# Patient Record
Sex: Male | Born: 1950 | Race: White | Hispanic: No | Marital: Married | Smoking: Never smoker
Health system: Southern US, Community
[De-identification: ages and names within clinical notes are randomized; demographics above are authoritative.]

## PROBLEM LIST (undated history)

## (undated) DIAGNOSIS — I10 Essential (primary) hypertension: Secondary | ICD-10-CM

## (undated) DIAGNOSIS — E039 Hypothyroidism, unspecified: Secondary | ICD-10-CM

## (undated) DIAGNOSIS — I2510 Atherosclerotic heart disease of native coronary artery without angina pectoris: Secondary | ICD-10-CM

## (undated) DIAGNOSIS — R0600 Dyspnea, unspecified: Secondary | ICD-10-CM

## (undated) DIAGNOSIS — M4800 Spinal stenosis, site unspecified: Secondary | ICD-10-CM

## (undated) DIAGNOSIS — E785 Hyperlipidemia, unspecified: Secondary | ICD-10-CM

## (undated) DIAGNOSIS — K219 Gastro-esophageal reflux disease without esophagitis: Secondary | ICD-10-CM

## (undated) DIAGNOSIS — I5042 Chronic combined systolic (congestive) and diastolic (congestive) heart failure: Secondary | ICD-10-CM

## (undated) DIAGNOSIS — R001 Bradycardia, unspecified: Secondary | ICD-10-CM

## (undated) DIAGNOSIS — M545 Low back pain, unspecified: Secondary | ICD-10-CM

## (undated) DIAGNOSIS — Z8619 Personal history of other infectious and parasitic diseases: Secondary | ICD-10-CM

## (undated) DIAGNOSIS — C61 Malignant neoplasm of prostate: Secondary | ICD-10-CM

## (undated) DIAGNOSIS — T50905A Adverse effect of unspecified drugs, medicaments and biological substances, initial encounter: Secondary | ICD-10-CM

## (undated) DIAGNOSIS — G8929 Other chronic pain: Secondary | ICD-10-CM

## (undated) DIAGNOSIS — M4316 Spondylolisthesis, lumbar region: Secondary | ICD-10-CM

## (undated) DIAGNOSIS — M419 Scoliosis, unspecified: Secondary | ICD-10-CM

## (undated) DIAGNOSIS — G4733 Obstructive sleep apnea (adult) (pediatric): Secondary | ICD-10-CM

## (undated) DIAGNOSIS — R0609 Other forms of dyspnea: Secondary | ICD-10-CM

## (undated) DIAGNOSIS — M1990 Unspecified osteoarthritis, unspecified site: Secondary | ICD-10-CM

## (undated) DIAGNOSIS — Q2546 Tortuous aortic arch: Secondary | ICD-10-CM

## (undated) DIAGNOSIS — I493 Ventricular premature depolarization: Secondary | ICD-10-CM

## (undated) DIAGNOSIS — J219 Acute bronchiolitis, unspecified: Secondary | ICD-10-CM

## (undated) HISTORY — DX: Spinal stenosis, site unspecified: M48.00

## (undated) HISTORY — PX: BACK SURGERY: SHX140

## (undated) HISTORY — PX: ESOPHAGOGASTRODUODENOSCOPY: SHX1529

## (undated) HISTORY — DX: Bradycardia, unspecified: R00.1

## (undated) HISTORY — DX: Scoliosis, unspecified: M41.9

## (undated) HISTORY — DX: Dyspnea, unspecified: R06.00

## (undated) HISTORY — PX: INGUINAL HERNIA REPAIR: SUR1180

## (undated) HISTORY — PX: ANGIOPLASTY: SHX39

## (undated) HISTORY — DX: Spondylolisthesis, lumbar region: M43.16

## (undated) HISTORY — PX: TONSILLECTOMY: SUR1361

## (undated) HISTORY — DX: Adverse effect of unspecified drugs, medicaments and biological substances, initial encounter: T50.905A

## (undated) HISTORY — PX: COLONOSCOPY: SHX174

## (undated) HISTORY — PX: EYE SURGERY: SHX253

## (undated) HISTORY — DX: Ventricular premature depolarization: I49.3

## (undated) HISTORY — PX: JOINT REPLACEMENT: SHX530

## (undated) HISTORY — DX: Chronic combined systolic (congestive) and diastolic (congestive) heart failure: I50.42

## (undated) HISTORY — DX: Other chronic pain: G89.29

## (undated) HISTORY — DX: Obstructive sleep apnea (adult) (pediatric): G47.33

## (undated) HISTORY — DX: Other forms of dyspnea: R06.09

---

## 2003-08-30 HISTORY — PX: CYST EXCISION: SHX5701

## 2004-10-04 ENCOUNTER — Ambulatory Visit: Payer: Self-pay | Admitting: Family Medicine

## 2004-11-25 ENCOUNTER — Ambulatory Visit: Payer: Self-pay | Admitting: Family Medicine

## 2005-02-03 ENCOUNTER — Ambulatory Visit: Payer: Self-pay | Admitting: Family Medicine

## 2005-06-10 ENCOUNTER — Ambulatory Visit: Payer: Self-pay | Admitting: Family Medicine

## 2005-10-11 ENCOUNTER — Ambulatory Visit: Payer: Self-pay | Admitting: Family Medicine

## 2005-11-29 ENCOUNTER — Ambulatory Visit: Payer: Self-pay | Admitting: Family Medicine

## 2006-02-07 ENCOUNTER — Ambulatory Visit: Payer: Self-pay | Admitting: Family Medicine

## 2006-06-14 ENCOUNTER — Ambulatory Visit: Payer: Self-pay | Admitting: Family Medicine

## 2006-06-22 ENCOUNTER — Ambulatory Visit: Payer: Self-pay | Admitting: Family Medicine

## 2006-06-26 ENCOUNTER — Ambulatory Visit: Payer: Self-pay | Admitting: Family Medicine

## 2006-10-16 ENCOUNTER — Ambulatory Visit: Payer: Self-pay | Admitting: Family Medicine

## 2006-12-11 ENCOUNTER — Ambulatory Visit: Payer: Self-pay | Admitting: Family Medicine

## 2007-01-11 ENCOUNTER — Ambulatory Visit: Payer: Self-pay | Admitting: Family Medicine

## 2007-01-25 ENCOUNTER — Ambulatory Visit: Payer: Self-pay | Admitting: Family Medicine

## 2008-03-29 ENCOUNTER — Emergency Department (HOSPITAL_COMMUNITY): Admission: EM | Admit: 2008-03-29 | Discharge: 2008-03-29 | Payer: Self-pay | Admitting: Family Medicine

## 2009-02-17 ENCOUNTER — Encounter: Admission: RE | Admit: 2009-02-17 | Discharge: 2009-02-17 | Payer: Self-pay | Admitting: Neurosurgery

## 2009-07-04 ENCOUNTER — Emergency Department (HOSPITAL_COMMUNITY): Admission: EM | Admit: 2009-07-04 | Discharge: 2009-07-04 | Payer: Self-pay | Admitting: Emergency Medicine

## 2010-08-29 HISTORY — PX: KNEE ARTHROSCOPY: SHX127

## 2010-12-01 LAB — CBC
Hemoglobin: 11.5 g/dL — ABNORMAL LOW (ref 13.0–17.0)
MCHC: 31.8 g/dL (ref 30.0–36.0)
RDW: 17.7 % — ABNORMAL HIGH (ref 11.5–15.5)

## 2010-12-01 LAB — DIFFERENTIAL
Basophils Absolute: 0 10*3/uL (ref 0.0–0.1)
Basophils Relative: 1 % (ref 0–1)
Lymphocytes Relative: 10 % — ABNORMAL LOW (ref 12–46)
Monocytes Absolute: 0.9 10*3/uL (ref 0.1–1.0)
Neutro Abs: 5.2 10*3/uL (ref 1.7–7.7)
Neutrophils Relative %: 75 % (ref 43–77)

## 2010-12-01 LAB — URINALYSIS, ROUTINE W REFLEX MICROSCOPIC
Nitrite: NEGATIVE
Specific Gravity, Urine: 1.037 — ABNORMAL HIGH (ref 1.005–1.030)
Urobilinogen, UA: 0.2 mg/dL (ref 0.0–1.0)
pH: 5.5 (ref 5.0–8.0)

## 2010-12-01 LAB — POCT I-STAT, CHEM 8
BUN: 22 mg/dL (ref 6–23)
Calcium, Ion: 1.16 mmol/L (ref 1.12–1.32)
Chloride: 108 mEq/L (ref 96–112)
Creatinine, Ser: 1 mg/dL (ref 0.4–1.5)
Glucose, Bld: 85 mg/dL (ref 70–99)
TCO2: 21 mmol/L (ref 0–100)

## 2010-12-01 LAB — URINE CULTURE: Colony Count: NO GROWTH

## 2010-12-01 LAB — GLUCOSE, CAPILLARY: Glucose-Capillary: 81 mg/dL (ref 70–99)

## 2011-08-30 HISTORY — PX: PROSTATE SURGERY: SHX751

## 2011-08-30 HISTORY — PX: PROSTATE BIOPSY: SHX241

## 2011-12-29 ENCOUNTER — Emergency Department (HOSPITAL_COMMUNITY)
Admission: EM | Admit: 2011-12-29 | Discharge: 2011-12-29 | Disposition: A | Discharge status: Home / Self Care | Payer: BC Managed Care – PPO | Attending: Emergency Medicine | Admitting: Emergency Medicine

## 2011-12-29 ENCOUNTER — Encounter (HOSPITAL_COMMUNITY): Payer: Self-pay | Admitting: Emergency Medicine

## 2011-12-29 DIAGNOSIS — R1031 Right lower quadrant pain: Secondary | ICD-10-CM | POA: Insufficient documentation

## 2011-12-29 DIAGNOSIS — C61 Malignant neoplasm of prostate: Secondary | ICD-10-CM | POA: Insufficient documentation

## 2011-12-29 DIAGNOSIS — I10 Essential (primary) hypertension: Secondary | ICD-10-CM | POA: Insufficient documentation

## 2011-12-29 HISTORY — DX: Malignant neoplasm of prostate: C61

## 2011-12-29 HISTORY — DX: Essential (primary) hypertension: I10

## 2011-12-29 LAB — DIFFERENTIAL
Basophils Absolute: 0 10*3/uL (ref 0.0–0.1)
Eosinophils Absolute: 0.1 10*3/uL (ref 0.0–0.7)
Eosinophils Relative: 2 % (ref 0–5)
Lymphocytes Relative: 29 % (ref 12–46)
Lymphs Abs: 2 10*3/uL (ref 0.7–4.0)
Monocytes Absolute: 0.7 10*3/uL (ref 0.1–1.0)

## 2011-12-29 LAB — COMPREHENSIVE METABOLIC PANEL
CO2: 24 mEq/L (ref 19–32)
Calcium: 9.8 mg/dL (ref 8.4–10.5)
Creatinine, Ser: 0.92 mg/dL (ref 0.50–1.35)
GFR calc Af Amer: >90 mL/min (ref >90)
GFR calc non Af Amer: 89 mL/min — ABNORMAL LOW (ref >90)
Glucose, Bld: 99 mg/dL (ref 70–99)
Sodium: 142 mEq/L (ref 135–145)
Total Protein: 7.5 g/dL (ref 6.0–8.3)

## 2011-12-29 LAB — CBC
HCT: 45 % (ref 39.0–52.0)
MCH: 30.1 pg (ref 26.0–34.0)
MCV: 87.4 fL (ref 78.0–100.0)
Platelets: 178 10*3/uL (ref 150–400)
RDW: 14.3 % (ref 11.5–15.5)
WBC: 6.7 10*3/uL (ref 4.0–10.5)

## 2011-12-29 LAB — URINALYSIS, ROUTINE W REFLEX MICROSCOPIC
Leukocytes, UA: NEGATIVE
Nitrite: NEGATIVE
Protein, ur: NEGATIVE mg/dL
Specific Gravity, Urine: 1.019 (ref 1.005–1.030)
Urobilinogen, UA: 0.2 mg/dL (ref 0.0–1.0)

## 2011-12-29 MED ORDER — HYDROCODONE-ACETAMINOPHEN 5-325 MG PO TABS: 1.0000 | ORAL_TABLET | Freq: Four times a day (QID) | ORAL | Status: AC | PRN

## 2011-12-29 MED ORDER — METHOCARBAMOL 500 MG PO TABS: 500.0000 mg | ORAL_TABLET | Freq: Two times a day (BID) | ORAL | Status: AC

## 2011-12-29 MED ORDER — KETOROLAC TROMETHAMINE 30 MG/ML IJ SOLN
30.0000 mg | Freq: Once | INTRAMUSCULAR | Status: AC
  Administered 2011-12-29: 30 mg via INTRAVENOUS
  Filled 2011-12-29: qty 1

## 2011-12-29 MED ORDER — ONDANSETRON HCL 4 MG/2ML IJ SOLN
4.0000 mg | Freq: Once | INTRAMUSCULAR | Status: AC
  Administered 2011-12-29: 4 mg via INTRAVENOUS
  Filled 2011-12-29: qty 2

## 2011-12-29 MED ORDER — SODIUM CHLORIDE 0.9 % IV BOLUS (SEPSIS)
1000.0000 mL | Freq: Once | INTRAVENOUS | Status: AC
  Administered 2011-12-29: 1000 mL via INTRAVENOUS

## 2011-12-29 MED ORDER — FENTANYL CITRATE 0.05 MG/ML IJ SOLN
50.0000 ug | Freq: Once | INTRAMUSCULAR | Status: AC
  Administered 2011-12-29: 50 ug via INTRAVENOUS
  Filled 2011-12-29: qty 2

## 2011-12-29 NOTE — ED Provider Notes (Signed)
History     CSN: [id number]  Arrival date & time 12/29/11  0731   First MD Initiated Contact with Patient 12/29/11 0740      8:00 AM HPI Patient reports right lower quadrant pain has been going on for several years. States pain has gradually worsened and last night pain became unbearable. States pain located in a specific spot in his lateral right lower quadrant of his abdomen. Pain is worse with palpation. Denies associated nausea, vomiting, diarrhea, fevers, urinary symptoms, diarrhea, back pain. Patient reports a history of prostate cancer; diagnosed one month ago. Reports he has not had a colonoscopy in several years and is due to have one. His significant weight loss or loss of appetite. Patient is a 61 y.o. male presenting with abdominal pain. The history is provided by the patient.  Abdominal Pain The primary symptoms of the illness include abdominal pain. The primary symptoms of the illness do not include fever, shortness of breath, nausea, vomiting, diarrhea, dysuria, vaginal discharge or vaginal bleeding. The onset of the illness was gradual. The problem has been gradually worsening.  The abdominal pain is located in the RLQ. The abdominal pain does not radiate. The abdominal pain is relieved by nothing. Exacerbated by: palpation.  The patient has not had a change in bowel habit. Symptoms associated with the illness do not include chills, heartburn, constipation, urgency, hematuria, frequency or back pain. Significant associated medical issues do not include PUD or GERD.    Past Medical History  Diagnosis Date  . Prostate cancer   . Hypertension     Past Surgical History  Procedure Date  . Knee surgery     No family history on file.  History  Substance Use Topics  . Smoking status: Never Smoker   . Smokeless tobacco: Not on file  . Alcohol Use: No      Review of Systems  Constitutional: Negative for fever and chills.  Respiratory: Negative for shortness of breath.     Cardiovascular: Negative for chest pain.  Gastrointestinal: Positive for abdominal pain. Negative for heartburn, nausea, vomiting, diarrhea, constipation, blood in stool and rectal pain.  Genitourinary: Negative for dysuria, urgency, frequency, hematuria, flank pain, discharge, vaginal bleeding, vaginal discharge, penile pain and testicular pain.  Musculoskeletal: Negative for back pain.  Neurological: Negative for dizziness, weakness, numbness and headaches.  All other systems reviewed and are negative.    Allergies  Review of patient's allergies indicates no known allergies.  Home Medications   Current Outpatient Rx  Name Route Sig Dispense Refill  . ACETAMINOPHEN-CODEINE #3 300-30 MG PO TABS Oral Take 1 tablet by mouth 2 (two) times daily.    [name] AMLODIPINE BESYLATE 5 MG PO TABS Oral Take 5 mg by mouth daily.    . ASPIRIN 81 MG PO CHEW Oral Chew 81 mg by mouth daily.    [name] COENZYME Q10 30 MG PO CAPS Oral Take 30 mg by mouth 3 (three) times daily.    [name] ESOMEPRAZOLE MAGNESIUM 40 MG PO CPDR Oral Take 40 mg by mouth daily before breakfast.    . FLAX SEED OIL PO Oral Take 3,600 mg by mouth daily.    [name] GABAPENTIN 300 MG PO CAPS Oral Take 300-600 mg by mouth 3 (three) times daily. 300 mg twice daily, then 600 mg at bedtime    . HYPROMELLOSE 2.5 % OP SOLN  1 drop.    [name] LEVOTHYROXINE SODIUM 150 MCG PO TABS Oral Take 150 mcg by mouth daily.    [name]  PITAVASTATIN CALCIUM 2 MG PO TABS Oral Take 1 mg by mouth daily.    . RED YEAST RICE 600 MG PO TABS Oral Take 1,800 mg by mouth daily.    . TRAMADOL HCL 50 MG PO TABS Oral Take 50 mg by mouth every 6 (six) hours as needed. For pain      BP 129/81  Pulse 74  Temp 97.7 F (36.5 C)  Resp 18  Ht 5\' 7"  (1.702 m)  Wt 166 lb (75.297 kg)  BMI 26.00 kg/m2  SpO2 97%  Physical Exam  Constitutional: He is oriented to person, place, and time. He appears well-developed and well-nourished.  HENT:  Head: Normocephalic and atraumatic.  Eyes: Conjunctivae  are normal. Pupils are equal, round, and reactive to light.  Neck: Normal range of motion. Neck supple.  Cardiovascular: Normal rate, regular rhythm and normal heart sounds.   Pulmonary/Chest: Effort normal and breath sounds normal.  Abdominal: Soft. Bowel sounds are normal. There is no hepatosplenomegaly. There is tenderness in the right lower quadrant. There is no rigidity, no rebound, no guarding, no CVA tenderness, no tenderness at McBurney's point and negative Murphy's sign.    Neurological: He is alert and oriented to person, place, and time.  Skin: Skin is warm and dry. No rash noted. No erythema. No pallor.  Psychiatric: He has a normal mood and affect. His behavior is normal.    ED Course  Procedures Results for orders placed during the hospital encounter of 12/29/11  CBC      Component Value Range   WBC 6.7  4.0 - 10.5 (K/uL)   RBC 5.15  4.22 - 5.81 (MIL/uL)   Hemoglobin 15.5  13.0 - 17.0 (g/dL)   HCT 16.1  09.6 - 04.5 (%)   MCV 87.4  78.0 - 100.0 (fL)   MCH 30.1  26.0 - 34.0 (pg)   MCHC 34.4  30.0 - 36.0 (g/dL)   RDW 40.9  81.1 - 91.4 (%)   Platelets 178  150 - 400 (K/uL)  DIFFERENTIAL      Component Value Range   Neutrophils Relative 57  43 - 77 (%)   Neutro Abs 3.8  1.7 - 7.7 (K/uL)   Lymphocytes Relative 29  12 - 46 (%)   Lymphs Abs 2.0  0.7 - 4.0 (K/uL)   Monocytes Relative 11  3 - 12 (%)   Monocytes Absolute 0.7  0.1 - 1.0 (K/uL)   Eosinophils Relative 2  0 - 5 (%)   Eosinophils Absolute 0.1  0.0 - 0.7 (K/uL)   Basophils Relative 0  0 - 1 (%)   Basophils Absolute 0.0  0.0 - 0.1 (K/uL)  COMPREHENSIVE METABOLIC PANEL      Component Value Range   Sodium 142  135 - 145 (mEq/L)   Potassium 4.4  3.5 - 5.1 (mEq/L)   Chloride 107  96 - 112 (mEq/L)   CO2 24  19 - 32 (mEq/L)   Glucose, Bld 99  70 - 99 (mg/dL)   BUN 19  6 - 23 (mg/dL)   Creatinine, Ser 7.82  0.50 - 1.35 (mg/dL)   Calcium 9.8  8.4 - 95.6 (mg/dL)   Total Protein 7.5  6.0 - 8.3 (g/dL)   Albumin 3.9   3.5 - 5.2 (g/dL)   AST 19  0 - 37 (U/L)   ALT 19  0 - 53 (U/L)   Alkaline Phosphatase 59  39 - 117 (U/L)   Total Bilirubin 0.9  0.3 - 1.2 (  mg/dL)   GFR calc non Af Amer 89 (*) >90 (mL/min)   GFR calc Af Amer >90  >90 (mL/min)  LIPASE, BLOOD      Component Value Range   Lipase 60 (*) 11 - 59 (U/L)  URINALYSIS, ROUTINE W REFLEX MICROSCOPIC      Component Value Range   Color, Urine YELLOW  YELLOW    APPearance CLEAR  CLEAR    Specific Gravity, Urine 1.019  1.005 - 1.030    pH 6.0  5.0 - 8.0    Glucose, UA NEGATIVE  NEGATIVE (mg/dL)   Hgb urine dipstick NEGATIVE  NEGATIVE    Bilirubin Urine NEGATIVE  NEGATIVE    Ketones, ur NEGATIVE  NEGATIVE (mg/dL)   Protein, ur NEGATIVE  NEGATIVE (mg/dL)   Urobilinogen, UA 0.2  0.0 - 1.0 (mg/dL)   Nitrite NEGATIVE  NEGATIVE    Leukocytes, UA NEGATIVE  NEGATIVE    No results found.     MDM   11:11 AM Patient reports pain is significantly improved after medication. Labs are all within normal limits. Do not suspect acute cause of abdominal pain his pain has been ongoing for several years. However did recommend patient should followup with the GI physician have a repeat colonoscopy and rule out colon cancer. Patient voices understanding and will follow up his primary care doctor to schedule GI followup. I advised patient that I would also write a referral for GI physician. Patient voices understanding and is ready for discharge.     Thomasene Lot, PA-C 12/29/11 1112

## 2011-12-29 NOTE — ED Provider Notes (Signed)
Medical screening examination/treatment/procedure(s) were performed by non-physician practitioner and as supervising physician I was immediately available for consultation/collaboration.   Carleene Cooper III, MD 12/29/11 [phone]

## 2011-12-29 NOTE — ED Notes (Signed)
Pt reports right sided abd pain X several years, reports pain is worse recently, also reports recent prostate cancer dx, no V/D/F, c/o some dysuria, NAD

## 2011-12-29 NOTE — Discharge Instructions (Signed)

## 2012-01-04 ENCOUNTER — Other Ambulatory Visit (HOSPITAL_COMMUNITY): Payer: Self-pay | Admitting: Family Medicine

## 2012-01-04 DIAGNOSIS — R1031 Right lower quadrant pain: Secondary | ICD-10-CM

## 2012-01-04 DIAGNOSIS — C61 Malignant neoplasm of prostate: Secondary | ICD-10-CM

## 2012-01-06 ENCOUNTER — Encounter (HOSPITAL_COMMUNITY): Payer: Self-pay

## 2012-01-06 ENCOUNTER — Ambulatory Visit (HOSPITAL_COMMUNITY)
Admission: RE | Admit: 2012-01-06 | Discharge: 2012-01-06 | Disposition: A | Discharge status: Home / Self Care | Payer: BC Managed Care – PPO | Source: Ambulatory Visit | Attending: Family Medicine | Admitting: Family Medicine

## 2012-01-06 DIAGNOSIS — R1031 Right lower quadrant pain: Secondary | ICD-10-CM

## 2012-01-06 DIAGNOSIS — K4090 Unilateral inguinal hernia, without obstruction or gangrene, not specified as recurrent: Secondary | ICD-10-CM | POA: Insufficient documentation

## 2012-01-06 DIAGNOSIS — C61 Malignant neoplasm of prostate: Secondary | ICD-10-CM | POA: Insufficient documentation

## 2012-01-06 DIAGNOSIS — K5730 Diverticulosis of large intestine without perforation or abscess without bleeding: Secondary | ICD-10-CM | POA: Insufficient documentation

## 2012-01-06 DIAGNOSIS — R1011 Right upper quadrant pain: Secondary | ICD-10-CM | POA: Insufficient documentation

## 2012-01-06 DIAGNOSIS — R911 Solitary pulmonary nodule: Secondary | ICD-10-CM | POA: Insufficient documentation

## 2012-01-06 MED ORDER — IOHEXOL 300 MG/ML  SOLN
100.0000 mL | Freq: Once | INTRAMUSCULAR | Status: AC | PRN
  Administered 2012-01-06: 100 mL via INTRAVENOUS

## 2013-01-17 ENCOUNTER — Other Ambulatory Visit: Payer: Self-pay | Admitting: Neurological Surgery

## 2013-02-26 HISTORY — PX: POSTERIOR LUMBAR FUSION: SHX6036

## 2013-02-27 ENCOUNTER — Encounter (HOSPITAL_COMMUNITY): Payer: Self-pay | Admitting: Pharmacy Technician

## 2013-03-05 ENCOUNTER — Encounter (HOSPITAL_COMMUNITY)
Admission: RE | Admit: 2013-03-05 | Discharge: 2013-03-05 | Disposition: A | Discharge status: Home / Self Care | Payer: BC Managed Care – PPO | Source: Ambulatory Visit | Attending: Anesthesiology | Admitting: Anesthesiology

## 2013-03-05 ENCOUNTER — Encounter (HOSPITAL_COMMUNITY)
Admission: RE | Admit: 2013-03-05 | Discharge: 2013-03-05 | Disposition: A | Discharge status: Home / Self Care | Payer: BC Managed Care – PPO | Source: Ambulatory Visit | Attending: Neurological Surgery | Admitting: Neurological Surgery

## 2013-03-05 ENCOUNTER — Encounter (HOSPITAL_COMMUNITY): Payer: Self-pay

## 2013-03-05 DIAGNOSIS — Z01818 Encounter for other preprocedural examination: Secondary | ICD-10-CM | POA: Insufficient documentation

## 2013-03-05 DIAGNOSIS — Z01812 Encounter for preprocedural laboratory examination: Secondary | ICD-10-CM | POA: Insufficient documentation

## 2013-03-05 HISTORY — DX: Gastro-esophageal reflux disease without esophagitis: K21.9

## 2013-03-05 HISTORY — DX: Hyperlipidemia, unspecified: E78.5

## 2013-03-05 HISTORY — DX: Hypothyroidism, unspecified: E03.9

## 2013-03-05 HISTORY — DX: Unspecified osteoarthritis, unspecified site: M19.90

## 2013-03-05 HISTORY — DX: Personal history of other infectious and parasitic diseases: Z86.19

## 2013-03-05 LAB — SURGICAL PCR SCREEN
MRSA, PCR: NEGATIVE
Staphylococcus aureus: NEGATIVE

## 2013-03-05 LAB — BASIC METABOLIC PANEL
CO2: 28 mEq/L (ref 19–32)
Chloride: 106 mEq/L (ref 96–112)
GFR calc Af Amer: >90 mL/min (ref >90)
Sodium: 140 mEq/L (ref 135–145)

## 2013-03-05 LAB — CBC
Platelets: 175 10*3/uL (ref 150–400)
RBC: 5.03 MIL/uL (ref 4.22–5.81)
RDW: 13 % (ref 11.5–15.5)
WBC: 6.1 10*3/uL (ref 4.0–10.5)

## 2013-03-05 LAB — ABO/RH: ABO/RH(D): A POS

## 2013-03-05 LAB — TYPE AND SCREEN
ABO/RH(D): A POS
Antibody Screen: NEGATIVE

## 2013-03-05 NOTE — Progress Notes (Signed)
Pt doesn't have a cardiologist but did see one at least 61yrs ago bc of EKG "looking different"  Echo done about 30yrs ago  Denies ever having a heart cath or stress test  Dr.Nyland is Medical MD  EKG to be requested from Dr.Nyland  Denies CXR in past yr

## 2013-03-05 NOTE — Pre-Procedure Instructions (Signed)
Duane Reyes  03/05/2013   Your procedure is scheduled on:  Tues, July 15 @ 7:30 AM  Report to [facility] at 5:30 AM.  Call this number if you have problems the morning of surgery: [phone]   Remember:   Do not eat food or drink liquids after midnight.   Take these medicines the morning of surgery with A SIP OF WATER: Amlodipine(Norvasc),Nexium(Esomeprazole),Gabapentin(Neurontin),Pain Pill(if needed),and Levothyroxine(Synthroid)              Stop taking your Aspirin,CO Q10,Lovaza,and Ibuprofen.No Goody's,Aleve,BC's,or any Herbal Medications   Do not wear jewelry  Do not wear lotions, powders, or colognes. You may wear deodorant.  Men may shave face and neck.  Do not bring valuables to the hospital.  [facility] is not responsible                   for any belongings or valuables.  Contacts, dentures or bridgework may not be worn into surgery.  Leave suitcase in the car. After surgery it may be brought to your room.  For patients admitted to the hospital, checkout time is 11:00 AM the day of  discharge.   Special Instructions: Shower using CHG 2 nights before surgery and the night before surgery.  If you shower the day of surgery use CHG.  Use special wash - you have one bottle of CHG for all showers.  You should use approximately 1/3 of the bottle for each shower.   Please read over the following fact sheets that you were given: Pain Booklet, Coughing and Deep Breathing, Blood Transfusion Information, MRSA Information and Surgical Site Infection Prevention

## 2013-03-11 MED ORDER — CEFAZOLIN SODIUM-DEXTROSE 2-3 GM-% IV SOLR
2.0000 g | INTRAVENOUS | Status: AC
  Administered 2013-03-12: 2 g via INTRAVENOUS
  Filled 2013-03-11: qty 50

## 2013-03-12 ENCOUNTER — Encounter (HOSPITAL_COMMUNITY): Payer: Self-pay | Admitting: Surgery

## 2013-03-12 ENCOUNTER — Encounter (HOSPITAL_COMMUNITY): Payer: Self-pay | Admitting: Anesthesiology

## 2013-03-12 ENCOUNTER — Encounter (HOSPITAL_COMMUNITY)
Admission: RE | Disposition: A | Discharge status: Home / Self Care | Payer: Self-pay | Source: Ambulatory Visit | Attending: Neurological Surgery

## 2013-03-12 ENCOUNTER — Inpatient Hospital Stay (HOSPITAL_COMMUNITY): Payer: BC Managed Care – PPO | Admitting: Anesthesiology

## 2013-03-12 ENCOUNTER — Inpatient Hospital Stay (HOSPITAL_COMMUNITY)
Admission: RE | Admit: 2013-03-12 | Discharge: 2013-03-16 | DRG: 756 | Disposition: A | Discharge status: Home / Self Care | Payer: BC Managed Care – PPO | Source: Ambulatory Visit | Attending: Neurological Surgery | Admitting: Neurological Surgery

## 2013-03-12 ENCOUNTER — Inpatient Hospital Stay (HOSPITAL_COMMUNITY): Payer: BC Managed Care – PPO

## 2013-03-12 DIAGNOSIS — E079 Disorder of thyroid, unspecified: Secondary | ICD-10-CM | POA: Diagnosis present

## 2013-03-12 DIAGNOSIS — M4316 Spondylolisthesis, lumbar region: Secondary | ICD-10-CM | POA: Diagnosis present

## 2013-03-12 DIAGNOSIS — K5900 Constipation, unspecified: Secondary | ICD-10-CM | POA: Diagnosis not present

## 2013-03-12 DIAGNOSIS — M48062 Spinal stenosis, lumbar region with neurogenic claudication: Principal | ICD-10-CM | POA: Diagnosis present

## 2013-03-12 DIAGNOSIS — K219 Gastro-esophageal reflux disease without esophagitis: Secondary | ICD-10-CM | POA: Diagnosis present

## 2013-03-12 DIAGNOSIS — Z7982 Long term (current) use of aspirin: Secondary | ICD-10-CM

## 2013-03-12 DIAGNOSIS — Q762 Congenital spondylolisthesis: Secondary | ICD-10-CM

## 2013-03-12 DIAGNOSIS — Z8546 Personal history of malignant neoplasm of prostate: Secondary | ICD-10-CM

## 2013-03-12 SURGERY — POSTERIOR LUMBAR FUSION 2 LEVEL
Anesthesia: General | Site: Back | Wound class: Clean

## 2013-03-12 MED ORDER — BISACODYL 10 MG RE SUPP
10.0000 mg | Freq: Every day | RECTAL | Status: DC | PRN
  Administered 2013-03-16: 10 mg via RECTAL
  Filled 2013-03-12: qty 1

## 2013-03-12 MED ORDER — GLYCOPYRROLATE 0.2 MG/ML IJ SOLN
INTRAMUSCULAR | Status: DC | PRN
  Administered 2013-03-12: 0.6 mg via INTRAVENOUS

## 2013-03-12 MED ORDER — PHENYLEPHRINE HCL 10 MG/ML IJ SOLN
INTRAMUSCULAR | Status: DC | PRN
  Administered 2013-03-12: 80 ug via INTRAVENOUS
  Administered 2013-03-12: 40 ug via INTRAVENOUS

## 2013-03-12 MED ORDER — SODIUM CHLORIDE 0.9 % IV SOLN: INTRAVENOUS | Status: DC

## 2013-03-12 MED ORDER — PROPOFOL 10 MG/ML IV BOLUS
INTRAVENOUS | Status: DC | PRN
  Administered 2013-03-12: 200 mg via INTRAVENOUS

## 2013-03-12 MED ORDER — DOCUSATE SODIUM 100 MG PO CAPS
100.0000 mg | ORAL_CAPSULE | Freq: Two times a day (BID) | ORAL | Status: DC
  Administered 2013-03-12 – 2013-03-15 (×7): 100 mg via ORAL
  Filled 2013-03-12 (×7): qty 1

## 2013-03-12 MED ORDER — SODIUM CHLORIDE 0.9 % IV SOLN: 250.0000 mL | INTRAVENOUS | Status: DC

## 2013-03-12 MED ORDER — ALUM & MAG HYDROXIDE-SIMETH 200-200-20 MG/5ML PO SUSP: 30.0000 mL | Freq: Four times a day (QID) | ORAL | Status: DC | PRN

## 2013-03-12 MED ORDER — BUPIVACAINE HCL (PF) 0.5 % IJ SOLN
INTRAMUSCULAR | Status: DC | PRN
  Administered 2013-03-12: 20 mL
  Administered 2013-03-12: 10 mL

## 2013-03-12 MED ORDER — POLYETHYLENE GLYCOL 3350 17 G PO PACK
17.0000 g | PACK | Freq: Every day | ORAL | Status: DC | PRN
  Administered 2013-03-15: 17 g via ORAL
  Filled 2013-03-12: qty 1

## 2013-03-12 MED ORDER — CEFAZOLIN SODIUM 1-5 GM-% IV SOLN
1.0000 g | Freq: Three times a day (TID) | INTRAVENOUS | Status: AC
  Administered 2013-03-12 – 2013-03-13 (×2): 1 g via INTRAVENOUS
  Filled 2013-03-12 (×2): qty 50

## 2013-03-12 MED ORDER — CEFAZOLIN SODIUM 1-5 GM-% IV SOLN
INTRAVENOUS | Status: AC
  Administered 2013-03-12: 1 g via INTRAVENOUS
  Filled 2013-03-12: qty 50

## 2013-03-12 MED ORDER — ONDANSETRON HCL 4 MG/2ML IJ SOLN: 4.0000 mg | INTRAMUSCULAR | Status: DC | PRN

## 2013-03-12 MED ORDER — BACITRACIN 50000 UNITS IM SOLR
INTRAMUSCULAR | Status: AC
  Filled 2013-03-12: qty 1

## 2013-03-12 MED ORDER — SODIUM CHLORIDE 0.9 % IJ SOLN
3.0000 mL | Freq: Two times a day (BID) | INTRAMUSCULAR | Status: DC
  Administered 2013-03-12 – 2013-03-15 (×6): 3 mL via INTRAVENOUS

## 2013-03-12 MED ORDER — SODIUM CHLORIDE 0.9 % IJ SOLN: 3.0000 mL | INTRAMUSCULAR | Status: DC | PRN

## 2013-03-12 MED ORDER — VECURONIUM BROMIDE 10 MG IV SOLR
INTRAVENOUS | Status: DC | PRN
  Administered 2013-03-12 (×2): 2 mg via INTRAVENOUS
  Administered 2013-03-12: 3 mg via INTRAVENOUS

## 2013-03-12 MED ORDER — ROCURONIUM BROMIDE 100 MG/10ML IV SOLN
INTRAVENOUS | Status: DC | PRN
  Administered 2013-03-12: 10 mg via INTRAVENOUS
  Administered 2013-03-12: 40 mg via INTRAVENOUS

## 2013-03-12 MED ORDER — METHOCARBAMOL 100 MG/ML IJ SOLN
500.0000 mg | Freq: Four times a day (QID) | INTRAVENOUS | Status: DC | PRN
  Filled 2013-03-12: qty 5

## 2013-03-12 MED ORDER — HYPROMELLOSE (GONIOSCOPIC) 2.5 % OP SOLN
1.0000 [drp] | Freq: Three times a day (TID) | OPHTHALMIC | Status: DC | PRN
  Filled 2013-03-12: qty 15

## 2013-03-12 MED ORDER — GABAPENTIN 300 MG PO CAPS
300.0000 mg | ORAL_CAPSULE | Freq: Three times a day (TID) | ORAL | Status: DC
  Filled 2013-03-12: qty 2

## 2013-03-12 MED ORDER — SODIUM CHLORIDE 0.9 % IV SOLN
INTRAVENOUS | Status: AC
  Filled 2013-03-12: qty 500

## 2013-03-12 MED ORDER — ONDANSETRON HCL 4 MG/2ML IJ SOLN: 4.0000 mg | Freq: Once | INTRAMUSCULAR | Status: DC | PRN

## 2013-03-12 MED ORDER — ARTIFICIAL TEARS OP OINT
TOPICAL_OINTMENT | OPHTHALMIC | Status: DC | PRN
  Administered 2013-03-12: 1 via OPHTHALMIC

## 2013-03-12 MED ORDER — MENTHOL 3 MG MT LOZG
1.0000 | LOZENGE | OROMUCOSAL | Status: DC | PRN
  Administered 2013-03-12: 3 mg via ORAL
  Filled 2013-03-12: qty 9

## 2013-03-12 MED ORDER — LEVOTHYROXINE SODIUM 175 MCG PO TABS
175.0000 ug | ORAL_TABLET | Freq: Every day | ORAL | Status: DC
  Administered 2013-03-13 – 2013-03-16 (×4): 175 ug via ORAL
  Filled 2013-03-12 (×5): qty 1

## 2013-03-12 MED ORDER — PHENOL 1.4 % MT LIQD: 1.0000 | OROMUCOSAL | Status: DC | PRN

## 2013-03-12 MED ORDER — HYDROMORPHONE HCL PF 1 MG/ML IJ SOLN
INTRAMUSCULAR | Status: AC
  Filled 2013-03-12: qty 1

## 2013-03-12 MED ORDER — THROMBIN 20000 UNITS EX SOLR
CUTANEOUS | Status: DC | PRN
  Administered 2013-03-12: 08:00:00 via TOPICAL

## 2013-03-12 MED ORDER — NEOSTIGMINE METHYLSULFATE 1 MG/ML IJ SOLN
INTRAMUSCULAR | Status: DC | PRN
Start: 2013-03-12 — End: 2013-03-12
  Administered 2013-03-12: 4 mg via INTRAVENOUS

## 2013-03-12 MED ORDER — AMLODIPINE BESYLATE 5 MG PO TABS
5.0000 mg | ORAL_TABLET | Freq: Every day | ORAL | Status: DC
  Administered 2013-03-13 – 2013-03-15 (×3): 5 mg via ORAL
  Filled 2013-03-12 (×4): qty 1

## 2013-03-12 MED ORDER — GABAPENTIN 300 MG PO CAPS
600.0000 mg | ORAL_CAPSULE | Freq: Every day | ORAL | Status: DC
  Administered 2013-03-12 – 2013-03-15 (×4): 600 mg via ORAL
  Filled 2013-03-12 (×6): qty 2

## 2013-03-12 MED ORDER — SENNA 8.6 MG PO TABS
1.0000 | ORAL_TABLET | Freq: Two times a day (BID) | ORAL | Status: DC
  Administered 2013-03-13 – 2013-03-15 (×6): 8.6 mg via ORAL
  Filled 2013-03-12 (×10): qty 1

## 2013-03-12 MED ORDER — HYDROMORPHONE HCL PF 1 MG/ML IJ SOLN
0.2500 mg | INTRAMUSCULAR | Status: DC | PRN
  Administered 2013-03-12: 0.5 mg via INTRAVENOUS

## 2013-03-12 MED ORDER — MORPHINE SULFATE 2 MG/ML IJ SOLN
1.0000 mg | INTRAMUSCULAR | Status: DC | PRN
  Administered 2013-03-12 (×3): 2 mg via INTRAVENOUS
  Filled 2013-03-12 (×3): qty 1

## 2013-03-12 MED ORDER — ACETAMINOPHEN 650 MG RE SUPP: 650.0000 mg | RECTAL | Status: DC | PRN

## 2013-03-12 MED ORDER — LACTATED RINGERS IV SOLN
INTRAVENOUS | Status: DC | PRN
  Administered 2013-03-12 (×4): via INTRAVENOUS

## 2013-03-12 MED ORDER — SUFENTANIL CITRATE 50 MCG/ML IV SOLN
INTRAVENOUS | Status: DC | PRN
  Administered 2013-03-12: 10 ug via INTRAVENOUS
  Administered 2013-03-12: 5 ug via INTRAVENOUS
  Administered 2013-03-12 (×2): 10 ug via INTRAVENOUS
  Administered 2013-03-12: 5 ug via INTRAVENOUS
  Administered 2013-03-12 (×2): 10 ug via INTRAVENOUS

## 2013-03-12 MED ORDER — ONDANSETRON HCL 4 MG/2ML IJ SOLN
INTRAMUSCULAR | Status: DC | PRN
  Administered 2013-03-12: 4 mg via INTRAVENOUS

## 2013-03-12 MED ORDER — KETOROLAC TROMETHAMINE 15 MG/ML IJ SOLN
15.0000 mg | Freq: Four times a day (QID) | INTRAMUSCULAR | Status: AC
  Administered 2013-03-13 (×5): 15 mg via INTRAVENOUS
  Filled 2013-03-12 (×5): qty 1

## 2013-03-12 MED ORDER — POLYVINYL ALCOHOL 1.4 % OP SOLN
1.0000 [drp] | Freq: Three times a day (TID) | OPHTHALMIC | Status: DC | PRN
  Filled 2013-03-12: qty 15

## 2013-03-12 MED ORDER — FLEET ENEMA 7-19 GM/118ML RE ENEM: 1.0000 | ENEMA | Freq: Once | RECTAL | Status: AC | PRN

## 2013-03-12 MED ORDER — GABAPENTIN 300 MG PO CAPS
300.0000 mg | ORAL_CAPSULE | Freq: Two times a day (BID) | ORAL | Status: DC
  Administered 2013-03-13 – 2013-03-15 (×5): 300 mg via ORAL
  Filled 2013-03-12 (×9): qty 1

## 2013-03-12 MED ORDER — LIDOCAINE HCL (CARDIAC) 20 MG/ML IV SOLN
INTRAVENOUS | Status: DC | PRN
  Administered 2013-03-12: 60 mg via INTRAVENOUS

## 2013-03-12 MED ORDER — OXYCODONE-ACETAMINOPHEN 5-325 MG PO TABS
1.0000 | ORAL_TABLET | ORAL | Status: DC | PRN
  Administered 2013-03-12 – 2013-03-13 (×7): 2 via ORAL
  Administered 2013-03-14: 1 via ORAL
  Administered 2013-03-14 – 2013-03-16 (×8): 2 via ORAL
  Filled 2013-03-12 (×16): qty 2

## 2013-03-12 MED ORDER — ACETAMINOPHEN 325 MG PO TABS: 650.0000 mg | ORAL_TABLET | ORAL | Status: DC | PRN

## 2013-03-12 MED ORDER — ALBUMIN HUMAN 5 % IV SOLN
INTRAVENOUS | Status: DC | PRN
  Administered 2013-03-12: 11:00:00 via INTRAVENOUS

## 2013-03-12 MED ORDER — LIDOCAINE-EPINEPHRINE 1 %-1:100000 IJ SOLN
INTRAMUSCULAR | Status: DC | PRN
  Administered 2013-03-12: 10 mL

## 2013-03-12 MED ORDER — PANTOPRAZOLE SODIUM 40 MG PO TBEC
80.0000 mg | DELAYED_RELEASE_TABLET | Freq: Every day | ORAL | Status: DC
  Administered 2013-03-13 – 2013-03-16 (×4): 80 mg via ORAL
  Filled 2013-03-12 (×4): qty 2

## 2013-03-12 MED ORDER — SODIUM CHLORIDE 0.9 % IR SOLN
Status: DC | PRN
  Administered 2013-03-12: 08:00:00

## 2013-03-12 SURGICAL SUPPLY — 68 items
ADH SKN CLS APL DERMABOND .7 (GAUZE/BANDAGES/DRESSINGS) ×2
BAG DECANTER FOR FLEXI CONT (MISCELLANEOUS) ×2 IMPLANT
BLADE SURG ROTATE 9660 (MISCELLANEOUS) IMPLANT
BUR MATCHSTICK NEURO 3.0 LAGG (BURR) ×2 IMPLANT
CAGE PLIF 10MM (Cage) ×2 IMPLANT
CANISTER SUCTION 2500CC (MISCELLANEOUS) ×2 IMPLANT
CLOTH BEACON ORANGE TIMEOUT ST (SAFETY) ×2 IMPLANT
CONT SPEC 4OZ CLIKSEAL STRL BL (MISCELLANEOUS) ×5 IMPLANT
COVER BACK TABLE 24X17X13 BIG (DRAPES) IMPLANT
COVER TABLE BACK 60X90 (DRAPES) ×2 IMPLANT
CROSSLINK MEDIUM (Cage) ×1 IMPLANT
DECANTER SPIKE VIAL GLASS SM (MISCELLANEOUS) ×2 IMPLANT
DERMABOND ADVANCED (GAUZE/BANDAGES/DRESSINGS) ×2
DERMABOND ADVANCED .7 DNX12 (GAUZE/BANDAGES/DRESSINGS) ×1 IMPLANT
DRAPE C-ARM 42X72 X-RAY (DRAPES) ×4 IMPLANT
DRAPE LAPAROTOMY 100X72X124 (DRAPES) ×2 IMPLANT
DRAPE POUCH INSTRU U-SHP 10X18 (DRAPES) ×2 IMPLANT
DRAPE PROXIMA HALF (DRAPES) IMPLANT
DURAPREP 26ML APPLICATOR (WOUND CARE) ×2 IMPLANT
ELECT REM PT RETURN 9FT ADLT (ELECTROSURGICAL) ×2
ELECTRODE REM PT RTRN 9FT ADLT (ELECTROSURGICAL) ×1 IMPLANT
GAUZE SPONGE 4X4 16PLY XRAY LF (GAUZE/BANDAGES/DRESSINGS) ×1 IMPLANT
GLOVE BIOGEL PI IND STRL 7.5 (GLOVE) IMPLANT
GLOVE BIOGEL PI IND STRL 8.5 (GLOVE) ×2 IMPLANT
GLOVE BIOGEL PI INDICATOR 7.5 (GLOVE) ×1
GLOVE BIOGEL PI INDICATOR 8.5 (GLOVE) ×4
GLOVE ECLIPSE 7.5 STRL STRAW (GLOVE) ×2 IMPLANT
GLOVE ECLIPSE 8.5 STRL (GLOVE) ×4 IMPLANT
GLOVE EXAM NITRILE LRG STRL (GLOVE) IMPLANT
GLOVE EXAM NITRILE MD LF STRL (GLOVE) IMPLANT
GLOVE EXAM NITRILE XL STR (GLOVE) IMPLANT
GLOVE EXAM NITRILE XS STR PU (GLOVE) IMPLANT
GLOVE SURG SS PI 8.0 STRL IVOR (GLOVE) ×4 IMPLANT
GOWN BRE IMP SLV AUR LG STRL (GOWN DISPOSABLE) IMPLANT
GOWN BRE IMP SLV AUR XL STRL (GOWN DISPOSABLE) ×1 IMPLANT
GOWN STRL REIN 2XL LVL4 (GOWN DISPOSABLE) ×6 IMPLANT
KIT BASIN OR (CUSTOM PROCEDURE TRAY) ×2 IMPLANT
KIT ROOM TURNOVER OR (KITS) ×2 IMPLANT
MILL MEDIUM DISP (BLADE) ×1 IMPLANT
NDL SPNL 18GX3.5 QUINCKE PK (NEEDLE) IMPLANT
NEEDLE HYPO 22GX1.5 SAFETY (NEEDLE) ×2 IMPLANT
NEEDLE SPNL 18GX3.5 QUINCKE PK (NEEDLE) IMPLANT
NS IRRIG 1000ML POUR BTL (IV SOLUTION) ×2 IMPLANT
PACK FOAM VITOSS 10CC (Orthopedic Implant) ×1 IMPLANT
PACK LAMINECTOMY NEURO (CUSTOM PROCEDURE TRAY) ×2 IMPLANT
PAD ARMBOARD 7.5X6 YLW CONV (MISCELLANEOUS) ×6 IMPLANT
PATTIES SURGICAL .5 X1 (DISPOSABLE) ×2 IMPLANT
PATTIES SURGICAL 1X1 (DISPOSABLE) ×1 IMPLANT
PEEK PLIF NOVEL 9X25X12 (Peek) ×2 IMPLANT
ROD TI 5.5MM 5CM (Rod) ×2 IMPLANT
SCREW 55MM (Screw) ×2 IMPLANT
SCREW POLYAX 6.5X45MM (Screw) ×2 IMPLANT
SCREW SET SPINAL STD HEXALOBE (Screw) ×6 IMPLANT
SCREW SPINAL 6.5MMX50MM (Screw) ×2 IMPLANT
SPONGE GAUZE 4X4 12PLY (GAUZE/BANDAGES/DRESSINGS) ×2 IMPLANT
SPONGE LAP 4X18 X RAY DECT (DISPOSABLE) IMPLANT
SPONGE SURGIFOAM ABS GEL 100 (HEMOSTASIS) ×2 IMPLANT
SUT VIC AB 1 CT1 18XBRD ANBCTR (SUTURE) ×1 IMPLANT
SUT VIC AB 1 CT1 8-18 (SUTURE) ×2
SUT VIC AB 2-0 CP2 18 (SUTURE) ×2 IMPLANT
SUT VIC AB 3-0 SH 8-18 (SUTURE) ×3 IMPLANT
SYR 20ML ECCENTRIC (SYRINGE) ×2 IMPLANT
SYR 5ML LL (SYRINGE) IMPLANT
TOWEL OR 17X24 6PK STRL BLUE (TOWEL DISPOSABLE) ×2 IMPLANT
TOWEL OR 17X26 10 PK STRL BLUE (TOWEL DISPOSABLE) ×2 IMPLANT
TRAP SPECIMEN MUCOUS 40CC (MISCELLANEOUS) ×2 IMPLANT
TRAY FOLEY CATH 14FRSI W/METER (CATHETERS) ×2 IMPLANT
WATER STERILE IRR 1000ML POUR (IV SOLUTION) ×2 IMPLANT

## 2013-03-12 NOTE — Anesthesia Postprocedure Evaluation (Signed)
  Anesthesia Post-op Note  Patient: Duane Reyes  Procedure(s) Performed: Procedure(s) with comments: LUMBAR FOUR-FIVE, LUMBAR FIVE-SACRAL ONE POSTERIOR LUMBAR FUSION 2 LEVEL (N/A) - L4-5 L5-S1 Sublaminar decompression with posterior lumbar interbody fusion,pedicle screw fixation L4-S1  Patient Location: PACU  Anesthesia Type:General  Level of Consciousness: awake, oriented and patient cooperative  Airway and Oxygen Therapy: Patient Spontanous Breathing  Post-op Pain: mild  Post-op Assessment: Post-op Vital signs reviewed, Patient's Cardiovascular Status Stable, Respiratory Function Stable, Patent Airway, No signs of Nausea or vomiting and Pain level controlled  Post-op Vital Signs: stable  Complications: No apparent anesthesia complications

## 2013-03-12 NOTE — Progress Notes (Signed)
Patient ID: Duane Reyes, male   DOB: 10/02/1950, 62 y.o.   MRN: [id number] I will signs are stable. Patient is feeling some back pain now. Legs feel stable. Incision is clean and dry no obvious intolerances. Will try some Toradol for pain control and add Robaxin IV.

## 2013-03-12 NOTE — Transfer of Care (Signed)
Immediate Anesthesia Transfer of Care Note  Patient: Duane Reyes  Procedure(s) Performed: Procedure(s) with comments: LUMBAR FOUR-FIVE, LUMBAR FIVE-SACRAL ONE POSTERIOR LUMBAR FUSION 2 LEVEL (N/A) - L4-5 L5-S1 Sublaminar decompression with posterior lumbar interbody fusion,pedicle screw fixation L4-S1  Patient Location: PACU  Anesthesia Type:General  Level of Consciousness: awake, oriented and patient cooperative  Airway & Oxygen Therapy: Patient Spontanous Breathing and Patient connected to nasal cannula oxygen  Post-op Assessment: Report given to PACU RN and Post -op Vital signs reviewed and stable  Post vital signs: Reviewed and stable  Complications: No apparent anesthesia complications

## 2013-03-12 NOTE — Progress Notes (Signed)
Orthopedic Tech Progress Note Patient Details:  Duane Reyes 01/02/1951 [id number]  Patient ID: Duane Reyes, male   DOB: 13-Feb-1951, 62 y.o.   MRN: [id number] RN reported that pt already has aspen lumbar fusion brace in room that Dr. Barnett Abu ordered  Okey Dupre, Julitza Rickles 03/12/2013, 4:20 PM

## 2013-03-12 NOTE — Progress Notes (Signed)
   CARE MANAGEMENT NOTE 03/12/2013  Patient:  Duane Reyes, Duane Reyes   Account Number:  [id number]  Date Initiated:  03/12/2013  Documentation initiated by:  Jiles Crocker  Subjective/Objective Assessment:   ADMITTED FOR SURGERY - LUMBAR FOUR-FIVE, LUMBAR FIVE-SACRAL ONE POSTERIOR LUMBAR FUSION 2 LEVEL     Action/Plan:   LIVES AT HOME WITH SPOUSE; CM FOLLOWING FOR DCP   Anticipated DC Date:  03/16/2013   Anticipated DC Plan:  HOME/SELF CARE      DC Planning Services  CM consult             Status of service:  In process, will continue to follow Medicare Important Message given?  NA - LOS <3 / Initial given by admissions (If response is "NO", the following Medicare IM given date fields will be blank)  Per UR Regulation:  Reviewed for med. necessity/level of care/duration of stay  Comments:  03/12/2013- B Traycen Goyer RN,BSN,MHA

## 2013-03-12 NOTE — H&P (Signed)
CHIEF COMPLAINT:                                          Back and right lower extremity pain for years.  HISTORY OF PRESENT ILLNESS:                     [name] is a 62 year old right handed individual who was referred via Duane courtesy of Dr. Loyal Jacobson.  Duane Reyes tells me that he has had problems for at least 10 years' time and it has been increasing in severity involving his low back, which worsens with prolonged standing or walking.  He develops pain and numbness into Duane right leg and foot.  He has been seen and evaluated and treated extensively by Dr. Otelia Sergeant and Dr. Murray Hodgkins in Duane past.  In addition, he has had more recent treatments by Dr. Naaman Plummer.  He notes that his response to these treatments has become less and less over time.  Now, he notes that walking a distance of about 600 feet tends to cause such pain and disability that he has to stop and rest, usually by sitting down.  He notes that sleeping and resting at night is also uncomfortable, particularly with pain in Duane buttock and right lower extremity.  Duane symptoms have been increasingly intolerable.   DATA:                                                              Recently, he had an MRI of Duane lumbar spine that was completed on 12/07/2012.  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  He has a dysplastic L5 vertebra which creates a substantial stenosis at Duane level of L5-S1 for Duane central canal, particularly from Duane right lateral recess for Duane L5 and Duane L4 nerve roots.  I demonstrated Duane findings on Duane MRI to Duane Reyes.  He notes that he was advised that he may likely require a surgical intervention at some point.    Plain radiographs that were also accomplished demonstrate that Duane Reyes has evidence of a degenerative scoliosis, which may have some congenital features as it starts at Duane level of  Duane dysplastic vertebrae.  He has a compensatory curve that occurs up in Duane thoracic spine.  Curve itself in Duane lumbar spine is less than 20 degrees.  REVIEW OF SYSTEMS:                                    Notable for wearing glasses, history of prostate cancer, leg pain on walking, back pain, leg pain, neck pain, and thyroid disease on a 14-point review sheet reviewed in Duane office today.   PAST MEDICAL HISTORY:                                Duane Reyes's general health has been good.  He has some gastrointestinal symptoms consistent with reflux, for which  he takes Nexium.  In addition to that, he has had prostate cancer that was treated with external beam therapy, brachytherapy and hormonal manipulation.  He notes that he completed that therapy in 08/2012.     Prior Operations:   He has had a meniscus repair in Duane past and he has had a cyst removed from his right arm in 2005, in addition to Duane brachytherapy that he had last year.    Medications and Allergies:  Duane Reyes's medications at Duane current time include 81 mg aspirin, Synthroid, amlodipine, Nexium, hydrocodone, Celebrex, fish oil, Co-Q10, and Tylenol.  SOCIAL HISTORY:                                            He does not smoke or use alcohol.  Height and weight have been stable at 183 pounds and 5 feet 7-1/2 inches in height.  PHYSICAL EXAMINATION:                                Duane Reyes stands straight and erect.  He does have an obvious lumbar scoliosis starting at Duane lumbosacral junction.    NEUROLOGIC EXAMINATION:    Motor Examination - His motor function appears good in Duane iliopsoas, quadriceps, tibialis anterior, but his gastrocs strength seems a bit weak on Duane right compared to Duane left.  No gross atrophy is noted though.      Sensory Examination - Sensation has some decreased findings to vibratory sensation in Duane right L5 distribution.    Deep Tendon Reflexes - Symmetrically depressed in Duane patellae and Achilles  bilaterally.    IMPRESSION/PLAN:                                         Duane Reyes has evidence of spondylolisthesis at L4-L5 with a dysplastic L5 vertebra.  He has stenosis at L5-S1, which is quite substantial centrally and most of his symptoms, however, are of a radicular nature on that right side involving Duane L4 and L5 nerve roots.  I demonstrated Duane findings on Duane MRI to Duane Reyes.  I believe that ultimately he will have to undergo a decompression of L4-L5 and L5-S1 and posterior lumbar interbody fusions will likely be required at each of these levels with pedicle screw fixation performed segmentally from L4 to Duane sacrum.  At this point, I do not see Duane role of further conservative management, particularly as he is starting to develop some gastrocnemius weakness on Duane right side.  I noted to Duane Reyes that Duane course of Duane surgery may take five hours in Duane operating room.  Thereafter, he would require an external corset that he would use for Duane first six to eight weeks after surgery and gradually weaning him out of this.  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 This is indeed a substantial operation, but I do not believe that any lesser surgery is likely to give him significant relief.  I noted that Duane most important process is that he heal from Duane surgery and Duane healing requires they grow one bone to Duane next to  Duane next.  This is a substantial undertaking in that regard and we need to proceed cautiously, but I believe that given Duane amount of conservative management and Duane symptom complex that he has evolved, surgery is an appropriate consideration for his case. He is admitted for surgery.

## 2013-03-12 NOTE — Addendum Note (Signed)
Addendum created 03/12/13 1347 by Lovie Chol, CRNA   Modules edited: Anesthesia Flowsheet

## 2013-03-12 NOTE — Op Note (Signed)
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 Posterior lumbar interbody arthrodesis L4-5 L5-S1 using peek spacers local autograft and allograft. Segmental fixation L4-L5 and the sacrum with pedicle screws, posterior lateral arthrodesis L4-5 L5-S1 with autograft and allograft. Surgeon: Barnett Abu Assistant: Sharyon Cable Anesthesia: Gen. endotracheal Indications: The patient is a 62 year old individual is had significant back and leg pain with progressive weakness including a foot drop is found to have severe spondylitic stenosis secondary to degenerative spondylolisthesis with an isthmic spondylolisthesis at L4 and the pars defects at L5. The patient had developed substantial L5 nerve root weakness secondary to severe stenosis and he is advised regarding the need to have these areas decompressed and stabilized  Procedure: The patient was brought to the operating room supine on a stretcher. After the smooth induction of general endotracheal anesthesia, he was carefully turned prone. The back was prepped with alcohol and DuraPrep. The back was draped sterilely. A midline incision was then created, to expose the spinous processes of L4-L5 and the sacrum. These were identified positively with radiography. Laminotomy was then created at L4-L5 to expose the region of the disc space. This was verified also radiographically. Laminectomy was then completed of L4 and L5. The complete lamina arch out to pars defects of L5 was removed as was laminar arch of L4. There was noted to be severe stenosis of the L4 nerve root superiorly caused by substantial  redundancy of the yellow ligament bony osteophytic overgrowth and grumous material that may have been residual from previous steroid injections. This was all removed in an effort to decompress the L4 nerve roots bilaterally this was done with a 2 and 3 mm Kerrison punch after drilling away substantial amounts of bone in the foramen. The L5 nerve roots were then explored and similar pathology was identified causing severe stenosis of each of the L5 nerve roots. These were decompressed. Similar pathology was found along the S1 nerve roots which were decompressed individually using a similar technique. Once the decompression was completed the discs spaces were explored. Complete discectomies were performed on the left than on the right at L4-5 and L5-S1. Interbody space was explored by distraction first using a disc space spreader and then placing a trial size spacer into the interspace on the contralateral side. This facilitated a good decompression of the disc space and distraction of the vertebrae to restore more normal lordosis and provide ample foraminal opening for the L4 and L5 nerve roots particularly.  Once the interspaces were prepared by decorticating all the cartilaginous endplate material 12 mm peek spacers were prepared and packed with the cost bone sponge. Interspace was packed with autologous autograft mixed with the cost bone sponge the autograft was harvested from the laminar that were resected. The to 12 mm spacers were placed in L4-L5. Attention was then turned to L5-S1 were 210 mm spacers could be placed here along with autologous bone graft and some the cost bone sponge. Once the interbody grafting and arthrodesis procedure was completed pedicle entry sites were chosen.  The pedicles were individually sounded starting at the sacrum were 6.5 x 50 mm pedicle screws were placed under fluoroscopic visualization. Then at L5 explain 5 x 55 mm screws were placed. This was because the L5 vertebrae  was  drawn and ventrally and would require a longer screw to maintain a stable neutral construct. At L4 6.5 x 45 mm screws were placed first by sequentially tapping through the pedicle tapping and threading the pedicle and sounding the pedicle and finally placing the screw. 60 mm precontoured rods were then placed between the screw heads and torqued in neutral fashion. A transverse connector was applied. Prior to placing the screws lateral gutters which had been decorticated amply were packed with autologous bone graft mixed with the cost bone sponge. This intertransverse fusion was performed prior to placement of the hardware.  Final radiographs were then obtained in the operating room the AP and lateral projections identifying good position of the construct. The nerve roots region again individually sounded and found to be free and clear. With this the lumbar dorsal fascia was closed with #1 Vicryl in interrupted fashion 2-0 Vicryl was used in the subcutaneous tissues 3-0 Vicryl was used to close subcuticular skin. 20 cc of half percent Marcaine was injected into the paraspinous fascia at the time of closure to provide some analgesia. Blood loss for the procedure was estimated at 800 cc, approximately 200 cc of Cell Saver blood was returned to the patient.

## 2013-03-12 NOTE — Preoperative (Signed)
Beta Blockers   Reason not to administer Beta Blockers:Not Applicable 

## 2013-03-12 NOTE — Anesthesia Procedure Notes (Signed)
Procedure Name: Intubation Date/Time: 03/12/2013 7:44 AM Performed by: Lovie Chol Pre-anesthesia Checklist: Patient identified, Emergency Drugs available, Suction available, Patient being monitored and Timeout performed Patient Re-evaluated:Patient Re-evaluated prior to inductionOxygen Delivery Method: Circle system utilized Preoxygenation: Pre-oxygenation with 100% oxygen Intubation Type: IV induction Ventilation: Mask ventilation without difficulty and Oral airway inserted - appropriate to patient size Laryngoscope Size: Miller and 2 Grade View: Grade I Tube type: Oral Tube size: 7.5 mm Number of attempts: 1 Airway Equipment and Method: Stylet Placement Confirmation: positive ETCO2,  CO2 detector,  ETT inserted through vocal cords under direct vision and breath sounds checked- equal and bilateral Secured at: 22 cm Tube secured with: Tape Dental Injury: Teeth and Oropharynx as per pre-operative assessment

## 2013-03-12 NOTE — Anesthesia Preprocedure Evaluation (Addendum)
Anesthesia Evaluation  Patient identified by MRN, date of birth, ID band Patient awake    Reviewed: Allergy & Precautions, H&P , NPO status , Patient's Chart, lab work & pertinent test results  History of Anesthesia Complications Negative for: history of anesthetic complications  Airway Mallampati: II TM Distance: >3 FB Neck ROM: Full    Dental  (+) Teeth Intact, Dental Advisory Given and Caps   Pulmonary          Cardiovascular hypertension, Pt. on medications     Neuro/Psych  Headaches,    GI/Hepatic Neg liver ROS, GERD-  Medicated and Controlled,  Endo/Other  Hypothyroidism   Renal/GU negative Renal ROS     Musculoskeletal   Abdominal   Peds  Hematology negative hematology ROS (+)   Anesthesia Other Findings   Reproductive/Obstetrics negative OB ROS                          Anesthesia Physical Anesthesia Plan  ASA: I  Anesthesia Plan: General   Post-op Pain Management:    Induction: Intravenous  Airway Management Planned: Oral ETT  Additional Equipment:   Intra-op Plan:   Post-operative Plan: Extubation in OR  Informed Consent: I have reviewed the patients History and Physical, chart, labs and discussed the procedure including the risks, benefits and alternatives for the proposed anesthesia with the patient or authorized representative who has indicated his/her understanding and acceptance.     Plan Discussed with: CRNA, Anesthesiologist and Surgeon  Anesthesia Plan Comments:         Anesthesia Quick Evaluation

## 2013-03-13 LAB — CBC
MCH: 30.5 pg (ref 26.0–34.0)
MCHC: 33.9 g/dL (ref 30.0–36.0)
Platelets: 127 10*3/uL — ABNORMAL LOW (ref 150–400)
RBC: 3.48 MIL/uL — ABNORMAL LOW (ref 4.22–5.81)
RDW: 13.5 % (ref 11.5–15.5)

## 2013-03-13 LAB — BASIC METABOLIC PANEL
BUN: 17 mg/dL (ref 6–23)
Creatinine, Ser: 1 mg/dL (ref 0.50–1.35)
GFR calc Af Amer: >90 mL/min (ref >90)
Glucose, Bld: 100 mg/dL — ABNORMAL HIGH (ref 70–99)
Sodium: 138 mEq/L (ref 135–145)

## 2013-03-13 MED ORDER — METHOCARBAMOL 500 MG PO TABS
500.0000 mg | ORAL_TABLET | Freq: Four times a day (QID) | ORAL | Status: DC | PRN
  Administered 2013-03-13 (×2): 500 mg via ORAL
  Filled 2013-03-13 (×2): qty 1

## 2013-03-13 MED ORDER — SILVER SULFADIAZINE 1 % EX CREA
TOPICAL_CREAM | Freq: Two times a day (BID) | CUTANEOUS | Status: DC
  Administered 2013-03-13 – 2013-03-15 (×6): via TOPICAL
  Filled 2013-03-13: qty 85

## 2013-03-13 MED FILL — Heparin Sodium (Porcine) Inj 1000 Unit/ML: INTRAMUSCULAR | Qty: 30 | Status: AC

## 2013-03-13 MED FILL — Sodium Chloride IV Soln 0.9%: INTRAVENOUS | Qty: 1000 | Status: AC

## 2013-03-13 MED FILL — Sodium Chloride Irrigation Soln 0.9%: Qty: 3000 | Status: AC

## 2013-03-13 NOTE — Evaluation (Signed)
I agree with the following treatment note after reviewing documentation.   Johnston, Seymour Pavlak Brynn   OTR/L Pager: [phone] Office: [phone] .   

## 2013-03-13 NOTE — Evaluation (Signed)
Occupational Therapy Evaluation Patient Details Name: Duane Reyes MRN: [id number] DOB: [birth year] Today's Date: 03/13/2013 Time: 6962-9528 OT Time Calculation (min): 40 min  OT Assessment / Plan / Recommendation History of present illness 62 y/o male s/p PLIF L4-5, L5-S1.    Clinical Impression   PTA Pt independent in ADL and mobility. Pt educated on precautions and ways to maintain them. Pt educated on AE (reacher/grabber) Pt will benefit from continued skilled OT in the acute setting to maximize independence in ADL and maintaining precautions prior to d/c home.    OT Assessment  Patient needs continued OT Services    Follow Up Recommendations  No OT follow up       Equipment Recommendations  None recommended by OT       Frequency  Min 2X/week    Precautions / Restrictions Precautions Precautions: Back Precaution Booklet Issued: Yes (comment) Precaution Comments: BAT handout Required Braces or Orthoses: Spinal Brace Spinal Brace: Lumbar corset;Applied in sitting position Restrictions Weight Bearing Restrictions: No   Pertinent Vitals/Pain Pt reported 6/10 pain. RN administering medications at end of session. Pt repositioned in chair.    ADL  Eating/Feeding: Independent Where Assessed - Eating/Feeding: Chair Grooming: Wash/dry hands;Supervision/safety Where Assessed - Grooming: Supported standing Upper Body Bathing: Supervision/safety Where Assessed - Upper Body Bathing: Unsupported sitting Lower Body Bathing: Supervision/safety Where Assessed - Lower Body Bathing: Supported sit to stand Upper Body Dressing: Supervision/safety Where Assessed - Upper Body Dressing: Unsupported sitting Lower Body Dressing: Moderate assistance Where Assessed - Lower Body Dressing: Unsupported sitting Toilet Transfer: Supervision/safety Toilet Transfer Method: Sit to Barista: Comfort height toilet Toileting - Clothing Manipulation and Hygiene:  Supervision/safety Where Assessed - Engineer, mining and Hygiene: Standing Tub/Shower Transfer: Modified independent Tub/Shower Transfer Method: Science writer: Walk in shower;Shower seat with back Equipment Used: Back brace;Gait belt;Reacher;Rolling walker Transfers/Ambulation Related to ADLs: Pt supervision for safety with transfers and ambulation ADL Comments: Pt edcuated on setting things up on the right, using the cup method for brushing teeth, using the reacher/grabber for LB dressing, using the "head to hip" rule of thumb to maintain precautions. Pt able to perform peri care and sink level ADL while maintaining precautions at supervision level with UE support on sink counter.    OT Diagnosis: Acute pain  OT Problem List: Decreased safety awareness;Decreased knowledge of use of DME or AE;Decreased knowledge of precautions;Pain OT Treatment Interventions: Self-care/ADL training;DME and/or AE instruction;Therapeutic activities;Patient/family education   OT Goals(Current goals can be found in the care plan section) Acute Rehab OT Goals Patient Stated Goal: home OT Goal Formulation: With patient Time For Goal Achievement: 03/27/13 Potential to Achieve Goals: Good ADL Goals Pt Will Perform Grooming: with modified independence;standing Pt Will Perform Lower Body Dressing: with supervision;with adaptive equipment;sit to/from stand Additional ADL Goal #1: Sitting on the EOB, Pt will don brace modified independent level prior to ADL  Visit Information  Last OT Received On: 03/13/13 Assistance Needed: +1 History of Present Illness: 62 y/o male s/p PLIF L4-5, L5-S1.        Prior Functioning     Home Living Family/patient expects to be discharged to:: Private residence Living Arrangements: Spouse/significant other Available Help at Discharge: Family Type of Home: House Home Access: Stairs to enter Secretary/administrator of Steps: 2 Entrance  Stairs-Rails: None Home Layout: One level Home Equipment: Cane - single point;Shower seat - built in Prior Function Level of Independence: Independent with assistive device(s) Comments: walked with [facility] Communication Communication:  No difficulties Dominant Hand: Right         Vision/Perception Vision - History Baseline Vision: Wears glasses all the time Patient Visual Report: No change from baseline   Cognition  Cognition Arousal/Alertness: Awake/alert Behavior During Therapy: WFL for tasks assessed/performed Overall Cognitive Status: Within Functional Limits for tasks assessed    Extremity/Trunk Assessment Upper Extremity Assessment Upper Extremity Assessment: Overall WFL for tasks assessed Lower Extremity Assessment Lower Extremity Assessment: Overall WFL for tasks assessed Cervical / Trunk Assessment Cervical / Trunk Assessment: Normal     Mobility Bed Mobility Bed Mobility: Rolling Right;Right Sidelying to Sit Rolling Right: 4: Min guard Right Sidelying to Sit: 4: Min guard;HOB flat Details for Bed Mobility Assistance: cues for sequencing and back precautions Transfers Transfers: Sit to Stand;Stand to Sit Sit to Stand: 4: Min guard;With upper extremity assist;From bed Stand to Sit: 5: Supervision;With upper extremity assist;To chair/3-in-1 Details for Transfer Assistance: cues for sequencing and hand placement, slow to rise due to pain        Balance Balance Balance Assessed: Yes Static Standing Balance Static Standing - Balance Support: No upper extremity supported Static Standing - Level of Assistance: 5: Stand by assistance   End of Session OT - End of Session Equipment Utilized During Treatment: Gait belt;Rolling walker;Back brace Activity Tolerance: Patient tolerated treatment well Patient left: in chair;with call bell/phone within reach;with nursing/sitter in room;with family/visitor present Nurse Communication: Other (comment) (first urination since  removal of catheter)  GO     Sherryl Manges 03/13/2013, 11:20 AM

## 2013-03-13 NOTE — Progress Notes (Signed)
Subjective: Patient reports Typical amount of back pain. Motor function and sensation is intact.  Objective: Vital signs in last 24 hours: Temp:  [97 F (36.1 C)-98.9 F (37.2 C)] 98.4 F (36.9 C) (07/16 0554) Pulse Rate:  [63-101] 83 (07/16 0554) Resp:  [10-19] 18 (07/16 0554) BP: (98-163)/(53-135) 102/55 mmHg (07/16 0554) SpO2:  [96 %-100 %] 97 % (07/16 0554) Weight:  [83.462 kg (184 lb)] 83.462 kg (184 lb) (07/15 2300)  Intake/Output from previous day: 07/15 0701 - 07/16 0700 In: 3718 [P.O.:240; I.V.:3003; Blood:225; IV Piggyback:250] Out: 1805 [Urine:1255; Blood:550] Intake/Output this shift:    dressing dry , Leg stregnth and ambulation intact.  Lab Results:  Recent Labs  03/13/13 0445  WBC 8.3  HGB 10.6*  HCT 31.3*  PLT 127*   BMET  Recent Labs  03/13/13 0445  NA 138  K 3.8  CL 106  CO2 27  GLUCOSE 100*  BUN 17  CREATININE 1.00  CALCIUM 7.8*    Studies/Results: Dg Lumbar Spine 2-3 Views  03/12/2013   *RADIOLOGY REPORT*  Clinical Data: Back pain.  DG C-ARM 1-60 MIN,LUMBAR SPINE - 2-3 VIEW  Technique:  C-arm fluoroscopic images were obtained intraoperatively and submitted for postoperative interpretation. Please see the performing provider's procedural report for the fluoroscopy time utilized.  Comparison: Multiple priors.  Findings: C-arm films document L4-S1 posterolateral and interbody fusion using pedicle screw and rod construct with interbody cages. Improved position and alignment.  IMPRESSION: As above.   Original Report Authenticated By: Davonna Belling, M.D.   Dg Lumbar Spine 2-3 Views  03/12/2013   *RADIOLOGY REPORT*  Clinical Data: Back pain.  LUMBAR SPINE - 2-3 VIEW  Comparison: MRI 12/07/2012.  Findings: Film #1 demonstrates towel clamps colon toward year the L3 and L5 spinous processes.  Film #2 demonstrates a blunt probe directed most closely toward the L4-5 interspace.  IMPRESSION: L4-5 marked.   Original Report Authenticated By: Davonna Belling, M.D.    Dg C-arm 1-60 Min  03/12/2013   *RADIOLOGY REPORT*  Clinical Data: Back pain.  DG C-ARM 1-60 MIN,LUMBAR SPINE - 2-3 VIEW  Technique:  C-arm fluoroscopic images were obtained intraoperatively and submitted for postoperative interpretation. Please see the performing provider's procedural report for the fluoroscopy time utilized.  Comparison: Multiple priors.  Findings: C-arm films document L4-S1 posterolateral and interbody fusion using pedicle screw and rod construct with interbody cages. Improved position and alignment.  IMPRESSION: As above.   Original Report Authenticated By: Davonna Belling, M.D.    Assessment/Plan: Stable post op.   LOS: 1 day  Mobilize as tolerated.   Maral Lampe J 03/13/2013, 9:01 AM

## 2013-03-13 NOTE — Evaluation (Signed)
Physical Therapy Evaluation Patient Details Name: Duane Reyes MRN: [id number] DOB: July 29, 1951 Today's Date: 03/13/2013 Time: 4098-1191 PT Time Calculation (min): 21 min  PT Assessment / Plan / Recommendation History of Present Illness  62 y/o male s/p PLIF L4-5, L5-S1.   Clinical Impression  Patient is s/p PLIF surgery resulting in the deficits listed below (see PT Problem List).  Patient will benefit from skilled PT to increase their independence and safety with mobility (while adhering to their precautions) to allow discharge home with support from wife.       PT Assessment  Patient needs continued PT services    Follow Up Recommendations  Supervision for mobility/OOB;Home health PT (vs none pending progress)    Does the patient have the potential to tolerate intense rehabilitation      Barriers to Discharge        Equipment Recommendations  Rolling walker with 5" wheels    Recommendations for Other Services     Frequency Min 5X/week    Precautions / Restrictions Precautions Precautions: Back Required Braces or Orthoses: Spinal Brace Spinal Brace: Lumbar corset;Applied in sitting position Restrictions Weight Bearing Restrictions: No   Pertinent Vitals/Pain C/o 5/10 back pain, assisted to reposition and ambulate for comfort     Mobility  Bed Mobility Bed Mobility: Rolling Right;Right Sidelying to Sit Rolling Right: 4: Min guard Right Sidelying to Sit: 4: Min guard;HOB flat Details for Bed Mobility Assistance: cues for sequencing and back precautions Transfers Transfers: Sit to Stand;Stand to Sit Sit to Stand: 4: Min guard;With upper extremity assist;From bed Details for Transfer Assistance: cues for sequencing and hand placement, slow to rise due to pain Ambulation/Gait Ambulation/Gait Assistance: 4: Min guard Ambulation Distance (Feet): 250 Feet Assistive device: Rolling walker Ambulation/Gait Assistance Details: cues for tall posture and safe positioning  with RW Gait Pattern: Step-through pattern;Trunk flexed General Gait Details: shoulders shifted to the left Stairs: No    Exercises     PT Diagnosis: Difficulty walking;Generalized weakness;Acute pain  PT Problem List: Decreased strength;Decreased activity tolerance;Decreased mobility;Decreased knowledge of precautions;Decreased knowledge of use of DME;Pain PT Treatment Interventions: DME instruction;Gait training;Stair training;Functional mobility training;Therapeutic activities;Therapeutic exercise;Patient/family education     PT Goals(Current goals can be found in the care plan section) Acute Rehab PT Goals Patient Stated Goal: home PT Goal Formulation: With patient Time For Goal Achievement: 03/20/13 Potential to Achieve Goals: Good  Visit Information  Last PT Received On: 03/13/13 Assistance Needed: +1 PT/OT Co-Evaluation/Treatment: Yes History of Present Illness: 62 y/o male s/p PLIF L4-5, L5-S1.        Prior Functioning  Home Living Family/patient expects to be discharged to:: Private residence Living Arrangements: Spouse/significant other Available Help at Discharge: Family Type of Home: House Home Access: Stairs to enter Secretary/administrator of Steps: 2 Entrance Stairs-Rails: None Home Layout: One level Home Equipment: Cane - single point;Shower seat Prior Function Level of Independence: Independent with assistive device(s) Comments: walked with [facility] Communication Communication: No difficulties    Cognition  Cognition Arousal/Alertness: Awake/alert Behavior During Therapy: WFL for tasks assessed/performed Overall Cognitive Status: Within Functional Limits for tasks assessed    Extremity/Trunk Assessment Upper Extremity Assessment Upper Extremity Assessment: Defer to OT evaluation Lower Extremity Assessment Lower Extremity Assessment: Generalized weakness   Balance Balance Balance Assessed: Yes Static Standing Balance Static Standing - Balance  Support: No upper extremity supported Static Standing - Level of Assistance: 5: Stand by assistance  End of Session PT - End of Session Equipment Utilized During Treatment: Gait  belt Activity Tolerance: Patient tolerated treatment well Patient left:  (up with OT in the bathroom) Nurse Communication: Mobility status  GP     [facility] HELEN 03/13/2013, 10:48 AM

## 2013-03-14 NOTE — Progress Notes (Signed)
Subjective: Patient reports offers no complaints. Typical back soreness. No BM but passing gas.  Objective: Vital signs in last 24 hours: Temp:  [98.4 F (36.9 C)-99.2 F (37.3 C)] 99.2 F (37.3 C) (07/17 0516) Pulse Rate:  [86-102] 86 (07/17 0516) Resp:  [18-20] 20 (07/17 0516) BP: (99-114)/(50-67) 114/64 mmHg (07/17 0516) SpO2:  [96 %-98 %] 96 % (07/17 0516)  Intake/Output from previous day: 07/16 0701 - 07/17 0700 In: 120 [P.O.:120] Out: -  Intake/Output this shift:    incision is clean and dry. Motor function is intact.  Lab Results:  Recent Labs  03/13/13 0445  WBC 8.3  HGB 10.6*  HCT 31.3*  PLT 127*   BMET  Recent Labs  03/13/13 0445  NA 138  K 3.8  CL 106  CO2 27  GLUCOSE 100*  BUN 17  CREATININE 1.00  CALCIUM 7.8*    Studies/Results: Dg Lumbar Spine 2-3 Views  03/12/2013   *RADIOLOGY REPORT*  Clinical Data: Back pain.  DG C-ARM 1-60 MIN,LUMBAR SPINE - 2-3 VIEW  Technique:  C-arm fluoroscopic images were obtained intraoperatively and submitted for postoperative interpretation. Please see the performing provider's procedural report for the fluoroscopy time utilized.  Comparison: Multiple priors.  Findings: C-arm films document L4-S1 posterolateral and interbody fusion using pedicle screw and rod construct with interbody cages. Improved position and alignment.  IMPRESSION: As above.   Original Report Authenticated By: Davonna Belling, M.D.   Dg Lumbar Spine 2-3 Views  03/12/2013   *RADIOLOGY REPORT*  Clinical Data: Back pain.  LUMBAR SPINE - 2-3 VIEW  Comparison: MRI 12/07/2012.  Findings: Film #1 demonstrates towel clamps colon toward year the L3 and L5 spinous processes.  Film #2 demonstrates a blunt probe directed most closely toward the L4-5 interspace.  IMPRESSION: L4-5 marked.   Original Report Authenticated By: Davonna Belling, M.D.   Dg C-arm 1-60 Min  03/12/2013   *RADIOLOGY REPORT*  Clinical Data: Back pain.  DG C-ARM 1-60 MIN,LUMBAR SPINE - 2-3 VIEW   Technique:  C-arm fluoroscopic images were obtained intraoperatively and submitted for postoperative interpretation. Please see the performing provider's procedural report for the fluoroscopy time utilized.  Comparison: Multiple priors.  Findings: C-arm films document L4-S1 posterolateral and interbody fusion using pedicle screw and rod construct with interbody cages. Improved position and alignment.  IMPRESSION: As above.   Original Report Authenticated By: Davonna Belling, M.D.    Assessment/Plan: Stable post op.    LOS: 2 days  Ok to shower.   Rajanee Schuelke J 03/14/2013, 8:54 AM

## 2013-03-14 NOTE — Progress Notes (Signed)
Occupational Therapy Treatment Patient Details Name: Duane Reyes MRN: [id number] DOB: [birth year] Today's Date: 03/14/2013 Time: 4098-1191 OT Time Calculation (min): 17 min  OT Assessment / Plan / Recommendation  OT comments  Pt progressing towards goals. Pt really seemed to use the education he received yesterday about maintaining precautions and techniques to help prevent them, especially during sink level ADL and LB dressing. Pt still with min vc's to prevent twisting. Pt still benefiting from skilled OT in acute setting before d/c home with supportive wife.   Follow Up Recommendations  No OT follow up       Equipment Recommendations  None recommended by OT       Frequency Min 2X/week   Progress towards OT Goals Progress towards OT goals: Progressing toward goals  Plan Discharge plan remains appropriate    Precautions / Restrictions Precautions Precautions: Back Precaution Booklet Issued: Yes (comment) Precaution Comments: BAT handout Required Braces or Orthoses: Spinal Brace Spinal Brace: Lumbar corset;Applied in sitting position Restrictions Weight Bearing Restrictions: No   Pertinent Vitals/Pain Pt reported pain as 7/10. Pt reports he had pain medications 1.5 hours ago. Pt repositioned in chair.    ADL  Eating/Feeding: Independent Where Assessed - Eating/Feeding: Chair Grooming: Teeth care;Wash/dry hands;Supervision/safety Where Assessed - Grooming: Supported standing Lower Body Dressing: Supervision/safety (with AE Herbalist)) Where Assessed - Lower Body Dressing: Unsupported sitting Transfers/Ambulation Related to ADLs: Pt supervision for safety with transfers and ambulation ADL Comments: Pt with min vc's during sink level ADL to prevent twisting. Pt practiced cup method for oral care and had things set up on the right in the bathroom. Pt practiced LB dressing with grabber/reacher that he'd already purchased with AE he was mod independent     OT  Goals(current goals can now be found in the care plan section) Acute Rehab OT Goals Patient Stated Goal: home OT Goal Formulation: With patient Time For Goal Achievement: 03/27/13 Potential to Achieve Goals: Good ADL Goals Pt Will Perform Grooming: with modified independence;standing Pt Will Perform Lower Body Dressing: with supervision;with adaptive equipment;sit to/from stand Additional ADL Goal #1: Sitting on the EOB, Pt will don brace modified independent level prior to ADL  Visit Information  Last OT Received On: 03/14/13 Assistance Needed: +1 History of Present Illness: 62 y/o male s/p PLIF L4-5, L5-S1.           Cognition  Cognition Arousal/Alertness: Awake/alert Behavior During Therapy: WFL for tasks assessed/performed Overall Cognitive Status: Within Functional Limits for tasks assessed    Mobility  Bed Mobility Bed Mobility: Not assessed Transfers Transfers: Sit to Stand;Stand to Sit Sit to Stand: 5: Supervision;With upper extremity assist;From chair/3-in-1 Stand to Sit: 6: Modified independent (Device/Increase time);With upper extremity assist;To chair/3-in-1 Details for Transfer Assistance: cues for sequencing and hand placement, slow to rise due to pain       Balance Static Standing Balance Static Standing - Balance Support: Bilateral upper extremity supported Static Standing - Level of Assistance: 5: Stand by assistance   End of Session OT - End of Session Equipment Utilized During Treatment: Gait belt;Rolling walker;Back brace Activity Tolerance: Patient tolerated treatment well Patient left: in chair;with call bell/phone within reach Nurse Communication:  (wants to take a shower)  GO     Sherryl Manges 03/14/2013, 9:15 AM

## 2013-03-14 NOTE — Progress Notes (Signed)
I agree with the following treatment note after reviewing documentation.   Johnston, Abbey Veith Brynn   OTR/L Pager: [phone] Office: [phone] .   

## 2013-03-14 NOTE — Progress Notes (Addendum)
Physical Therapy Treatment Patient Details Name: Duane Reyes MRN: [id number] DOB: Jan 21, 1951 Today's Date: 03/14/2013 Time: 9147-8295 PT Time Calculation (min): 23 min  PT Assessment / Plan / Recommendation  PT Comments   Progressing nicely today. Very motivated. Encouraged rest as well as ambulation and position changes to allow for healing. Reviewed back precautions, pt needing 1 cue for arching.   Follow Up Recommendations  Supervision for mobility/OOB;No PT follow up     Does the patient have the potential to tolerate intense rehabilitation     Barriers to Discharge        Equipment Recommendations  Rolling walker with 5" wheels    Recommendations for Other Services    Frequency Min 5X/week   Progress towards PT Goals Progress towards PT goals: Progressing toward goals  Plan Current plan remains appropriate    Precautions / Restrictions Precautions Precautions: Back Precaution Comments: BAT handout Required Braces or Orthoses: Spinal Brace Spinal Brace: Lumbar corset;Applied in sitting position   Pertinent Vitals/Pain Reports moderate back pain especially with transitional movements, also has minimal left knee pain from old meniscal tear and menisectomy    Mobility  Bed Mobility Bed Mobility: Not assessed Transfers Transfers: Sit to Stand;Stand to Sit Sit to Stand: 5: Supervision;With upper extremity assist;From chair/3-in-1 Stand to Sit: 6: Modified independent (Device/Increase time);With upper extremity assist;To chair/3-in-1 Details for Transfer Assistance: cues for sequencing and hand placement, slow to rise due to pain Ambulation/Gait Ambulation/Gait Assistance: 5: Supervision Ambulation Distance (Feet): 250 Feet Assistive device: Rolling walker Ambulation/Gait Assistance Details: cues for tall posture and safe positioning within RW Gait Pattern: Step-through pattern;Decreased stride length;Antalgic Stairs: Yes Stairs Assistance: 5: Supervision;4: Min  assist Stairs Assistance Details (indicate cue type and reason): supervision to ascend steps forward using 2 rails; instructed on sequencing stairs with RW and pt able to perform with minA to steady RW and technique (need to bring handout) Stair Management Technique: Forwards;Backwards;With walker;Two rails;Step to pattern Number of Stairs: 4 (left knee pain)     PT Goals (current goals can now be found in the care plan section)    Visit Information  Last PT Received On: 03/14/13 Assistance Needed: +1 History of Present Illness: 62 y/o male s/p PLIF L4-5, L5-S1.     Subjective Data   I am inclined to walk.   Cognition  Cognition Arousal/Alertness: Awake/alert Behavior During Therapy: WFL for tasks assessed/performed Overall Cognitive Status: Within Functional Limits for tasks assessed    Balance  Static Standing Balance Static Standing - Balance Support: Bilateral upper extremity supported Static Standing - Level of Assistance: 5: Stand by assistance  End of Session PT - End of Session Equipment Utilized During Treatment: Gait belt Activity Tolerance: Patient tolerated treatment well Nurse Communication: Mobility status   GP     [facility] HELEN 03/14/2013, 8:57 AM

## 2013-03-15 MED ORDER — METHOCARBAMOL 500 MG PO TABS: 500.0000 mg | ORAL_TABLET | Freq: Four times a day (QID) | ORAL | Status: DC | PRN

## 2013-03-15 MED ORDER — OXYCODONE-ACETAMINOPHEN 5-325 MG PO TABS: 1.0000 | ORAL_TABLET | ORAL | Status: DC | PRN

## 2013-03-15 NOTE — Progress Notes (Signed)
Physical Therapy Treatment Patient Details Name: Duane Reyes MRN: [id number] DOB: 09-Aug-1951 Today's Date: 03/15/2013 Time: 2841-3244 PT Time Calculation (min): 56 min  PT Assessment / Plan / Recommendation  PT Comments   Practiced with bed mobility today. Still limited mostly by pain. C/o lower extremity weakness from earlier left knee injury, planning to see his orthopedist when he gets out of the hospital. Ambulating well without physical assist. Will reinforce bed mobility tomorrow.   Follow Up Recommendations  Supervision for mobility/OOB;No PT follow up     Does the patient have the potential to tolerate intense rehabilitation     Barriers to Discharge        Equipment Recommendations  Rolling walker with 5" wheels    Recommendations for Other Services    Frequency Min 5X/week   Progress towards PT Goals Progress towards PT goals: Progressing toward goals  Plan Current plan remains appropriate    Precautions / Restrictions Precautions Precautions: Back Precaution Booklet Issued: Yes (comment) Precaution Comments: BAT handout Required Braces or Orthoses: Spinal Brace Spinal Brace: Lumbar corset;Applied in sitting position   Pertinent Vitals/Pain C/o 5/10 low back pain, RN aware    Mobility  Bed Mobility Bed Mobility: Rolling Left;Left Sidelying to Sit;Sit to Sidelying Right;Sit to Sidelying Left;Rolling Right Rolling Right: 5: Supervision Rolling Left: 5: Supervision Left Sidelying to Sit: 5: Supervision;HOB flat Sit to Sidelying Right: 5: Supervision;HOB flat Sit to Sidelying Left: 5: Supervision;HOB flat Details for Bed Mobility Assistance: x 3 for practice with verbal cues for efficiency, sequencing, relaxed posture and breathing techniques to ease the pain Transfers Sit to Stand: 4: Min guard;With upper extremity assist;From bed Stand to Sit: With upper extremity assist;To chair/3-in-1;5: Supervision;To bed Details for Transfer Assistance: cues for  sequencing and hand placement, slow to rise due to pain Ambulation/Gait Ambulation/Gait Assistance: 5: Supervision Ambulation Distance (Feet): 800 Feet Assistive device: Rolling walker Ambulation/Gait Assistance Details: cues for tall posture and safe positioning with RW Gait Pattern: Step-through pattern;Decreased stride length;Antalgic General Gait Details: hips shifted to the right away from painful left knee with elevated shoulder on the left Stairs: No      PT Goals (current goals can now be found in the care plan section)    Visit Information  Last PT Received On: 03/15/13 Assistance Needed: +1 History of Present Illness: 62 y/o male s/p PLIF L4-5, L5-S1.     Subjective Data      Cognition  Cognition Arousal/Alertness: Awake/alert Behavior During Therapy: WFL for tasks assessed/performed Overall Cognitive Status: Within Functional Limits for tasks assessed    Balance     End of Session PT - End of Session Equipment Utilized During Treatment: Gait belt Activity Tolerance: Patient tolerated treatment well Patient left: in bed;with call bell/phone within reach Nurse Communication: Mobility status   GP     [facility] HELEN 03/15/2013, 1:12 PM

## 2013-03-15 NOTE — Discharge Summary (Signed)
Physician Discharge Summary  Patient ID: Duane Reyes MRN: [id number] DOB/AGE: 11/13/1950 62 y.o.  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 Has some postoperative constipation but this appears to be resolving. He is discharged home  Consults: None  Significant Diagnostic Studies: None  Treatments: Posterior decompression L4-L5 and S1 nerve roots posterior interbody arthrodesis L4-5 and L5-S1 segmental fixation L4 to sacrum posterior lateral arthrodesis with local autograft and allograft.  Discharge Exam: Blood pressure 114/62, pulse 87, temperature 98.7 F (37.1 C), temperature source Oral, resp. rate 20, height 5\' 8"  (1.727 m), weight 83.462 kg (184 lb), SpO2 99.00%. Incisions clean and dry motor function is good in iliopsoas quadriceps tibialis anterior and gastrocs  Disposition: 01-Home or Self Care  Discharge Orders   Future Orders Complete By Expires     Call MD for:  redness, tenderness, or signs of infection (pain, swelling, redness, odor or green/yellow discharge around incision site)  As directed     Call MD for:  severe uncontrolled pain  As directed     Call MD for:  temperature >100.4  As directed     Diet - low sodium heart healthy  As directed     Discharge instructions  As directed     Comments:      Okay to shower. Do not apply salves or appointments to incision. No heavy lifting with the upper extremities greater than 15 pounds. May resume driving  when not requiring pain medication and patient feels comfortable with doing so.    Increase activity slowly  As directed         Medication List         amLODipine 5 MG tablet  Commonly known as:  NORVASC  Take 5 mg by mouth daily.     aspirin 81 MG chewable tablet  Chew 81 mg by mouth daily.     co-enzyme Q-10 30 MG capsule  Take 30 mg by mouth 3 (three) times daily.     DUEXIS 800-26.6 MG Tabs  Generic drug:  Ibuprofen-Famotidine  Take 1 tablet by mouth 2 (two) times daily.     esomeprazole 40 MG capsule  Commonly known as:  NEXIUM  Take 40 mg by mouth daily before breakfast.     gabapentin 300 MG capsule  Commonly known as:  NEURONTIN  Take 300-600 mg by mouth 3 (three) times daily. 300 mg twice daily, then 600 mg at bedtime     HYDROcodone-acetaminophen 5-325 MG per tablet  Commonly known as:  NORCO/VICODIN  Take 1 tablet by mouth 2 (two) times daily.     hydroxypropyl methylcellulose 2.5 % ophthalmic solution  Commonly known as:  ISOPTO TEARS  Place 1 drop into both eyes 3 (three) times daily as needed (dry eyes).     levothyroxine 175 MCG tablet  Commonly known as:  SYNTHROID, LEVOTHROID  Take 175 mcg by mouth daily before breakfast.     methocarbamol 500 MG tablet  Commonly known as:  ROBAXIN  Take 1 tablet (500 mg total) by mouth every 6 (six) hours as needed.  omega-3 acid ethyl esters 1 G capsule  Commonly known as:  LOVAZA  Take 3 g by mouth daily.     oxyCODONE-acetaminophen 5-325 MG per tablet  Commonly known as:  PERCOCET/ROXICET  Take 1-2 tablets by mouth every 4 (four) hours as needed for pain.         SignedStefani Dama 03/15/2013, 8:51 AM

## 2013-03-15 NOTE — Clinical Social Work Note (Signed)
Clinical Social Worker received referral for possible ST-SNF placement.  Chart reviewed.  PT/OT recommending no follow up at this time.    CSW signing off - please re consult if social work needs arise.  Jesse Jasreet Dickie, LCSW [phone] 

## 2013-03-15 NOTE — Progress Notes (Signed)
Subjective: Patient reports Patient offers no complaint. Experiencing some difficulty getting out of bed. Physical therapy and occupational therapy is working with him. Has not had a bowel movement yet. Has passed some gas.  Objective: Vital signs in last 24 hours: Temp:  [97.3 F (36.3 C)-99.4 F (37.4 C)] 98.7 F (37.1 C) (07/18 0535) Pulse Rate:  [87-93] 87 (07/18 0535) Resp:  [16-20] 20 (07/18 0535) BP: (112-132)/(62-70) 114/62 mmHg (07/18 0535) SpO2:  [95 %-100 %] 99 % (07/18 0535)  Intake/Output from previous day: 07/17 0701 - 07/18 0700 In: 240 [P.O.:240] Out: -  Intake/Output this shift:    Incision is clean and dry motor function remains intact.  Lab Results:  Recent Labs  03/13/13 0445  WBC 8.3  HGB 10.6*  HCT 31.3*  PLT 127*   BMET  Recent Labs  03/13/13 0445  NA 138  K 3.8  CL 106  CO2 27  GLUCOSE 100*  BUN 17  CREATININE 1.00  CALCIUM 7.8*    Studies/Results: No results found.  Assessment/Plan: Stable postop day 3. Hopeful for discharge soon. Laxatives for bowel movement  LOS: 3 days  Plan discharge for tomorrow.   Dawn Convery J 03/15/2013, 8:44 AM

## 2013-09-09 ENCOUNTER — Encounter (HOSPITAL_BASED_OUTPATIENT_CLINIC_OR_DEPARTMENT_OTHER): Payer: Self-pay | Admitting: Emergency Medicine

## 2013-09-09 ENCOUNTER — Emergency Department (HOSPITAL_BASED_OUTPATIENT_CLINIC_OR_DEPARTMENT_OTHER)
Admission: EM | Admit: 2013-09-09 | Discharge: 2013-09-09 | Disposition: A | Discharge status: Home / Self Care | Payer: BC Managed Care – PPO | Attending: Emergency Medicine | Admitting: Emergency Medicine

## 2013-09-09 DIAGNOSIS — Z8669 Personal history of other diseases of the nervous system and sense organs: Secondary | ICD-10-CM | POA: Insufficient documentation

## 2013-09-09 DIAGNOSIS — Z7982 Long term (current) use of aspirin: Secondary | ICD-10-CM | POA: Insufficient documentation

## 2013-09-09 DIAGNOSIS — Z79899 Other long term (current) drug therapy: Secondary | ICD-10-CM | POA: Insufficient documentation

## 2013-09-09 DIAGNOSIS — Z8701 Personal history of pneumonia (recurrent): Secondary | ICD-10-CM | POA: Insufficient documentation

## 2013-09-09 DIAGNOSIS — K219 Gastro-esophageal reflux disease without esophagitis: Secondary | ICD-10-CM | POA: Insufficient documentation

## 2013-09-09 DIAGNOSIS — IMO0002 Reserved for concepts with insufficient information to code with codable children: Secondary | ICD-10-CM

## 2013-09-09 DIAGNOSIS — I10 Essential (primary) hypertension: Secondary | ICD-10-CM | POA: Insufficient documentation

## 2013-09-09 DIAGNOSIS — Z8619 Personal history of other infectious and parasitic diseases: Secondary | ICD-10-CM | POA: Insufficient documentation

## 2013-09-09 DIAGNOSIS — Z8546 Personal history of malignant neoplasm of prostate: Secondary | ICD-10-CM | POA: Insufficient documentation

## 2013-09-09 DIAGNOSIS — M1710 Unilateral primary osteoarthritis, unspecified knee: Secondary | ICD-10-CM | POA: Insufficient documentation

## 2013-09-09 DIAGNOSIS — E039 Hypothyroidism, unspecified: Secondary | ICD-10-CM | POA: Insufficient documentation

## 2013-09-09 DIAGNOSIS — J111 Influenza due to unidentified influenza virus with other respiratory manifestations: Secondary | ICD-10-CM | POA: Insufficient documentation

## 2013-09-09 MED ORDER — OSELTAMIVIR PHOSPHATE 75 MG PO CAPS: 75.0000 mg | ORAL_CAPSULE | Freq: Two times a day (BID) | ORAL | Status: DC

## 2013-09-09 NOTE — ED Provider Notes (Signed)
History/physical exam/procedure(s) were performed by non-physician practitioner and as supervising physician I was immediately available for consultation/collaboration. I have reviewed all notes and am in agreement with care and plan.   Shaune Pollack, MD 09/09/13 1536

## 2013-09-09 NOTE — ED Notes (Signed)
Pt c/o cough, fever, nasal congestion and sinus pressure x 3 days. Denies n/v/d.

## 2013-09-09 NOTE — Discharge Instructions (Signed)

## 2013-09-09 NOTE — ED Provider Notes (Signed)
CSN: [id number]     Arrival date & time 09/09/13  0919 History   First MD Initiated Contact with Patient 09/09/13 [phone]     Chief Complaint  Patient presents with  . Fever  . Cough   (Consider location/radiation/quality/duration/timing/severity/associated sxs/prior Treatment) Patient is a 63 y.o. male presenting with fever and cough. The history is provided by the patient. No language interpreter was used.  Fever Max temp prior to arrival:  101 Temp source:  Oral Severity:  Moderate Onset quality:  Gradual Timing:  Constant Progression:  Worsening Chronicity:  New Relieved by:  None tried Worsened by:  Nothing tried Ineffective treatments:  None tried Associated symptoms: congestion and cough   Cough Associated symptoms: fever   Associated symptoms: no shortness of breath and no wheezing     Past Medical History  Diagnosis Date  . Prostate cancer     Prostate  . Hypertension     takes Amlodipine daily  . Hyperlipidemia     was on meds but hasn't been on in 74months;Dr.Nyland took him off  . Pneumonia     hx of;last time about 37yrs ago  . Headache(784.0)   . Weakness     right hand foot and leg  . Arthritis     left knee  . GERD (gastroesophageal reflux disease)     takes Nexium daily  . Hypothyroidism     takes Synthroid daily  . Urinary frequency   . Nocturia   . Dry eyes     uses eye drops tid  . Insomnia     occasionally will take Gabapentin   . History of shingles    Past Surgical History  Procedure Laterality Date  . Knee surgery Left 2012  . Tonsillectomy      as a child  . Cyst removed from right arm   2005  . [place] therapy  2013  . Colonoscopy    . Esophagogastroduodenoscopy    . Back surgery     No family history on file. History  Substance Use Topics  . Smoking status: Never Smoker   . Smokeless tobacco: Not on file  . Alcohol Use: No    Review of Systems  Constitutional: Positive for fever.  HENT: Positive for congestion.    Respiratory: Positive for cough. Negative for shortness of breath and wheezing.   All other systems reviewed and are negative.    Allergies  Celebrex and Vimovo  Home Medications   Current Outpatient Rx  Name  Route  Sig  Dispense  Refill  . amLODipine (NORVASC) 5 MG tablet   Oral   Take 5 mg by mouth daily.         [name] aspirin 81 MG chewable tablet   Oral   Chew 81 mg by mouth daily.         [name] co-enzyme Q-10 30 MG capsule   Oral   Take 30 mg by mouth 3 (three) times daily.         [name] esomeprazole (NEXIUM) 40 MG capsule   Oral   Take 40 mg by mouth daily before breakfast.         . gabapentin (NEURONTIN) 300 MG capsule   Oral   Take 300-600 mg by mouth 3 (three) times daily. 300 mg twice daily, then 600 mg at bedtime         . HYDROcodone-acetaminophen (NORCO/VICODIN) 5-325 MG per tablet   Oral   Take 1 tablet by mouth 2 (two) times  daily.         . hydroxypropyl methylcellulose (ISOPTO TEARS) 2.5 % ophthalmic solution   Both Eyes   Place 1 drop into both eyes 3 (three) times daily as needed (dry eyes).          . Ibuprofen-Famotidine (DUEXIS) 800-26.6 MG TABS   Oral   Take 1 tablet by mouth 2 (two) times daily.         [name] levothyroxine (SYNTHROID, LEVOTHROID) 175 MCG tablet   Oral   Take 175 mcg by mouth daily before breakfast.         . methocarbamol (ROBAXIN) 500 MG tablet   Oral   Take 1 tablet (500 mg total) by mouth every 6 (six) hours as needed.   60 tablet   3   . omega-3 acid ethyl esters (LOVAZA) 1 G capsule   Oral   Take 3 g by mouth daily.         [name] oseltamivir (TAMIFLU) 75 MG capsule   Oral   Take 1 capsule (75 mg total) by mouth every 12 (twelve) hours.   10 capsule   0   . oxyCODONE-acetaminophen (PERCOCET/ROXICET) 5-325 MG per tablet   Oral   Take 1-2 tablets by mouth every 4 (four) hours as needed for pain.   60 tablet   0    BP 146/81  Pulse 109  Temp(Src) 101.7 F (38.7 C) (Oral)  Resp 18  Ht 5' 7.5"  (1.715 m)  Wt 179 lb 9 oz (81.449 kg)  BMI 27.69 kg/m2  SpO2 98% Physical Exam  Nursing note and vitals reviewed. Constitutional: He appears well-developed and well-nourished.  HENT:  Head: Normocephalic.  Right Ear: External ear normal.  Left Ear: External ear normal.  Nose: Nose normal.  Mouth/Throat: Oropharynx is clear and moist.  Eyes: Pupils are equal, round, and reactive to light.  Neck: Normal range of motion. Neck supple.  Cardiovascular: Normal rate.   Pulmonary/Chest: Effort normal and breath sounds normal.  Abdominal: Soft.  Musculoskeletal: Normal range of motion.  Neurological: He is alert.  Skin: Skin is warm.    ED Course  Procedures (including critical care time) Labs Review Labs Reviewed - No data to display Imaging Review No results found.  EKG Interpretation   None       MDM   1. Influenza   tamiflu for 5 days.   See Dr. Edrick Oh for recck in 3-4 days.   Return if symptoms worsen or change.    [place], PA-C 09/09/13 [phone]

## 2014-06-09 ENCOUNTER — Other Ambulatory Visit (HOSPITAL_COMMUNITY): Payer: Self-pay | Admitting: Family Medicine

## 2014-06-09 DIAGNOSIS — R51 Headache: Principal | ICD-10-CM

## 2014-06-09 DIAGNOSIS — R519 Headache, unspecified: Secondary | ICD-10-CM

## 2014-06-12 ENCOUNTER — Ambulatory Visit (HOSPITAL_COMMUNITY)
Admission: RE | Admit: 2014-06-12 | Discharge: 2014-06-12 | Disposition: A | Discharge status: Home / Self Care | Payer: BC Managed Care – PPO | Source: Ambulatory Visit | Attending: Family Medicine | Admitting: Family Medicine

## 2014-06-12 DIAGNOSIS — R51 Headache: Secondary | ICD-10-CM | POA: Insufficient documentation

## 2014-06-12 DIAGNOSIS — R519 Headache, unspecified: Secondary | ICD-10-CM

## 2014-06-12 DIAGNOSIS — M436 Torticollis: Secondary | ICD-10-CM | POA: Diagnosis not present

## 2014-06-12 DIAGNOSIS — Z8673 Personal history of transient ischemic attack (TIA), and cerebral infarction without residual deficits: Secondary | ICD-10-CM | POA: Diagnosis not present

## 2015-04-29 ENCOUNTER — Emergency Department (HOSPITAL_COMMUNITY): Payer: BLUE CROSS/BLUE SHIELD

## 2015-04-29 ENCOUNTER — Encounter (HOSPITAL_COMMUNITY): Payer: Self-pay | Admitting: *Deleted

## 2015-04-29 ENCOUNTER — Observation Stay (HOSPITAL_BASED_OUTPATIENT_CLINIC_OR_DEPARTMENT_OTHER): Payer: BLUE CROSS/BLUE SHIELD

## 2015-04-29 ENCOUNTER — Observation Stay (HOSPITAL_COMMUNITY)
Admission: EM | Admit: 2015-04-29 | Discharge: 2015-04-30 | Disposition: A | Discharge status: Home / Self Care | Payer: BLUE CROSS/BLUE SHIELD | Attending: Cardiology | Admitting: Cardiology

## 2015-04-29 DIAGNOSIS — R079 Chest pain, unspecified: Secondary | ICD-10-CM | POA: Diagnosis not present

## 2015-04-29 DIAGNOSIS — E785 Hyperlipidemia, unspecified: Secondary | ICD-10-CM | POA: Diagnosis not present

## 2015-04-29 DIAGNOSIS — M1990 Unspecified osteoarthritis, unspecified site: Secondary | ICD-10-CM | POA: Diagnosis not present

## 2015-04-29 DIAGNOSIS — Z8701 Personal history of pneumonia (recurrent): Secondary | ICD-10-CM | POA: Diagnosis not present

## 2015-04-29 DIAGNOSIS — Z8546 Personal history of malignant neoplasm of prostate: Secondary | ICD-10-CM | POA: Diagnosis not present

## 2015-04-29 DIAGNOSIS — E039 Hypothyroidism, unspecified: Secondary | ICD-10-CM | POA: Diagnosis not present

## 2015-04-29 DIAGNOSIS — I10 Essential (primary) hypertension: Secondary | ICD-10-CM | POA: Diagnosis not present

## 2015-04-29 DIAGNOSIS — Z79899 Other long term (current) drug therapy: Secondary | ICD-10-CM | POA: Insufficient documentation

## 2015-04-29 DIAGNOSIS — Z8619 Personal history of other infectious and parasitic diseases: Secondary | ICD-10-CM | POA: Insufficient documentation

## 2015-04-29 DIAGNOSIS — K219 Gastro-esophageal reflux disease without esophagitis: Secondary | ICD-10-CM | POA: Diagnosis not present

## 2015-04-29 DIAGNOSIS — Z7982 Long term (current) use of aspirin: Secondary | ICD-10-CM | POA: Diagnosis not present

## 2015-04-29 LAB — PROTIME-INR
INR: 1.16 (ref 0.00–1.49)
PROTHROMBIN TIME: 15 s (ref 11.6–15.2)

## 2015-04-29 LAB — BASIC METABOLIC PANEL
ANION GAP: 7 (ref 5–15)
BUN: 11 mg/dL (ref 6–20)
CALCIUM: 9.4 mg/dL (ref 8.9–10.3)
CO2: 26 mmol/L (ref 22–32)
Chloride: 107 mmol/L (ref 101–111)
Creatinine, Ser: 1.09 mg/dL (ref 0.61–1.24)
GFR calc Af Amer: >60 mL/min (ref >60)
GFR calc non Af Amer: >60 mL/min (ref >60)
GLUCOSE: 97 mg/dL (ref 65–99)
Potassium: 3.8 mmol/L (ref 3.5–5.1)
Sodium: 140 mmol/L (ref 135–145)

## 2015-04-29 LAB — CBC
HEMATOCRIT: 47.2 % (ref 39.0–52.0)
HEMOGLOBIN: 16.1 g/dL (ref 13.0–17.0)
MCH: 30.9 pg (ref 26.0–34.0)
MCHC: 34.1 g/dL (ref 30.0–36.0)
MCV: 90.6 fL (ref 78.0–100.0)
Platelets: 187 10*3/uL (ref 150–400)
RBC: 5.21 MIL/uL (ref 4.22–5.81)
RDW: 13.4 % (ref 11.5–15.5)
WBC: 6.2 10*3/uL (ref 4.0–10.5)

## 2015-04-29 LAB — MAGNESIUM: Magnesium: 2 mg/dL (ref 1.7–2.4)

## 2015-04-29 LAB — BRAIN NATRIURETIC PEPTIDE: B Natriuretic Peptide: 30 pg/mL (ref 0.0–100.0)

## 2015-04-29 LAB — HEPARIN LEVEL (UNFRACTIONATED): HEPARIN UNFRACTIONATED: 0.31 [IU]/mL (ref 0.30–0.70)

## 2015-04-29 LAB — I-STAT TROPONIN, ED: TROPONIN I, POC: 0 ng/mL (ref 0.00–0.08)

## 2015-04-29 LAB — TROPONIN I: Troponin I: <0.03 ng/mL (ref <0.031)

## 2015-04-29 LAB — TSH: TSH: 0.51 u[IU]/mL (ref 0.350–4.500)

## 2015-04-29 MED ORDER — COENZYME Q10 30 MG PO CAPS: 30.0000 mg | ORAL_CAPSULE | Freq: Three times a day (TID) | ORAL | Status: DC

## 2015-04-29 MED ORDER — SODIUM CHLORIDE 0.9 % IV SOLN
250.0000 mL | INTRAVENOUS | Status: DC | PRN
Start: 2015-04-29 — End: 2015-04-30

## 2015-04-29 MED ORDER — ONDANSETRON HCL 4 MG/2ML IJ SOLN: 4.0000 mg | Freq: Four times a day (QID) | INTRAMUSCULAR | Status: DC | PRN

## 2015-04-29 MED ORDER — ASPIRIN 300 MG RE SUPP: 300.0000 mg | RECTAL | Status: DC

## 2015-04-29 MED ORDER — ASPIRIN 81 MG PO CHEW: 324.0000 mg | CHEWABLE_TABLET | ORAL | Status: DC

## 2015-04-29 MED ORDER — SODIUM CHLORIDE 0.9 % IJ SOLN
3.0000 mL | Freq: Two times a day (BID) | INTRAMUSCULAR | Status: DC
Start: 2015-04-29 — End: 2015-04-30
  Administered 2015-04-29: 3 mL via INTRAVENOUS

## 2015-04-29 MED ORDER — ATORVASTATIN CALCIUM 80 MG PO TABS: 80.0000 mg | ORAL_TABLET | Freq: Every day | ORAL | Status: DC

## 2015-04-29 MED ORDER — ASPIRIN 81 MG PO CHEW
324.0000 mg | CHEWABLE_TABLET | Freq: Once | ORAL | Status: DC
  Filled 2015-04-29: qty 4

## 2015-04-29 MED ORDER — HEPARIN BOLUS VIA INFUSION
4000.0000 [IU] | Freq: Once | INTRAVENOUS | Status: AC
  Administered 2015-04-29: 4000 [IU] via INTRAVENOUS
  Filled 2015-04-29: qty 4000

## 2015-04-29 MED ORDER — PRAVASTATIN SODIUM 20 MG PO TABS: 20.0000 mg | ORAL_TABLET | Freq: Every day | ORAL | Status: DC

## 2015-04-29 MED ORDER — NITROGLYCERIN 0.4 MG SL SUBL: 0.4000 mg | SUBLINGUAL_TABLET | SUBLINGUAL | Status: DC | PRN

## 2015-04-29 MED ORDER — ASPIRIN EC 81 MG PO TBEC: 81.0000 mg | DELAYED_RELEASE_TABLET | Freq: Every day | ORAL | Status: DC

## 2015-04-29 MED ORDER — LEVOTHYROXINE SODIUM 25 MCG PO TABS
125.0000 ug | ORAL_TABLET | Freq: Every day | ORAL | Status: DC
  Administered 2015-04-30: 125 ug via ORAL
  Filled 2015-04-29 (×2): qty 1

## 2015-04-29 MED ORDER — ACETAMINOPHEN 325 MG PO TABS
650.0000 mg | ORAL_TABLET | ORAL | Status: DC | PRN
  Administered 2015-04-30 (×2): 650 mg via ORAL
  Filled 2015-04-29 (×2): qty 2

## 2015-04-29 MED ORDER — HEPARIN (PORCINE) IN NACL 100-0.45 UNIT/ML-% IJ SOLN
1200.0000 [IU]/h | INTRAMUSCULAR | Status: DC
  Administered 2015-04-29: 1100 [IU]/h via INTRAVENOUS
  Filled 2015-04-29 (×2): qty 250

## 2015-04-29 MED ORDER — METHOCARBAMOL 500 MG PO TABS: 500.0000 mg | ORAL_TABLET | ORAL | Status: DC | PRN

## 2015-04-29 MED ORDER — SODIUM CHLORIDE 0.9 % IJ SOLN: 3.0000 mL | INTRAMUSCULAR | Status: DC | PRN

## 2015-04-29 MED ORDER — SODIUM CHLORIDE 0.9 % IV SOLN
Freq: Once | INTRAVENOUS | Status: AC
  Administered 2015-04-29: 14:00:00 via INTRAVENOUS

## 2015-04-29 MED ORDER — AMLODIPINE BESYLATE 5 MG PO TABS
5.0000 mg | ORAL_TABLET | Freq: Every day | ORAL | Status: DC
  Administered 2015-04-30: 5 mg via ORAL
  Filled 2015-04-29: qty 1

## 2015-04-29 MED ORDER — NITROGLYCERIN 0.4 MG SL SUBL
0.4000 mg | SUBLINGUAL_TABLET | SUBLINGUAL | Status: DC | PRN
  Filled 2015-04-29: qty 1

## 2015-04-29 MED ORDER — ASPIRIN 81 MG PO CHEW
81.0000 mg | CHEWABLE_TABLET | Freq: Every day | ORAL | Status: DC
  Administered 2015-04-30: 81 mg via ORAL
  Filled 2015-04-29: qty 1

## 2015-04-29 MED ORDER — PANTOPRAZOLE SODIUM 40 MG PO TBEC
40.0000 mg | DELAYED_RELEASE_TABLET | Freq: Every day | ORAL | Status: DC
  Administered 2015-04-30: 40 mg via ORAL
  Filled 2015-04-29: qty 1

## 2015-04-29 MED ORDER — OMEGA-3-ACID ETHYL ESTERS 1 G PO CAPS
3.0000 g | ORAL_CAPSULE | Freq: Every day | ORAL | Status: DC
  Administered 2015-04-30: 3 g via ORAL
  Filled 2015-04-29 (×2): qty 3

## 2015-04-29 NOTE — ED Notes (Signed)
Pt reports chest pressure that started yesterday. Pt states that it is worse when he is walking around pt states that he has occasional hand numbness. Pt reports SOB as well. NAD noted in triage.

## 2015-04-29 NOTE — Progress Notes (Signed)
  Echocardiogram 2D Echocardiogram has been performed.  Duane Reyes 04/29/2015, 5:24 PM

## 2015-04-29 NOTE — ED Provider Notes (Signed)
CSN: [id number]     Arrival date & time 04/29/15  1212 History   First MD Initiated Contact with Patient 04/29/15 1315     Chief Complaint  Patient presents with  . Chest Pain     (Consider location/radiation/quality/duration/timing/severity/associated sxs/prior Treatment) Patient is a 64 y.o. male presenting with chest pain. The history is provided by the patient. No language interpreter was used.  Chest Pain Pain location:  L chest Pain quality: aching   Pain radiates to:  Does not radiate Pain radiates to the back: no   Pain severity:  No pain Onset quality:  Sudden Timing:  Constant Progression:  Worsening Chronicity:  New Context: not breathing and no movement   Relieved by:  Nothing Worsened by:  Exertion Ineffective treatments:  None tried Associated symptoms: no lower extremity edema and no shortness of breath   Risk factors: hypertension   Pt complains of increased fatigue with exertion for the past month.  Pt reports chest pain today.    Past Medical History  Diagnosis Date  . Prostate cancer     Prostate  . Hypertension     takes Amlodipine daily  . Hyperlipidemia     was on meds but hasn't been on in 63months;Dr.Nyland took him off  . Pneumonia     hx of;last time about 29yrs ago  . Headache(784.0)   . Weakness     right hand foot and leg  . Arthritis     left knee  . GERD (gastroesophageal reflux disease)     takes Nexium daily  . Hypothyroidism     takes Synthroid daily  . Urinary frequency   . Nocturia   . Dry eyes     uses eye drops tid  . Insomnia     occasionally will take Gabapentin   . History of shingles    Past Surgical History  Procedure Laterality Date  . Knee surgery Left 2012  . Tonsillectomy      as a child  . Cyst removed from right arm   2005  . [place] therapy  2013  . Colonoscopy    . Esophagogastroduodenoscopy    . Back surgery     No family history on file. Social History  Substance Use Topics  . Smoking status:  Never Smoker   . Smokeless tobacco: None  . Alcohol Use: No    Review of Systems  Respiratory: Negative for shortness of breath.   Cardiovascular: Positive for chest pain.  All other systems reviewed and are negative.     Allergies  Celebrex and Vimovo  Home Medications   Prior to Admission medications   Medication Sig Start Date End Date Taking? Authorizing Provider  amLODipine (NORVASC) 5 MG tablet Take 5 mg by mouth daily.    Historical Provider, MD  aspirin 81 MG chewable tablet Chew 81 mg by mouth daily.    Historical Provider, MD  co-enzyme Q-10 30 MG capsule Take 30 mg by mouth 3 (three) times daily.    Historical Provider, MD  esomeprazole (NEXIUM) 40 MG capsule Take 40 mg by mouth daily before breakfast.    Historical Provider, MD  gabapentin (NEURONTIN) 300 MG capsule Take 300-600 mg by mouth 3 (three) times daily. 300 mg twice daily, then 600 mg at bedtime    Historical Provider, MD  HYDROcodone-acetaminophen (NORCO/VICODIN) 5-325 MG per tablet Take 1 tablet by mouth 2 (two) times daily.    Historical Provider, MD  hydroxypropyl methylcellulose (ISOPTO TEARS) 2.5 % ophthalmic  solution Place 1 drop into both eyes 3 (three) times daily as needed (dry eyes).     Historical Provider, MD  Ibuprofen-Famotidine (DUEXIS) 800-26.6 MG TABS Take 1 tablet by mouth 2 (two) times daily.    Historical Provider, MD  levothyroxine (SYNTHROID, LEVOTHROID) 175 MCG tablet Take 175 mcg by mouth daily before breakfast.    Historical Provider, MD  methocarbamol (ROBAXIN) 500 MG tablet Take 1 tablet (500 mg total) by mouth every 6 (six) hours as needed. 03/15/13   Kristeen Miss, MD  omega-3 acid ethyl esters (LOVAZA) 1 G capsule Take 3 g by mouth daily.    Historical Provider, MD  oseltamivir (TAMIFLU) 75 MG capsule Take 1 capsule (75 mg total) by mouth every 12 (twelve) hours. 09/09/13   Fransico Meadow, PA-C  oxyCODONE-acetaminophen (PERCOCET/ROXICET) 5-325 MG per tablet Take 1-2 tablets by mouth  every 4 (four) hours as needed for pain. 03/15/13   Kristeen Miss, MD   BP 131/74 mmHg  Pulse 90  Temp(Src) 97.5 F (36.4 C) (Oral)  Resp 16  Ht 5\' 7"  (1.702 m)  Wt 187 lb 14.4 oz (85.231 kg)  BMI 29.42 kg/m2  SpO2 96% Physical Exam  Constitutional: He is oriented to person, place, and time. He appears well-developed and well-nourished.  HENT:  Head: Normocephalic.  Right Ear: External ear normal.  Nose: Nose normal.  Mouth/Throat: Oropharynx is clear and moist.  Eyes: Conjunctivae and EOM are normal. Pupils are equal, round, and reactive to light.  Neck: Normal range of motion.  Cardiovascular: Normal rate and normal heart sounds.   Pulmonary/Chest: Effort normal.  Abdominal: Soft. He exhibits no distension.  Musculoskeletal: Normal range of motion.  Neurological: He is alert and oriented to person, place, and time.  Skin: Skin is warm.  Psychiatric: He has a normal mood and affect.  Nursing note and vitals reviewed.   ED Course  Procedures (including critical care time) Labs Review Labs Reviewed  CBC  BASIC METABOLIC PANEL  Randolm Idol, ED    Imaging Review Dg Chest 2 View  04/29/2015   CLINICAL DATA:  64 year old male with shortness of breath chest pain in weakness. Initial encounter.  EXAM: CHEST  2 VIEW  COMPARISON:  03/05/2013.  FINDINGS: Moderate to severe thoracolumbar scoliosis. Stable visualized osseous structures. Stable lung volumes. Stable cardiac size and mediastinal contours. Visualized tracheal air column is within normal limits. No pneumothorax, pulmonary edema, pleural effusion or acute pulmonary opacity.  IMPRESSION: No acute cardiopulmonary abnormality.  Chronic severe scoliosis.   Electronically Signed   By: Genevie Ann M.D.   On: 04/29/2015 13:02   I have personally reviewed and evaluated these images and lab results as part of my medical decision-making.   EKG Interpretation   Date/Time:  Wednesday April 29 2015 12:17:44 EDT Ventricular Rate:   90 PR Interval:  176 QRS Duration: 96 QT Interval:  368 QTC Calculation: 450 R Axis:   -35 Text Interpretation:  Normal sinus rhythm Left axis deviation Incomplete  right bundle branch block Possible Anterior infarct , age undetermined  Abnormal ECG No significant change since last tracing Confirmed by Mingo Amber   MD, [place] [phone]) on 04/29/2015 1:15:41 PM      MDM   Final diagnoses:  Chest pain, unspecified chest pain type    I spoke to cardiology who will see for evaluation.     Hollace Kinnier [place], PA-C 04/29/15 1443  Evelina Bucy, MD 04/29/15 [phone]

## 2015-04-29 NOTE — Progress Notes (Addendum)
ANTICOAGULATION CONSULT NOTE - Initial Consult  Pharmacy Consult for heparin Indication: chest pain/ACS  Allergies  Allergen Reactions  . Celebrex [Celecoxib]     unknown  . Topamax [Topiramate]     blurred vision, tired, ringing in ears, restless, confussion  . Vimovo [Naproxen-Esomeprazole] Itching and Rash    Patient Measurements: Height: 5\' 7"  (170.2 cm) Weight: 187 lb 14.4 oz (85.231 kg) IBW/kg (Calculated) : 66.1 Heparin Dosing Weight: 83.4kg  Vital Signs: Temp: 97.5 F (36.4 C) (08/31 1220) Temp Source: Oral (08/31 1220) BP: 129/90 mmHg (08/31 1600) Pulse Rate: 74 (08/31 1600)  Labs:  Recent Labs  04/29/15 1225  HGB 16.1  HCT 47.2  PLT 187  CREATININE 1.09  TROPONINI <0.03    Estimated Creatinine Clearance: 70.4 mL/min (by C-G formula based on Cr of 1.09).   Medical History: Past Medical History  Diagnosis Date  . Prostate cancer   . Hypertension     takes Amlodipine daily  . Hyperlipidemia     was on meds but hasn't been on in 73months;Dr.Nyland took him off  . Headache(784.0)   . Arthritis     left knee  . GERD (gastroesophageal reflux disease)     takes Nexium daily  . Hypothyroidism     takes Synthroid daily  . Urinary frequency   . Nocturia   . History of shingles      Assessment: 64 yom to the ED with CP. Pharmacy consulted to dose heparin for CP/ACS (no anticoag pta). Will plan ECHO to assess LVF and if normal plan stress myoview in AM per Cards note. CBC wnl. No bleed reported.  Goal of Therapy:  Heparin level 0.3-0.7 units/ml Monitor platelets by anticoagulation protocol: Yes   Plan:  Heparin 4000 unit bolus Heparin @1100  units/h 6h HL Daily HL/CBC  Elicia Lamp, PharmD Clinical Pharmacist Pager [phone] 04/29/2015 4:26 PM

## 2015-04-29 NOTE — Progress Notes (Signed)
PHARMACIST - PHYSICIAN ORDER COMMUNICATION  CONCERNING: P&T Medication Policy on Herbal Medications  DESCRIPTION:  This patient's order for: coenzyme q10 has been noted.  This product(s) is classified as an "herbal" or natural product. Due to a lack of definitive safety studies or FDA approval, nonstandard manufacturing practices, plus the potential risk of unknown drug-drug interactions while on inpatient medications, the Pharmacy and Therapeutics Committee does not permit the use of "herbal" or natural products of this type within [facility]   ACTION TAKEN: The pharmacy department is unable to verify this order at this time and your patient has been informed of this safety policy. Please reevaluate patient's clinical condition at discharge and address if the herbal or natural product(s) should be resumed at that time.  Joya San, PharmD Clinical Pharmacy Resident Pager # [phone] 04/29/2015 5:33 PM

## 2015-04-29 NOTE — Progress Notes (Signed)
ANTICOAGULATION CONSULT NOTE - Follow Up Consult  Pharmacy Consult for heparin Indication: chest pain/ACS   Labs:  Recent Labs  04/29/15 1225 04/29/15 1826 04/29/15 2239  HGB 16.1  --   --   HCT 47.2  --   --   PLT 187  --   --   LABPROT  --  15.0  --   INR  --  1.16  --   HEPARINUNFRC  --   --  0.31  CREATININE 1.09  --   --   TROPONINI <0.03  --   --       Assessment: 64yo male therapeutic on heparin with initial dosing for CP though at very low end of goal.  Goal of Therapy:  Heparin level 0.3-0.7 units/ml   Plan:  Will increase heparin gtt slightly to 1200 units/hr to prevent falling below goal and check level with am labs.  Wynona Neat, PharmD, BCPS  04/29/2015,11:09 PM

## 2015-04-29 NOTE — H&P (Signed)
Patient ID: Duane Reyes MRN: [id number], DOB/AGE: 1950-10-02   Admit date: 04/29/2015   Primary Physician: Sherrie Mustache, MD Primary Cardiologist: New  Pt. Profile:  Duane Reyes is a 64 y.o. male with a history of HTN and HLD who presented to [facility] today with chest pain.  Over the past month, the patient has felt more fatigued and more short of breath with exertion. He has never had chest pain before, but yesterday noticed a tightness in his chest that was associated with shortness of breath. It did not radiate and was not associated with nausea or diaphoresis. The chest pain was made worse by exertion and relieved with rest. He had an episode that lasted about 1 hour last night and it returned again this AM. He is currently chest pain free. He has never smoked cigarettes, does not drink and does not use illicit drugs. He has  seen a cardiologist for a incidental EKG abnormality remotely but was never followed. He has no family history of heart disease.      Problem List  Past Medical History  Diagnosis Date  . Prostate cancer   . Hypertension     takes Amlodipine daily  . Hyperlipidemia     was on meds but hasn't been on in 22months;Dr.Nyland took him off  . Headache(784.0)   . Arthritis     left knee  . GERD (gastroesophageal reflux disease)     takes Nexium daily  . Hypothyroidism     takes Synthroid daily  . Urinary frequency   . Nocturia   . History of shingles     Past Surgical History  Procedure Laterality Date  . Knee surgery Left 2012  . Tonsillectomy      as a child  . Cyst removed from right arm   2005  . [place] therapy  2013  . Colonoscopy    . Esophagogastroduodenoscopy    . Back surgery       Allergies  Allergies  Allergen Reactions  . Celebrex [Celecoxib]     unknown  . Topamax [Topiramate]     blurred vision, tired, ringing in ears, restless, confussion  . Vimovo [Naproxen-Esomeprazole] Itching and Rash      Home Medications  Prior to Admission medications   Medication Sig Start Date End Date Taking? Authorizing Provider  amLODipine (NORVASC) 5 MG tablet Take 5 mg by mouth daily.   Yes Historical Provider, MD  aspirin 325 MG tablet Take 325 mg by mouth once.   Yes Historical Provider, MD  aspirin 81 MG chewable tablet Chew 81 mg by mouth daily.   Yes Historical Provider, MD  b complex vitamins tablet Take 1 tablet by mouth daily.   Yes Historical Provider, MD  co-enzyme Q-10 30 MG capsule Take 30 mg by mouth 3 (three) times daily.   Yes Historical Provider, MD  esomeprazole (NEXIUM) 20 MG capsule Take 20 mg by mouth daily at 12 noon.   Yes Historical Provider, MD  hydroxypropyl methylcellulose (ISOPTO TEARS) 2.5 % ophthalmic solution Place 1 drop into both eyes as needed for dry eyes (dry eyes).    Yes Historical Provider, MD  levothyroxine (SYNTHROID, LEVOTHROID) 125 MCG tablet Take 125 mcg by mouth daily before breakfast.   Yes Historical Provider, MD  omega-3 acid ethyl esters (LOVAZA) 1 G capsule Take 3 g by mouth daily.   Yes Historical Provider, MD  pravastatin (PRAVACHOL) 20 MG tablet Take 20 mg by mouth daily.  Yes Historical Provider, MD  esomeprazole (NEXIUM) 40 MG capsule Take 40 mg by mouth daily before breakfast.    Historical Provider, MD  gabapentin (NEURONTIN) 300 MG capsule Take 300-600 mg by mouth 3 (three) times daily. 300 mg twice daily, then 600 mg at bedtime    Historical Provider, MD  HYDROcodone-acetaminophen (NORCO/VICODIN) 5-325 MG per tablet Take 1 tablet by mouth 2 (two) times daily.    Historical Provider, MD  Ibuprofen-Famotidine (DUEXIS) 800-26.6 MG TABS Take 1 tablet by mouth 2 (two) times daily.    Historical Provider, MD  levothyroxine (SYNTHROID, LEVOTHROID) 175 MCG tablet Take 175 mcg by mouth daily before breakfast.    Historical Provider, MD  methocarbamol (ROBAXIN) 500 MG tablet Take 1 tablet (500 mg total) by mouth every 6 (six) hours as  needed. Patient taking differently: Take 500 mg by mouth as needed for muscle spasms.  03/15/13   Kristeen Miss, MD  oseltamivir (TAMIFLU) 75 MG capsule Take 1 capsule (75 mg total) by mouth every 12 (twelve) hours. 09/09/13   Fransico Meadow, PA-C  oxyCODONE-acetaminophen (PERCOCET/ROXICET) 5-325 MG per tablet Take 1-2 tablets by mouth every 4 (four) hours as needed for pain. 03/15/13   Kristeen Miss, MD    Family History  Family History  Problem Relation Age of Onset  . Heart attack Father   . Heart disease Father    Family Status  Relation Status Death Age  . Father Alive      Social History  Social History   Social History  . Marital Status: Married    Spouse Name: N/A  . Number of Children: N/A  . Years of Education: N/A   Occupational History  . Not on file.   Social History Main Topics  . Smoking status: Never Smoker   . Smokeless tobacco: Not on file  . Alcohol Use: No  . Drug Use: No  . Sexual Activity: Yes   Other Topics Concern  . Not on file   Social History Narrative     All other systems reviewed and are otherwise negative except as noted above.  Physical Exam  Blood pressure 129/90, pulse 88, temperature 97.5 F (36.4 C), temperature source Oral, resp. rate 18, height 5\' 7"  (1.702 m), weight 187 lb 14.4 oz (85.231 kg), SpO2 99 %.  General: Pleasant, NAD Psych: Normal affect. Neuro: Alert and oriented X 3. Moves all extremities spontaneously. HEENT: Normal  Neck: Supple without bruits or JVD. Lungs:  Resp regular and unlabored, CTA. Heart: RRR no s3, s4, or murmurs. Abdomen: Soft, non-tender, non-distended, BS + x 4.  Extremities: No clubbing, cyanosis or edema. DP/PT/Radials 2+ and equal bilaterally.  Labs   Recent Labs  04/29/15 1225  TROPONINI <0.03   Lab Results  Component Value Date   WBC 6.2 04/29/2015   HGB 16.1 04/29/2015   HCT 47.2 04/29/2015   MCV 90.6 04/29/2015   PLT 187 04/29/2015     Recent Labs Lab 04/29/15 1225   NA 140  K 3.8  CL 107  CO2 26  BUN 11  CREATININE 1.09  CALCIUM 9.4  GLUCOSE 97   No results found for: CHOL, HDL, LDLCALC, TRIG No results found for: DDIMER   Radiology/Studies  Dg Chest 2 View  04/29/2015   CLINICAL DATA:  64 year old male with shortness of breath chest pain in weakness. Initial encounter.  EXAM: CHEST  2 VIEW  COMPARISON:  03/05/2013.  FINDINGS: Moderate to severe thoracolumbar scoliosis. Stable visualized osseous structures. Stable lung  volumes. Stable cardiac size and mediastinal contours. Visualized tracheal air column is within normal limits. No pneumothorax, pulmonary edema, pleural effusion or acute pulmonary opacity.  IMPRESSION: No acute cardiopulmonary abnormality.  Chronic severe scoliosis.   Electronically Signed   By: Genevie Ann M.D.   On: 04/29/2015 13:02    ECG  NSR HR 90. IRBBB. Inferior infarct  ASSESSMENT AND PLAN Duane Reyes is a 64 y.o. male with a history of HTN and HLD who presented to [facility] today with chest pain.  Chest pain - his sx are concerning for CAD. He has RFs including HLD and HTN and now with worsening fatigue and exertional CP and SOB.  He has a family history of CAD but late in life.  His EKG shows inferior infarct.   -- POC troponin negative. Troponin I x1 negative, ECG with no acute ST or TW changes -- Will admit to telemetry for observation -- Cycle Troponin and serial ECGs --Start IV Heparin gtt --Continue ASA/statin --Hold on starting BB for stress test in am --check 2D echo today and if normal then plan for treadmill nuclear stress test tomorrow AM. NPO after midnight  HTN- well controlled on amlodipine  HLD- continue statin    Hypothyroidism- continue synthroid. Will order a TSH  Signed, Sueanne Margarita, PA-C 04/29/2015, 4:00 PM  Pager [phone]  I have personally and independently interviewed and examined patient and agree with note as outlined above by Angelena Form PA with minor changes.   Will rule out with serial enzymes.  ASA/IV Heparin gtt/statin.  2D echo today to assess LVF and if normal then plan stress myoview in am.  If RWMAs are present then cath.    Signed: Fransico Him, MD [facility] 04/29/2015

## 2015-04-29 NOTE — ED Notes (Signed)
Patient placed back on monitor, continuous pulse oximetry and blood pressure cuff; visitors at bedside

## 2015-04-29 NOTE — ED Notes (Signed)
Echo technician at bedside.

## 2015-04-30 ENCOUNTER — Observation Stay (HOSPITAL_COMMUNITY): Payer: BLUE CROSS/BLUE SHIELD

## 2015-04-30 DIAGNOSIS — R079 Chest pain, unspecified: Secondary | ICD-10-CM | POA: Diagnosis not present

## 2015-04-30 DIAGNOSIS — R072 Precordial pain: Secondary | ICD-10-CM

## 2015-04-30 DIAGNOSIS — I10 Essential (primary) hypertension: Secondary | ICD-10-CM | POA: Diagnosis present

## 2015-04-30 DIAGNOSIS — M1990 Unspecified osteoarthritis, unspecified site: Secondary | ICD-10-CM | POA: Diagnosis not present

## 2015-04-30 DIAGNOSIS — E785 Hyperlipidemia, unspecified: Secondary | ICD-10-CM | POA: Diagnosis present

## 2015-04-30 LAB — CBC
HEMATOCRIT: 44.7 % (ref 39.0–52.0)
HEMOGLOBIN: 15.2 g/dL (ref 13.0–17.0)
MCH: 30.6 pg (ref 26.0–34.0)
MCHC: 34 g/dL (ref 30.0–36.0)
MCV: 90.1 fL (ref 78.0–100.0)
Platelets: 158 10*3/uL (ref 150–400)
RBC: 4.96 MIL/uL (ref 4.22–5.81)
RDW: 13.5 % (ref 11.5–15.5)
WBC: 6.4 10*3/uL (ref 4.0–10.5)

## 2015-04-30 LAB — LIPID PANEL
Cholesterol: 166 mg/dL (ref 0–200)
HDL: 42 mg/dL (ref >40)
LDL Cholesterol: 74 mg/dL (ref 0–99)
TRIGLYCERIDES: 249 mg/dL — AB (ref <150)
Total CHOL/HDL Ratio: 4 RATIO
VLDL: 50 mg/dL — ABNORMAL HIGH (ref 0–40)

## 2015-04-30 LAB — BASIC METABOLIC PANEL
ANION GAP: 7 (ref 5–15)
BUN: 15 mg/dL (ref 6–20)
CALCIUM: 8.7 mg/dL — AB (ref 8.9–10.3)
CHLORIDE: 105 mmol/L (ref 101–111)
CO2: 25 mmol/L (ref 22–32)
Creatinine, Ser: 1.03 mg/dL (ref 0.61–1.24)
GFR calc non Af Amer: >60 mL/min (ref >60)
Glucose, Bld: 95 mg/dL (ref 65–99)
Potassium: 3.9 mmol/L (ref 3.5–5.1)
Sodium: 137 mmol/L (ref 135–145)

## 2015-04-30 LAB — TROPONIN I: Troponin I: <0.03 ng/mL (ref <0.031)

## 2015-04-30 LAB — HEMOGLOBIN A1C
HEMOGLOBIN A1C: 5.8 % — AB (ref 4.8–5.6)
Mean Plasma Glucose: 120 mg/dL

## 2015-04-30 LAB — PROTIME-INR
INR: 1.07 (ref 0.00–1.49)
Prothrombin Time: 14.1 seconds (ref 11.6–15.2)

## 2015-04-30 LAB — HEPARIN LEVEL (UNFRACTIONATED): HEPARIN UNFRACTIONATED: 0.33 [IU]/mL (ref 0.30–0.70)

## 2015-04-30 MED ORDER — OMEPRAZOLE 20 MG PO CPDR: 20.0000 mg | DELAYED_RELEASE_CAPSULE | Freq: Two times a day (BID) | ORAL | Status: DC

## 2015-04-30 MED ORDER — REGADENOSON 0.4 MG/5ML IV SOLN
0.4000 mg | Freq: Once | INTRAVENOUS | Status: AC
  Administered 2015-04-30: 0.4 mg via INTRAVENOUS

## 2015-04-30 MED ORDER — PRAVASTATIN SODIUM 20 MG PO TABS: 20.0000 mg | ORAL_TABLET | Freq: Every day | ORAL | Status: DC

## 2015-04-30 MED ORDER — REGADENOSON 0.4 MG/5ML IV SOLN
INTRAVENOUS | Status: AC
  Administered 2015-04-30: 0.4 mg via INTRAVENOUS
  Filled 2015-04-30: qty 5

## 2015-04-30 MED ORDER — SODIUM CHLORIDE 0.9 % IJ SOLN
80.0000 mg | INTRAVENOUS | Status: AC
  Administered 2015-04-30: 80 mg via INTRAVENOUS

## 2015-04-30 MED ORDER — TECHNETIUM TC 99M SESTAMIBI GENERIC - CARDIOLITE
10.0000 | Freq: Once | INTRAVENOUS | Status: AC | PRN
  Administered 2015-04-30: 10 via INTRAVENOUS

## 2015-04-30 MED ORDER — TECHNETIUM TC 99M SESTAMIBI GENERIC - CARDIOLITE: 30.0000 | Freq: Once | INTRAVENOUS | Status: DC | PRN

## 2015-04-30 MED ORDER — SODIUM CHLORIDE 0.9 % IV BOLUS (SEPSIS)
250.0000 mL | Freq: Once | INTRAVENOUS | Status: AC
  Administered 2015-04-30: 250 mL via INTRAVENOUS

## 2015-04-30 NOTE — Progress Notes (Signed)
Subjective: No complaints overnight  Objective: Vital signs in last 24 hours: Temp:  [97.4 F (36.3 C)-97.7 F (36.5 C)] 97.6 F (36.4 C) (09/01 0517) Pulse Rate:  [46-90] 65 (09/01 0947) Resp:  [13-20] 20 (09/01 0517) BP: (72-153)/(58-90) 101/58 mmHg (09/01 0947) SpO2:  [96 %-100 %] 96 % (09/01 0517) Weight:  [184 lb 4.9 oz (83.6 kg)-187 lb 14.4 oz (85.231 kg)] 184 lb 4.9 oz (83.6 kg) (09/01 0517) Weight change:  Last BM Date: 04/28/15 Intake/Output from previous day: 08/31 0701 - 09/01 0700 In: 300 [P.O.:300] Out: 440 [Urine:440] Intake/Output this shift:    PE: General:Pleasant affect, NAD Skin:Warm and dry, brisk capillary refill HEENT:normocephalic, sclera clear, mucus membranes moist Heart:S1S2 RRR without murmur, gallup, rub or click Lungs:clear without rales, rhonchi, or wheezes WOE:HOZY, non tender, + BS, do not palpate liver spleen or masses Ext:no lower ext edema, 2+ pedal pulses, 2+ radial pulses Neuro:alert and oriented, MAE, follows commands, + facial symmetry  tele:  SR with PVCs on occ.  Lab Results:  Recent Labs  04/29/15 1225 04/30/15 0437  WBC 6.2 6.4  HGB 16.1 15.2  HCT 47.2 44.7  PLT 187 158   BMET  Recent Labs  04/29/15 1225 04/30/15 0437  NA 140 137  K 3.8 3.9  CL 107 105  CO2 26 25  GLUCOSE 97 95  BUN 11 15  CREATININE 1.09 1.03  CALCIUM 9.4 8.7*    Recent Labs  04/29/15 1225 04/30/15 0437  TROPONINI <0.03 <0.03    Lab Results  Component Value Date   CHOL 166 04/30/2015   HDL 42 04/30/2015   LDLCALC 74 04/30/2015   TRIG 249* 04/30/2015   CHOLHDL 4.0 04/30/2015   Lab Results  Component Value Date   HGBA1C 5.8* 04/29/2015     Lab Results  Component Value Date   TSH 0.510 04/29/2015    Hepatic Function Panel No results for input(s): PROT, ALBUMIN, AST, ALT, ALKPHOS, BILITOT, BILIDIR, IBILI in the last 72 hours.  Recent Labs  04/30/15 0437  CHOL 166   No results for input(s): PROTIME in the  last 72 hours.     Studies/Results: Dg Chest 2 View  04/29/2015   CLINICAL DATA:  64 year old male with shortness of breath chest pain in weakness. Initial encounter.  EXAM: CHEST  2 VIEW  COMPARISON:  03/05/2013.  FINDINGS: Moderate to severe thoracolumbar scoliosis. Stable visualized osseous structures. Stable lung volumes. Stable cardiac size and mediastinal contours. Visualized tracheal air column is within normal limits. No pneumothorax, pulmonary edema, pleural effusion or acute pulmonary opacity.  IMPRESSION: No acute cardiopulmonary abnormality.  Chronic severe scoliosis.   Electronically Signed   By: Genevie Ann M.D.   On: 04/29/2015 13:02    ECHO: Study Conclusions  - Left ventricle: The cavity size was normal. Wall thickness was normal. Systolic function was normal. The estimated ejection fraction was in the range of 55% to 60%. Wall motion was normal; there were no regional wall motion abnormalities.  Medications: I have reviewed the patient's current medications. Scheduled Meds: . amLODipine  5 mg Oral Daily  . aspirin  324 mg Oral Once  . aspirin  81 mg Oral Daily  . levothyroxine  125 mcg Oral QAC breakfast  . omega-3 acid ethyl esters  3 g Oral Daily  . pantoprazole  40 mg Oral QAC breakfast  . pravastatin  20 mg Oral QHS  . sodium chloride  3 mL Intravenous Q12H  Continuous Infusions: . heparin 1,200 Units/hr (04/29/15 2310)   PRN Meds:.sodium chloride, acetaminophen, methocarbamol, nitroGLYCERIN, ondansetron (ZOFRAN) IV, sodium chloride  Assessment/Plan: Principal Problem:   Chest pain- negative MI, Echo with normal EF, for lexiscan myoview.    Active Problems:   HTN (hypertension)- stable   Hyperlipidemia LDL goal <100- has been off statin- LDL 50 TG though 249      Time spent with pt. :15 minutes. [facility] R  Nurse Practitioner Certified Pager [phone] or after 5pm and on weekends call [phone] 04/30/2015, 10:36 AM  History and all data above  reviewed.  Patient examined.  I agree with the findings as above.  No further chest pressure.  Breathing OK.   The patient exam reveals COR:RRR with PVCs  ,  Lungs: Clear  ,  Abd: Positive bowel sounds, no rebound no guarding, Ext No edema  .  All available labs, radiology testing, previous records reviewed. Agree with documented assessment and plan.  Chest pain:  Lexiscan Myoview today with results pending.  If negative no further cardiac work up.  Echo and labs OK.  PVDs but not structural heart disease.    Jeneen Rinks Sheriann Newmann  11:43 AM  04/30/2015

## 2015-04-30 NOTE — Discharge Instructions (Signed)
Heart Healthy diet  Your tests were normal, stress test no lack of blood supply to the heart.  Echo/ultrasound of the heart was normal.  No heart attack.  Lipid Panel     Component Value Date/Time   CHOL 166 04/30/2015 0437   TRIG 249* 04/30/2015 0437   HDL 42 04/30/2015 0437   CHOLHDL 4.0 04/30/2015 0437   VLDL 50* 04/30/2015 0437   LDLCALC 74 04/30/2015 0437

## 2015-04-30 NOTE — Progress Notes (Signed)
lexsiscan completed, results to follow.  + hypotension to 77 systolic, and SB to 43, pt symptomatic with dizziness, visual change and pale.  250 cc NS given, and aminophylline 80 mg given with resolution of hypotension and brady.

## 2015-04-30 NOTE — Progress Notes (Signed)
Stress is negative for ischemia, neg MI, normal echo.  Pt will follow up with PCP and with [name] on prn basis.

## 2015-04-30 NOTE — Progress Notes (Signed)
ANTICOAGULATION CONSULT NOTE - Follow Up Consult  Pharmacy Consult for heparin  Indication: chest pain/ACS  Allergies  Allergen Reactions  . Celebrex [Celecoxib]     unknown  . Topamax [Topiramate]     blurred vision, tired, ringing in ears, restless, confussion  . Vimovo [Naproxen-Esomeprazole] Itching and Rash    Patient Measurements: Height: 5' 7.5" (171.5 cm) Weight: 184 lb 4.9 oz (83.6 kg) IBW/kg (Calculated) : 67.25 Heparin Dosing Weight: 83kg  Vital Signs: Temp: 97.6 F (36.4 C) (09/01 0517) Temp Source: Oral (09/01 0517) BP: 101/58 mmHg (09/01 0947) Pulse Rate: 65 (09/01 0947)  Labs:  Recent Labs  04/29/15 1225 04/29/15 1826 04/29/15 2239 04/30/15 0437  HGB 16.1  --   --  15.2  HCT 47.2  --   --  44.7  PLT 187  --   --  158  LABPROT  --  15.0  --  14.1  INR  --  1.16  --  1.07  HEPARINUNFRC  --   --  0.31 0.33  CREATININE 1.09  --   --  1.03  TROPONINI <0.03  --   --  <0.03    Estimated Creatinine Clearance: 74.6 mL/min (by C-G formula based on Cr of 1.03).   Medications:  Scheduled:  . amLODipine  5 mg Oral Daily  . aspirin  324 mg Oral Once  . aspirin  81 mg Oral Daily  . levothyroxine  125 mcg Oral QAC breakfast  . omega-3 acid ethyl esters  3 g Oral Daily  . pantoprazole  40 mg Oral QAC breakfast  . pravastatin  20 mg Oral QHS  . sodium chloride  3 mL Intravenous Q12H   Infusions:  . heparin 1,200 Units/hr (04/29/15 2310)    Assessment: 64 yo male with CP on heparin for r/o ACS. Heparin level is 0.33 and at goal. Troponin is negative, EF normal and patient noted for stress test today.  Goal of Therapy:  Heparin level 0.3-0.7 units/ml Monitor platelets by anticoagulation protocol: Yes   Plan:  -No heparin changes needed -Daily heparin level and CBC while on heparin   Hildred Laser, Pharm D 04/30/2015 10:49 AM

## 2015-04-30 NOTE — Discharge Summary (Signed)
Physician Discharge Summary       Patient ID: Duane Reyes MRN: [id number] DOB/AGE: [birth year] 64 y.o.  Admit date: 04/29/2015 Discharge date: 04/30/2015 Primary Cardiologist:Dr. Turner   Discharge Diagnoses:  Principal Problem:   Chest pain, neg. MI, neg. stress test possible GI Active Problems:   HTN (hypertension)   Hyperlipidemia LDL goal <100   Discharged Condition: good  Procedures: none  Hospital Course:  64 y.o. male with a history of HTN and HLD who presented to [facility] 04/29/15 for chest pain.  Over the past month, the patient has felt more fatigued and more short of breath with exertion. He has never had chest pain before, but yesterday noticed a tightness in his chest that was associated with shortness of breath. It did not radiate and was not associated with nausea or diaphoresis. The chest pain was made worse by exertion and relieved with rest.  Pt admitted on IV heparin and underwent 2D Echo with normal EF.  Lexiscan myoview was negative for ischemia, he did react to the [place] with hypotension.  Resolved with fluids and reversal at end of study.  Troponins were negative.  Pt was seen and evaluated by Dr. Percival Spanish on day and found stable for discharge.  Stress test was negative, this may be GI pain.  We increased his prilosec to twice a day.  He was asked to follow up with his PCP for further eval.  Cardiology follow up only if needed.  We did place back on Pravachol but at 20 mg.   Consults: None  Significant Diagnostic Studies:  BMP Latest Ref Rng 04/30/2015 04/29/2015 03/13/2013  Glucose 65 - 99 mg/dL 95 97 100(H)  BUN 6 - 20 mg/dL 15 11 17   Creatinine 0.61 - 1.24 mg/dL 1.03 1.09 1.00  Sodium 135 - 145 mmol/L 137 140 138  Potassium 3.5 - 5.1 mmol/L 3.9 3.8 3.8  Chloride 101 - 111 mmol/L 105 107 106  CO2 22 - 32 mmol/L 25 26 27   Calcium 8.9 - 10.3 mg/dL 8.7(L) 9.4 7.8(L)   CBC Latest Ref Rng 04/30/2015 04/29/2015 03/13/2013  WBC 4.0 - 10.5 K/uL 6.4 6.2  8.3  Hemoglobin 13.0 - 17.0 g/dL 15.2 16.1 10.6(L)  Hematocrit 39.0 - 52.0 % 44.7 47.2 31.3(L)  Platelets 150 - 400 K/uL 158 187 127(L)     Lipid Panel     Component Value Date/Time   CHOL 166 04/30/2015 0437   TRIG 249* 04/30/2015 0437   HDL 42 04/30/2015 0437   CHOLHDL 4.0 04/30/2015 0437   VLDL 50* 04/30/2015 0437   LDLCALC 74 04/30/2015 0437    Troponin <0.03 X 3  BNP 30  HgbA1c 5.8 TSH 0.510  2 D Echo: Study Conclusions - Left ventricle: The cavity size was normal. Wall thickness was normal. Systolic function was normal. The estimated ejection fraction was in the range of 55% to 60%. Wall motion was normal; there were no regional wall motion abnormalities.   Lexiscan myoview: FINDINGS: Perfusion: No decreased activity in the left ventricle on stress imaging to suggest reversible ischemia or infarction. Mild apical thinning.  Wall Motion: Normal left ventricular wall motion. No left ventricular dilation.  Left Ventricular Ejection Fraction: 56 %  End diastolic volume 97 ml  End systolic volume 43 ml  IMPRESSION: 1. No reversible ischemia or infarction.  2. Normal left ventricular wall motion.  3. Left ventricular ejection fraction 56%  4. Low-risk stress test findings*.  CHEST 2 VIEW COMPARISON: 03/05/2013. FINDINGS: Moderate to  severe thoracolumbar scoliosis. Stable visualized osseous structures. Stable lung volumes. Stable cardiac size and mediastinal contours. Visualized tracheal air column is within normal limits. No pneumothorax, pulmonary edema, pleural effusion or acute pulmonary opacity. IMPRESSION: No acute cardiopulmonary abnormality. Chronic severe scoliosis.   Discharge Exam: Blood pressure 133/71, pulse 70, temperature 97.8 F (36.6 C), temperature source Oral, resp. rate 18, height 5' 7.5" (1.715 m), weight 184 lb 4.9 oz (83.6 kg), SpO2 97 %.  Disposition: 01-Home or Self Care     Medication List      TAKE these medications        amLODipine 5 MG tablet  Commonly known as:  NORVASC  Take 5 mg by mouth daily.     aspirin 81 MG chewable tablet  Chew 81 mg by mouth daily.     b complex vitamins tablet  Take 1 tablet by mouth daily.     co-enzyme Q-10 30 MG capsule  Take 30 mg by mouth 3 (three) times daily.     hydroxypropyl methylcellulose / hypromellose 2.5 % ophthalmic solution  Commonly known as:  ISOPTO TEARS / GONIOVISC  Place 1 drop into both eyes as needed for dry eyes (dry eyes).     levothyroxine 125 MCG tablet  Commonly known as:  SYNTHROID, LEVOTHROID  Take 125 mcg by mouth daily before breakfast.     omega-3 acid ethyl esters 1 G capsule  Commonly known as:  LOVAZA  Take 3 g by mouth daily.     omeprazole 20 MG capsule  Commonly known as:  PRILOSEC  Take 1 capsule (20 mg total) by mouth 2 (two) times daily before a meal.     pravastatin 20 MG tablet  Commonly known as:  PRAVACHOL  Take 1 tablet (20 mg total) by mouth at bedtime.     tiZANidine 4 MG capsule  Commonly known as:  ZANAFLEX  Take 4 mg by mouth 3 (three) times daily as needed for muscle spasms.       Follow-up Information    Schedule an appointment as soon as possible for a visit with Sherrie Mustache, MD.   Specialty:  Family Medicine   Why:  and be seen, may need further GI work up.     Contact information:   [address] [place] [address] [phone]       Follow up with Sueanne Margarita, MD.   Specialty:  Cardiology   Why:  As needed-  if recurrent chest pain not GI   Contact information:   [address] [address] [phone]        Discharge Instructions:Heart Healthy diet  Your tests were normal, stress test no lack of blood supply to the heart.  Echo/ultrasound of the heart was normal.  No heart attack.   Signed: Isaiah Serge Nurse Practitioner-Certified [facility]: [facility] 04/30/2015, 4:00 PM  Time spent on  discharge : >30 minutes.   Patient seen and examined.  Plan as discussed in my rounding note for today and outlined above. Minus Breeding  04/30/2015  4:45 PM

## 2015-11-28 HISTORY — PX: CATARACT EXTRACTION W/ INTRAOCULAR LENS IMPLANT: SHX1309

## 2016-01-12 ENCOUNTER — Ambulatory Visit (INDEPENDENT_AMBULATORY_CARE_PROVIDER_SITE_OTHER): Payer: Medicare HMO | Admitting: Cardiology

## 2016-01-12 ENCOUNTER — Encounter: Payer: Self-pay | Admitting: Cardiology

## 2016-01-12 VITALS — BP 140/90 | HR 59 | Ht 68.0 in | Wt 188.1 lb

## 2016-01-12 DIAGNOSIS — R0602 Shortness of breath: Secondary | ICD-10-CM

## 2016-01-12 DIAGNOSIS — I10 Essential (primary) hypertension: Secondary | ICD-10-CM | POA: Diagnosis not present

## 2016-01-12 DIAGNOSIS — R079 Chest pain, unspecified: Secondary | ICD-10-CM

## 2016-01-12 DIAGNOSIS — E785 Hyperlipidemia, unspecified: Secondary | ICD-10-CM

## 2016-01-12 DIAGNOSIS — I493 Ventricular premature depolarization: Secondary | ICD-10-CM | POA: Insufficient documentation

## 2016-01-12 NOTE — Patient Instructions (Signed)
Medication Instructions:  Your physician recommends that you continue on your current medications as directed. Please refer to the Current Medication list given to you today.   Labwork: None  Testing/Procedures: [name] recommends you have a CARDIAC CTA.  Your physician has recommended that you wear a holter monitor. Holter monitors are medical devices that record the heart's electrical activity. Doctors most often use these monitors to diagnose arrhythmias. Arrhythmias are problems with the speed or rhythm of the heartbeat. The monitor is a small, portable device. You can wear one while you do your normal daily activities. This is usually used to diagnose what is causing palpitations/syncope (passing out).  Follow-Up: Your physician recommends that you schedule a follow-up appointment AS NEEDED with [name] pending study results.  Any Other Special Instructions Will Be Listed Below (If Applicable).     If you need a refill on your cardiac medications before your next appointment, please call your pharmacy.

## 2016-01-12 NOTE — Progress Notes (Signed)
Cardiology Office Note    Date:  01/12/2016   ID:  Duane Reyes, DOB September 23, 1950, MRN [id number]  PCP:  Sherrie Mustache, MD  Cardiologist:  Fransico Him, MD   Chief Complaint  Patient presents with  . Chest Pain    pt states nothing drastic   . Shortness of Breath    with any exertion    History of Present Illness:  Duane Reyes is a 65 y.o. male with a history of HTN, dyslipidemia and GERD who presented to [facility] with complaints of chest pain 03/2015.  He ruled out for MI and 2D echo showed normal LVF and nuclear stress test showed no ischemia.  It was felt his pain was from GERD and was placed on PPI.  He says since that hospitalization he has felt the same.  He says that he has had exertional fatigue and DOE.  His thyroid has been a little out of wack but was adjusted and has not made a difference.  He has had some mild twinges of CP rarely in the midsternal region.  He says that when he tries to catch his breath he will feel some pressure in his chest.      Past Medical History  Diagnosis Date  . Prostate cancer ([place])   . Hypertension     takes Amlodipine daily  . Hyperlipidemia     was on meds but hasn't been on in 8months;Dr.Nyland took him off  . Headache(784.0)   . Arthritis     left knee  . GERD (gastroesophageal reflux disease)     takes Nexium daily  . Hypothyroidism     takes Synthroid daily  . Urinary frequency   . Nocturia   . History of shingles   . PVC (premature ventricular contraction) 01/12/2016    Past Surgical History  Procedure Laterality Date  . Knee surgery Left 2012  . Tonsillectomy      as a child  . Cyst removed from right arm   2005  . [place] therapy  2013  . Colonoscopy    . Esophagogastroduodenoscopy    . Back surgery      Current Medications: Outpatient Prescriptions Prior to Visit  Medication Sig Dispense Refill  . amLODipine (NORVASC) 5 MG tablet Take 5 mg by mouth daily.    [name] aspirin 81 MG chewable tablet Chew 81 mg  by mouth daily.    [name] b complex vitamins tablet Take 1 tablet by mouth daily.    [name] co-enzyme Q-10 30 MG capsule Take 30 mg by mouth 3 (three) times daily.    . hydroxypropyl methylcellulose (ISOPTO TEARS) 2.5 % ophthalmic solution Place 1 drop into both eyes as needed for dry eyes (dry eyes).     [name] omega-3 acid ethyl esters (LOVAZA) 1 G capsule Take 3 g by mouth daily.    [name] omeprazole (PRILOSEC) 20 MG capsule Take 1 capsule (20 mg total) by mouth 2 (two) times daily before a meal. 60 capsule 1  . tiZANidine (ZANAFLEX) 4 MG capsule Take 4 mg by mouth 3 (three) times daily as needed for muscle spasms.    [name] levothyroxine (SYNTHROID, LEVOTHROID) 125 MCG tablet Take 125 mcg by mouth daily before breakfast. Reported on 01/12/2016    . pravastatin (PRAVACHOL) 20 MG tablet Take 1 tablet (20 mg total) by mouth at bedtime. (Patient not taking: Reported on 01/12/2016) 30 tablet 1   No facility-administered medications prior to visit.     Allergies:  Celebrex; Topamax; Pitavastatin; and Vimovo   Social History   Social History  . Marital Status: Married    Spouse Name: N/A  . Number of Children: N/A  . Years of Education: N/A   Social History Main Topics  . Smoking status: Never Smoker   . Smokeless tobacco: None  . Alcohol Use: No  . Drug Use: No  . Sexual Activity: Yes   Other Topics Concern  . None   Social History Narrative     Family History:  The patient's family history includes Heart attack in his father; Heart disease in his father.   ROS:   Please see the history of present illness.    ROS All other systems reviewed and are negative.   PHYSICAL EXAM:   VS:  BP 140/90 mmHg  Pulse 59  Ht 5\' 8"  (1.727 m)  Wt 188 lb 1.9 oz (85.331 kg)  BMI 28.61 kg/m2  SpO2 97%   GEN: Well nourished, well developed, in no acute distress HEENT: normal Neck: no JVD, carotid bruits, or masses Cardiac: RRR; no murmurs, rubs, or gallops,no edema.  Intact distal pulses bilaterally.    Respiratory:  clear to auscultation bilaterally, normal work of breathing GI: soft, nontender, nondistended, + BS MS: no deformity or atrophy Skin: warm and dry, no rash Neuro:  Alert and Oriented x 3, Strength and sensation are intact Psych: euthymic mood, full affect  Wt Readings from Last 3 Encounters:  01/12/16 188 lb 1.9 oz (85.331 kg)  04/30/15 184 lb 4.9 oz (83.6 kg)  09/09/13 179 lb 9 oz (81.449 kg)      Studies/Labs Reviewed:   EKG:  EKG is  ordered today and showed NSR with trigeminal PVC's  And IRBBB  Recent Labs: 04/29/2015: B Natriuretic Peptide 30.0; Magnesium 2.0; TSH 0.510 04/30/2015: BUN 15; Creatinine, Ser 1.03; Hemoglobin 15.2; Platelets 158; Potassium 3.9; Sodium 137   Lipid Panel    Component Value Date/Time   CHOL 166 04/30/2015 0437   TRIG 249* 04/30/2015 0437   HDL 42 04/30/2015 0437   CHOLHDL 4.0 04/30/2015 0437   VLDL 50* 04/30/2015 0437   LDLCALC 74 04/30/2015 0437    Additional studies/ records that were reviewed today include:  none    ASSESSMENT:    1. SOB (shortness of breath)   2. Chest pain, unspecified chest pain type   3. Essential hypertension   4. Hyperlipidemia LDL goal <100   5. PVC (premature ventricular contraction)      PLAN:  In order of problems listed above:  1.  DOE and exertional fatigue with normal nuclear stress test 04/2015 and normal 2D echo.  I discussed with him that 15% of patients can have a normal nuclear stress test and still have CAD.  I recommend that we get a coronary CTA with morphology and calcium score to evaluate further.   2.  CP that is less than at time of stress test 3.  HTN - BP borderline controlled.  Continue amlodipine 4.  Dyslipidemia - continue Lovaza/statin 5.  PVCs - trigeminal on EKG and ? Whether this is causing his SOB.  I will get a 24 hour Holter to assess PVC load.   Medication Adjustments/Labs and Tests Ordered: Current medicines are reviewed at length with the patient today.   Concerns regarding medicines are outlined above.  Medication changes, Labs and Tests ordered today are listed in the Patient Instructions below.  There are no Patient Instructions on file for this visit.  Signed, Fransico Him, MD  01/12/2016 2:03 PM    [address], [place], [place]  [address] Phone: [phone]; Fax: [phone]

## 2016-01-15 ENCOUNTER — Telehealth: Payer: Self-pay | Admitting: Cardiology

## 2016-01-15 NOTE — Telephone Encounter (Signed)
01-15-16 Lvm to call and schedule Cardiac Ct/saf

## 2016-01-18 ENCOUNTER — Encounter: Payer: Self-pay | Admitting: Cardiology

## 2016-01-18 ENCOUNTER — Other Ambulatory Visit: Payer: Self-pay | Admitting: *Deleted

## 2016-01-18 ENCOUNTER — Telehealth: Payer: Self-pay | Admitting: *Deleted

## 2016-01-18 ENCOUNTER — Ambulatory Visit (INDEPENDENT_AMBULATORY_CARE_PROVIDER_SITE_OTHER): Payer: Medicare HMO

## 2016-01-18 DIAGNOSIS — R0602 Shortness of breath: Secondary | ICD-10-CM

## 2016-01-18 DIAGNOSIS — R079 Chest pain, unspecified: Secondary | ICD-10-CM

## 2016-01-18 DIAGNOSIS — I493 Ventricular premature depolarization: Secondary | ICD-10-CM

## 2016-01-18 NOTE — Telephone Encounter (Signed)
MESSAGE LEFT AT [name] OFFICE PATIENT RETURNING CALL FROM RECORDS - Duane Reyes F> CALLED PATIENT TO SCHEDULE CARDIAC CT FORWARD TO [name]

## 2016-01-19 ENCOUNTER — Other Ambulatory Visit: Payer: Medicare HMO

## 2016-01-20 ENCOUNTER — Other Ambulatory Visit (INDEPENDENT_AMBULATORY_CARE_PROVIDER_SITE_OTHER): Payer: Medicare HMO | Admitting: *Deleted

## 2016-01-20 DIAGNOSIS — R0602 Shortness of breath: Secondary | ICD-10-CM | POA: Diagnosis not present

## 2016-01-20 DIAGNOSIS — R079 Chest pain, unspecified: Secondary | ICD-10-CM

## 2016-01-20 LAB — BASIC METABOLIC PANEL
BUN: 12 mg/dL (ref 7–25)
CALCIUM: 9.1 mg/dL (ref 8.6–10.3)
CHLORIDE: 106 mmol/L (ref 98–110)
CO2: 25 mmol/L (ref 20–31)
CREATININE: 1.12 mg/dL (ref 0.70–1.25)
GLUCOSE: 91 mg/dL (ref 65–99)
Potassium: 4.2 mmol/L (ref 3.5–5.3)
Sodium: 140 mmol/L (ref 135–146)

## 2016-01-20 NOTE — Addendum Note (Signed)
Addended by: Eulis Foster on: 01/20/2016 09:23 AM   Modules accepted: Orders

## 2016-01-21 ENCOUNTER — Encounter (HOSPITAL_COMMUNITY): Payer: Self-pay

## 2016-01-21 ENCOUNTER — Ambulatory Visit (HOSPITAL_COMMUNITY)
Admission: RE | Admit: 2016-01-21 | Discharge: 2016-01-21 | Disposition: A | Discharge status: Home / Self Care | Payer: Medicare HMO | Source: Ambulatory Visit | Attending: Cardiology | Admitting: Cardiology

## 2016-01-21 DIAGNOSIS — R0602 Shortness of breath: Secondary | ICD-10-CM | POA: Diagnosis not present

## 2016-01-21 DIAGNOSIS — R918 Other nonspecific abnormal finding of lung field: Secondary | ICD-10-CM | POA: Insufficient documentation

## 2016-01-21 DIAGNOSIS — R079 Chest pain, unspecified: Secondary | ICD-10-CM | POA: Insufficient documentation

## 2016-01-21 MED ORDER — NITROGLYCERIN 0.4 MG SL SUBL
0.4000 mg | SUBLINGUAL_TABLET | Freq: Once | SUBLINGUAL | Status: AC
  Administered 2016-01-21: 0.4 mg via SUBLINGUAL
  Filled 2016-01-21: qty 25

## 2016-01-21 MED ORDER — METOPROLOL TARTRATE 5 MG/5ML IV SOLN
5.0000 mg | INTRAVENOUS | Status: DC | PRN
  Administered 2016-01-21 (×2): 5 mg via INTRAVENOUS
  Filled 2016-01-21: qty 5

## 2016-01-21 MED ORDER — NITROGLYCERIN 0.4 MG SL SUBL
SUBLINGUAL_TABLET | SUBLINGUAL | Status: AC
  Filled 2016-01-21: qty 1

## 2016-01-21 MED ORDER — METOPROLOL TARTRATE 5 MG/5ML IV SOLN
INTRAVENOUS | Status: AC
  Filled 2016-01-21: qty 15

## 2016-01-21 MED ORDER — IOPAMIDOL (ISOVUE-370) INJECTION 76%
INTRAVENOUS | Status: AC
  Filled 2016-01-21: qty 100

## 2016-01-21 NOTE — Progress Notes (Signed)
CT scan completed. Tolerated well. D/C home alone, walking. Awake and alert. In no distress.

## 2016-01-22 ENCOUNTER — Telehealth: Payer: Self-pay | Admitting: Cardiology

## 2016-01-22 NOTE — Telephone Encounter (Signed)
See coronary CT results.

## 2016-01-22 NOTE — Telephone Encounter (Signed)
New Message  Pt requested to speak w/ RN about CT results. Please call back and discuss.

## 2016-01-27 ENCOUNTER — Ambulatory Visit (INDEPENDENT_AMBULATORY_CARE_PROVIDER_SITE_OTHER): Payer: Medicare HMO | Admitting: Physician Assistant

## 2016-01-27 ENCOUNTER — Encounter: Payer: Self-pay | Admitting: Physician Assistant

## 2016-01-27 ENCOUNTER — Encounter: Payer: Self-pay | Admitting: *Deleted

## 2016-01-27 VITALS — BP 128/82 | HR 66 | Ht 68.0 in | Wt 187.4 lb

## 2016-01-27 DIAGNOSIS — R938 Abnormal findings on diagnostic imaging of other specified body structures: Secondary | ICD-10-CM | POA: Diagnosis not present

## 2016-01-27 DIAGNOSIS — E785 Hyperlipidemia, unspecified: Secondary | ICD-10-CM

## 2016-01-27 DIAGNOSIS — R079 Chest pain, unspecified: Secondary | ICD-10-CM | POA: Diagnosis not present

## 2016-01-27 DIAGNOSIS — I10 Essential (primary) hypertension: Secondary | ICD-10-CM

## 2016-01-27 DIAGNOSIS — I493 Ventricular premature depolarization: Secondary | ICD-10-CM | POA: Diagnosis not present

## 2016-01-27 DIAGNOSIS — R931 Abnormal findings on diagnostic imaging of heart and coronary circulation: Secondary | ICD-10-CM

## 2016-01-27 LAB — CBC WITH DIFFERENTIAL/PLATELET
BASOS ABS: 70 {cells}/uL (ref 0–200)
Basophils Relative: 1 %
EOS PCT: 3 %
Eosinophils Absolute: 210 cells/uL (ref 15–500)
HCT: 48.4 % (ref 38.5–50.0)
Hemoglobin: 16.6 g/dL (ref 13.2–17.1)
Lymphocytes Relative: 22 %
Lymphs Abs: 1540 cells/uL (ref 850–3900)
MCH: 31.2 pg (ref 27.0–33.0)
MCHC: 34.3 g/dL (ref 32.0–36.0)
MCV: 91 fL (ref 80.0–100.0)
MONOS PCT: 13 %
MPV: 10.4 fL (ref 7.5–12.5)
Monocytes Absolute: 910 cells/uL (ref 200–950)
NEUTROS ABS: 4270 {cells}/uL (ref 1500–7800)
Neutrophils Relative %: 61 %
PLATELETS: 168 10*3/uL (ref 140–400)
RBC: 5.32 MIL/uL (ref 4.20–5.80)
RDW: 13.7 % (ref 11.0–15.0)
WBC: 7 10*3/uL (ref 3.8–10.8)

## 2016-01-27 LAB — COMPREHENSIVE METABOLIC PANEL
ALBUMIN: 4.4 g/dL (ref 3.6–5.1)
ALT: 17 U/L (ref 9–46)
AST: 17 U/L (ref 10–35)
Alkaline Phosphatase: 54 U/L (ref 40–115)
BUN: 22 mg/dL (ref 7–25)
CALCIUM: 9.7 mg/dL (ref 8.6–10.3)
CHLORIDE: 105 mmol/L (ref 98–110)
CO2: 25 mmol/L (ref 20–31)
Creat: 1.29 mg/dL — ABNORMAL HIGH (ref 0.70–1.25)
GLUCOSE: 85 mg/dL (ref 65–99)
POTASSIUM: 4.2 mmol/L (ref 3.5–5.3)
Sodium: 141 mmol/L (ref 135–146)
Total Bilirubin: 1.6 mg/dL — ABNORMAL HIGH (ref 0.2–1.2)
Total Protein: 7.2 g/dL (ref 6.1–8.1)

## 2016-01-27 LAB — TSH: TSH: 1.45 m[IU]/L (ref 0.40–4.50)

## 2016-01-27 LAB — PROTIME-INR
INR: 0.96 (ref <1.50)
PROTHROMBIN TIME: 12.9 s (ref 11.6–15.2)

## 2016-01-27 LAB — MAGNESIUM: Magnesium: 2.2 mg/dL (ref 1.5–2.5)

## 2016-01-27 MED ORDER — NITROGLYCERIN 0.4 MG SL SUBL: 0.4000 mg | SUBLINGUAL_TABLET | SUBLINGUAL | Status: DC | PRN

## 2016-01-27 MED ORDER — METOPROLOL SUCCINATE ER 25 MG PO TB24
25.0000 mg | ORAL_TABLET | Freq: Every day | ORAL | Status: DC
Start: 2016-01-27 — End: 2016-05-23

## 2016-01-27 MED ORDER — ATORVASTATIN CALCIUM 20 MG PO TABS: 20.0000 mg | ORAL_TABLET | Freq: Every day | ORAL | Status: DC

## 2016-01-27 MED ORDER — METOPROLOL SUCCINATE ER 25 MG PO TB24: 25.0000 mg | ORAL_TABLET | Freq: Every day | ORAL | Status: DC

## 2016-01-27 NOTE — Progress Notes (Signed)
Cardiology Office Note    Date:  01/27/2016  ID:  ABEM STPAUL, DOB April 11, 1951, MRN [id number] PCP:  Sherrie Mustache, MD  Cardiologist:  [name]   Chief Complaint: CP/SOB - discuss abnormal cardiac CT  History of Present Illness:  Duane Reyes is a 65 y.o. male with history of HTN, dyslipidemia, and GERD who presents to discuss abnormal cardiac CT. He was admitted 04/2015 with chest pain and dyspnea and had negative stress test and unremarkable echocardiogram (EF 55-60%). Pain was felt GI and PPI was increased.   Since that hospitalization, he's felt the same and saw [name] more recently 01/12/16. EKG showed NSR with trigeminal PVCs. She ordered a cardiac CT and a holter to assess PVC burden. Holter showed SB, NSR, ST, HR 54-108, with frequent PVCs in singles, couplets, and bigemy, with elevated PVC load 24%. Cardiac CT 01/21/16 showed 91st percentile calcium score, difficult study due to extensive calcified plaque, possible 50% prox LAD, poss moderate PDA stenosis, mild stenosis prox LCx, other territories dificult to interpret; overread showed several small pulm nodules in the right lung base unchanged compared to prior in 2013 considered benign. Last labs available: BMET 01/20/16 was normal. In 2016, Mg and TSH were normal, LDL 74. [name] has recommended aggressive risk factor modification and asked him to come in today to discuss [facility]. He continues to have dyspnea, primarily with exertion. This was present in 2016 but he feels like it's gradually progressed. He also notices occasional chest tightness which occurs at random when lying down but also occasionally with exertion. It lasts a few minutes at a time. It resolves spontaneously. He's not really aware of anything in particular that helps. He is not really aware of his frequent PVCs. He is not having active chest pain at present time.   Past Medical History  Diagnosis Date  . Prostate cancer ([place])   . Hypertension   .  Hyperlipidemia   . Headache(784.0)   . Arthritis     left knee  . GERD (gastroesophageal reflux disease)   . Hypothyroidism   . History of shingles   . Frequent PVCs     a. 12/2015 - Holter showed SB, NSR, ST, HR 54-108, with frequent PVCs in singles, couplets, and bigemy, with elevated PVC load 24%.    Past Surgical History  Procedure Laterality Date  . Knee surgery Left 2012  . Tonsillectomy      as a child  . Cyst removed from right arm   2005  . [place] therapy  2013  . Colonoscopy    . Esophagogastroduodenoscopy    . Back surgery      Current Medications: Outpatient Prescriptions Prior to Visit  Medication Sig Dispense Refill  . amLODipine (NORVASC) 5 MG tablet Take 5 mg by mouth daily.    [name] aspirin 81 MG chewable tablet Chew 81 mg by mouth daily.    [name] b complex vitamins tablet Take 1 tablet by mouth daily.    [name] co-enzyme Q-10 30 MG capsule Take 30 mg by mouth 3 (three) times daily.    . DUREZOL 0.05 % EMUL Place 1 drop into the left eye daily.  1  . nepafenac (ILEVRO) 0.3 % ophthalmic suspension Place 1 drop into the left eye daily.    [name] omega-3 acid ethyl esters (LOVAZA) 1 G capsule Take 3 g by mouth daily.    [name] omeprazole (PRILOSEC) 20 MG capsule Take 1 capsule (20 mg total) by mouth 2 (two)  times daily before a meal. 60 capsule 1  . pravastatin (PRAVACHOL) 40 MG tablet Take 40 mg by mouth daily.    . sildenafil (VIAGRA) 100 MG tablet Take 100 mg by mouth as directed. Reported on 01/21/2016    . levothyroxine (SYNTHROID, LEVOTHROID) 125 MCG tablet Take 112 mcg by mouth daily.    . hydroxypropyl methylcellulose (ISOPTO TEARS) 2.5 % ophthalmic solution Place 1 drop into both eyes as needed for dry eyes (dry eyes).     [name] tiZANidine (ZANAFLEX) 4 MG capsule Take 4 mg by mouth 3 (three) times daily as needed for muscle spasms. Reported on 01/21/2016     No facility-administered medications prior to visit.     Allergies:   Celebrex; Topamax; Pitavastatin; and Vimovo    Social History   Social History  . Marital Status: Married    Spouse Name: N/A  . Number of Children: N/A  . Years of Education: N/A   Social History Main Topics  . Smoking status: Never Smoker   . Smokeless tobacco: None  . Alcohol Use: No  . Drug Use: No  . Sexual Activity: Yes   Other Topics Concern  . None   Social History Narrative     Family History:  The patient's family history includes Heart attack in his father; Heart disease in his father.   ROS:   Please see the history of present illness. All other systems are reviewed and otherwise negative.    PHYSICAL EXAM:   VS:  BP 128/82 mmHg  Pulse 66  Ht 5\' 8"  (1.727 m)  Wt 187 lb 6.4 oz (85.004 kg)  BMI 28.50 kg/m2  SpO2 96%  BMI: Body mass index is 28.5 kg/(m^2). GEN: Well nourished, well developed WM, in no acute distress HEENT: normocephalic, atraumatic Neck: no JVD, carotid bruits, or masses Cardiac: RRR; no murmurs, rubs, or gallops, no edema  Respiratory:  clear to auscultation bilaterally, normal work of breathing GI: soft, nontender, nondistended, + BS MS: no deformity or atrophy Skin: warm and dry, no rash Neuro:  Alert and Oriented x 3, Strength and sensation are intact, follows commands Psych: euthymic mood, full affect  Wt Readings from Last 3 Encounters:  01/27/16 187 lb 6.4 oz (85.004 kg)  01/12/16 188 lb 1.9 oz (85.331 kg)  04/30/15 184 lb 4.9 oz (83.6 kg)      Studies/Labs Reviewed:   EKG:  EKG was ordered today and personally reviewed by me. The EKG ordered today demonstrates NSR 66bpm, IRBBB, TWI avL, possible prior inferior infarct, otherwise no acute changes.  Recent Labs: 04/29/2015: B Natriuretic Peptide 30.0; Magnesium 2.0; TSH 0.510 04/30/2015: Hemoglobin 15.2; Platelets 158 01/20/2016: BUN 12; Creat 1.12; Potassium 4.2; Sodium 140   Lipid Panel    Component Value Date/Time   CHOL 166 04/30/2015 0437   TRIG 249* 04/30/2015 0437   HDL 42 04/30/2015 0437   CHOLHDL 4.0  04/30/2015 0437   VLDL 50* 04/30/2015 0437   LDLCALC 74 04/30/2015 0437    Additional studies/ records that were reviewed today include: Summarized above.    ASSESSMENT & PLAN:   1. Chest pain/SOB with recent abnormal cardiac CT - per Dr. Theodosia Blender recommendation, proceed with [facility]. Risks and benefits of cardiac catheterization have been discussed with the patient.  These include bleeding, infection, kidney damage, stroke, heart attack, death. The patient understands these risks and is willing to proceed. Continue ASA. Add beta blocker. He is not keen on taking multiple medications but was willing to start  the BB in lieu of stopping amlodipine. Titrate statin as below. Check pre-cath labs. 2. Frequent PVCs - will check Mg, TSH today. Recent potassium was normal. Initiate Toprol XL 25mg  daily. If no obstructive disease is present, this may be a culprit for his symptoms. (Baseline HR 60s today). Will start with beta blockade and consider EP input if this does not help. 3. Essential HTN - follow BP with med change as above. As above, he is not keen on taking multiple medications but was willing to substitute the beta blocker for amlodipine. We can titrate this as needed/tolerated. 4. Dyslipidemia - given appearance of CAD on CT, will change him from pravastatin to atorvastatin. He reports h/o myalgias while on rosuvastatin, thus will start Lipitor at lower dose of 20mg  daily. Check baseline LFTs today with pre-cath labs and fasting lipid panel when he returns for f/u appt.  Disposition: F/u with APP or [name] 2 weeks after cath.   Medication Adjustments/Labs and Tests Ordered: Current medicines are reviewed at length with the patient today.  Concerns regarding medicines are outlined above. Medication changes, Labs and Tests ordered today are summarized above and listed in the Patient Instructions accessible in Encounters.   Raechel Ache PA-C  01/27/2016 4:06 PM    [facility] [place], [place], [place]  [address] Phone: [phone]; Fax: [phone]

## 2016-01-27 NOTE — Patient Instructions (Addendum)
Medication Instructions:  Your physician has recommended you make the following change in your medication:  1.  STOP the Amlodipine 2.  STOP the Pravastatin 3.  START the Nitroglycerin 0.4 s/l tablet (use as directed on the bottle) 4.  START the Lipitor 20 mg taking 1 tablet daily 5.  START the Toprol XL 25 mg taking 1 tablet daily   Labwork: TODAY:  CMET , CBC W/DIFF, MAGNESIUM, TSH, & PT/INR AT YOUR FOLLOW UP APPOINTMENT FROM YOUR HEART CATH:  FASTING LIPID PANEL  Testing/Procedures: Your physician has requested that you have a cardiac catheterization. Cardiac catheterization is used to diagnose and/or treat various heart conditions. Doctors may recommend this procedure for a number of different reasons. The most common reason is to evaluate chest pain. Chest pain can be a symptom of coronary artery disease (CAD), and cardiac catheterization can show whether plaque is narrowing or blocking your heart's arteries. This procedure is also used to evaluate the valves, as well as measure the blood flow and oxygen levels in different parts of your heart. For further information please visit [email or web address]. Please follow instruction sheet, as given.   Follow-Up: Your physician recommends that you schedule a follow-up appointment in:  2 [place], PA-C (IF POSSIBLE)       Any Other Special Instructions Will Be Listed Below (If Applicable). Coronary Angiogram A coronary angiogram, also called coronary angiography, is an X-ray procedure used to look at the arteries in the heart. In this procedure, a dye (contrast dye) is injected through a long, hollow tube (catheter). The catheter is about the size of a piece of cooked spaghetti and is inserted through your groin, wrist, or arm. The dye is injected into each artery, and X-rays are then taken to show if there is a blockage in the arteries of your heart. LET [facility] CARE PROVIDER KNOW ABOUT:  Any allergies you have, including  allergies to shellfish or contrast dye.   All medicines you are taking, including vitamins, herbs, eye drops, creams, and over-the-counter medicines.   Previous problems you or members of your family have had with the use of anesthetics.   Any blood disorders you have.   Previous surgeries you have had.  History of kidney problems or failure.   Other medical conditions you have. RISKS AND COMPLICATIONS  Generally, a coronary angiogram is a safe procedure. However, problems can occur and include:  Allergic reaction to the dye.  Bleeding from the access site or other locations.  Kidney injury, especially in people with impaired kidney function.  Stroke (rare).  Heart attack (rare). BEFORE THE PROCEDURE   Do not eat or drink anything after midnight the night before the procedure or as directed by your health care provider.   Ask your health care provider about changing or stopping your regular medicines. This is especially important if you are taking diabetes medicines or blood thinners. PROCEDURE  You may be given a medicine to help you relax (sedative) before the procedure. This medicine is given through an intravenous (IV) access tube that is inserted into one of your veins.   The area where the catheter will be inserted will be washed and shaved. This is usually done in the groin but may be done in the fold of your arm (near your elbow) or in the wrist.   A medicine will be given to numb the area where the catheter will be inserted (local anesthetic).   The health care provider will insert the  catheter into an artery. The catheter will be guided by using a special type of X-ray (fluoroscopy) of the blood vessel being examined.   A special dye will then be injected into the catheter, and X-rays will be taken. The dye will help to show where any narrowing or blockages are located in the heart arteries.  AFTER THE PROCEDURE   If the procedure is done through the  leg, you will be kept in bed lying flat for several hours. You will be instructed to not bend or cross your legs.  The insertion site will be checked frequently.   The pulse in your feet or wrist will be checked frequently.   Additional blood tests, X-rays, and an electrocardiogram may be done.    This information is not intended to replace advice given to you by your health care provider. Make sure you discuss any questions you have with your health care provider.   Document Released: 02/19/2003 Document Revised: 09/05/2014 Document Reviewed: 01/07/2013 Elsevier Interactive Patient Education [employer].     If you need a refill on your cardiac medications before your next appointment, please call your pharmacy.

## 2016-01-28 ENCOUNTER — Other Ambulatory Visit: Payer: Self-pay | Admitting: Physician Assistant

## 2016-01-29 ENCOUNTER — Ambulatory Visit (HOSPITAL_COMMUNITY)
Admission: RE | Admit: 2016-01-29 | Discharge: 2016-01-30 | Disposition: A | Discharge status: Home / Self Care | Payer: Medicare HMO | Source: Ambulatory Visit | Attending: Interventional Cardiology | Admitting: Interventional Cardiology

## 2016-01-29 ENCOUNTER — Encounter (HOSPITAL_COMMUNITY): Payer: Self-pay | Admitting: General Practice

## 2016-01-29 ENCOUNTER — Encounter (HOSPITAL_COMMUNITY)
Admission: RE | Disposition: A | Discharge status: Home / Self Care | Payer: Self-pay | Source: Ambulatory Visit | Attending: Interventional Cardiology

## 2016-01-29 DIAGNOSIS — R0609 Other forms of dyspnea: Secondary | ICD-10-CM | POA: Diagnosis present

## 2016-01-29 DIAGNOSIS — Z8249 Family history of ischemic heart disease and other diseases of the circulatory system: Secondary | ICD-10-CM | POA: Diagnosis not present

## 2016-01-29 DIAGNOSIS — Z8546 Personal history of malignant neoplasm of prostate: Secondary | ICD-10-CM | POA: Diagnosis not present

## 2016-01-29 DIAGNOSIS — K219 Gastro-esophageal reflux disease without esophagitis: Secondary | ICD-10-CM | POA: Insufficient documentation

## 2016-01-29 DIAGNOSIS — Z79899 Other long term (current) drug therapy: Secondary | ICD-10-CM | POA: Diagnosis not present

## 2016-01-29 DIAGNOSIS — I2510 Atherosclerotic heart disease of native coronary artery without angina pectoris: Secondary | ICD-10-CM | POA: Diagnosis not present

## 2016-01-29 DIAGNOSIS — I10 Essential (primary) hypertension: Secondary | ICD-10-CM | POA: Diagnosis not present

## 2016-01-29 DIAGNOSIS — Z7982 Long term (current) use of aspirin: Secondary | ICD-10-CM | POA: Insufficient documentation

## 2016-01-29 DIAGNOSIS — Q2546 Tortuous aortic arch: Secondary | ICD-10-CM | POA: Insufficient documentation

## 2016-01-29 DIAGNOSIS — E785 Hyperlipidemia, unspecified: Secondary | ICD-10-CM | POA: Diagnosis not present

## 2016-01-29 DIAGNOSIS — I2584 Coronary atherosclerosis due to calcified coronary lesion: Secondary | ICD-10-CM | POA: Insufficient documentation

## 2016-01-29 DIAGNOSIS — Z95820 Peripheral vascular angioplasty status with implants and grafts: Secondary | ICD-10-CM

## 2016-01-29 DIAGNOSIS — E039 Hypothyroidism, unspecified: Secondary | ICD-10-CM | POA: Insufficient documentation

## 2016-01-29 HISTORY — DX: Tortuous aortic arch: Q25.46

## 2016-01-29 HISTORY — DX: Atherosclerotic heart disease of native coronary artery without angina pectoris: I25.10

## 2016-01-29 HISTORY — DX: Low back pain: M54.5

## 2016-01-29 HISTORY — DX: Low back pain, unspecified: M54.50

## 2016-01-29 HISTORY — PX: CORONARY ANGIOPLASTY WITH STENT PLACEMENT: SHX49

## 2016-01-29 HISTORY — PX: CARDIAC CATHETERIZATION: SHX172

## 2016-01-29 HISTORY — DX: Other chronic pain: G89.29

## 2016-01-29 LAB — POCT I-STAT 3, VENOUS BLOOD GAS (G3P V)
ACID-BASE DEFICIT: 3 mmol/L — AB (ref 0.0–2.0)
BICARBONATE: 22.4 meq/L (ref 20.0–24.0)
O2 SAT: 67 %
PH VEN: 7.335 — AB (ref 7.250–7.300)
TCO2: 24 mmol/L (ref 0–100)
pCO2, Ven: 42.1 mmHg — ABNORMAL LOW (ref 45.0–50.0)
pO2, Ven: 37 mmHg (ref 31.0–45.0)

## 2016-01-29 LAB — CREATININE, SERUM
CREATININE: 0.98 mg/dL (ref 0.61–1.24)
GFR calc Af Amer: >60 mL/min (ref >60)

## 2016-01-29 LAB — POCT I-STAT 3, ART BLOOD GAS (G3+)
ACID-BASE DEFICIT: 5 mmol/L — AB (ref 0.0–2.0)
Bicarbonate: 20.2 mEq/L (ref 20.0–24.0)
O2 SAT: 97 %
PO2 ART: 96 mmHg (ref 80.0–100.0)
TCO2: 21 mmol/L (ref 0–100)
pCO2 arterial: 36.2 mmHg (ref 35.0–45.0)
pH, Arterial: 7.354 (ref 7.350–7.450)

## 2016-01-29 LAB — CBC
HCT: 43 % (ref 39.0–52.0)
Hemoglobin: 14.4 g/dL (ref 13.0–17.0)
MCH: 30.2 pg (ref 26.0–34.0)
MCHC: 33.5 g/dL (ref 30.0–36.0)
MCV: 90.1 fL (ref 78.0–100.0)
Platelets: 144 10*3/uL — ABNORMAL LOW (ref 150–400)
RBC: 4.77 MIL/uL (ref 4.22–5.81)
RDW: 12.9 % (ref 11.5–15.5)
WBC: 6 10*3/uL (ref 4.0–10.5)

## 2016-01-29 LAB — POCT ACTIVATED CLOTTING TIME: ACTIVATED CLOTTING TIME: 461 s

## 2016-01-29 SURGERY — RIGHT/LEFT HEART CATH AND CORONARY ANGIOGRAPHY
Anesthesia: LOCAL

## 2016-01-29 MED ORDER — SODIUM CHLORIDE 0.9% FLUSH: 3.0000 mL | INTRAVENOUS | Status: DC | PRN

## 2016-01-29 MED ORDER — ASPIRIN 81 MG PO CHEW
81.0000 mg | CHEWABLE_TABLET | Freq: Every day | ORAL | Status: DC
  Administered 2016-01-30: 08:00:00 81 mg via ORAL
  Filled 2016-01-29: qty 1

## 2016-01-29 MED ORDER — BIVALIRUDIN 250 MG IV SOLR
INTRAVENOUS | Status: AC
  Filled 2016-01-29: qty 250

## 2016-01-29 MED ORDER — HEPARIN SODIUM (PORCINE) 1000 UNIT/ML IJ SOLN
INTRAMUSCULAR | Status: AC
  Filled 2016-01-29: qty 1

## 2016-01-29 MED ORDER — ANGIOPLASTY BOOK
Freq: Once | Status: AC
  Administered 2016-01-29: 21:00:00
  Filled 2016-01-29: qty 1

## 2016-01-29 MED ORDER — HEPARIN (PORCINE) IN NACL 2-0.9 UNIT/ML-% IJ SOLN
INTRAMUSCULAR | Status: AC
  Filled 2016-01-29: qty 1000

## 2016-01-29 MED ORDER — SODIUM CHLORIDE 0.9 % WEIGHT BASED INFUSION: 1.0000 mL/kg/h | INTRAVENOUS | Status: DC

## 2016-01-29 MED ORDER — SODIUM CHLORIDE 0.9% FLUSH: 3.0000 mL | Freq: Two times a day (BID) | INTRAVENOUS | Status: DC

## 2016-01-29 MED ORDER — PANTOPRAZOLE SODIUM 40 MG PO TBEC
40.0000 mg | DELAYED_RELEASE_TABLET | Freq: Every day | ORAL | Status: DC
  Administered 2016-01-30: 08:00:00 40 mg via ORAL
  Filled 2016-01-29: qty 1

## 2016-01-29 MED ORDER — METOPROLOL SUCCINATE ER 25 MG PO TB24
25.0000 mg | ORAL_TABLET | Freq: Every day | ORAL | Status: DC
  Administered 2016-01-30: 08:00:00 25 mg via ORAL
  Filled 2016-01-29: qty 1

## 2016-01-29 MED ORDER — BIVALIRUDIN BOLUS VIA INFUSION - CUPID
INTRAVENOUS | Status: DC | PRN
  Administered 2016-01-29: 63.6 mg via INTRAVENOUS

## 2016-01-29 MED ORDER — HEPARIN SODIUM (PORCINE) 5000 UNIT/ML IJ SOLN
5000.0000 [IU] | Freq: Three times a day (TID) | INTRAMUSCULAR | Status: DC
  Administered 2016-01-30: 5000 [IU] via SUBCUTANEOUS
  Filled 2016-01-29: qty 1

## 2016-01-29 MED ORDER — FENTANYL CITRATE (PF) 100 MCG/2ML IJ SOLN
INTRAMUSCULAR | Status: DC | PRN
  Administered 2016-01-29 (×2): 25 ug via INTRAVENOUS

## 2016-01-29 MED ORDER — HEPARIN (PORCINE) IN NACL 2-0.9 UNIT/ML-% IJ SOLN
INTRAMUSCULAR | Status: DC | PRN
  Administered 2016-01-29: 1000 mL

## 2016-01-29 MED ORDER — ASPIRIN 81 MG PO CHEW: 81.0000 mg | CHEWABLE_TABLET | ORAL | Status: DC

## 2016-01-29 MED ORDER — HEPARIN SODIUM (PORCINE) 1000 UNIT/ML IJ SOLN
INTRAMUSCULAR | Status: DC | PRN
  Administered 2016-01-29: 4500 [IU] via INTRAVENOUS

## 2016-01-29 MED ORDER — ALUM & MAG HYDROXIDE-SIMETH 200-200-20 MG/5ML PO SUSP: 30.0000 mL | ORAL | Status: DC | PRN

## 2016-01-29 MED ORDER — LIDOCAINE HCL (PF) 1 % IJ SOLN
INTRAMUSCULAR | Status: AC
  Filled 2016-01-29: qty 30

## 2016-01-29 MED ORDER — MIDAZOLAM HCL 2 MG/2ML IJ SOLN
INTRAMUSCULAR | Status: AC
  Filled 2016-01-29: qty 2

## 2016-01-29 MED ORDER — FENTANYL CITRATE (PF) 100 MCG/2ML IJ SOLN
INTRAMUSCULAR | Status: AC
  Filled 2016-01-29: qty 2

## 2016-01-29 MED ORDER — IOPAMIDOL (ISOVUE-370) INJECTION 76%
INTRAVENOUS | Status: DC | PRN
  Administered 2016-01-29: 160 mL via INTRA_ARTERIAL

## 2016-01-29 MED ORDER — SODIUM CHLORIDE 0.9 % IV SOLN: 250.0000 mL | INTRAVENOUS | Status: DC | PRN

## 2016-01-29 MED ORDER — IOPAMIDOL (ISOVUE-370) INJECTION 76%
INTRAVENOUS | Status: AC
  Filled 2016-01-29: qty 100

## 2016-01-29 MED ORDER — LEVOTHYROXINE SODIUM 112 MCG PO TABS
112.0000 ug | ORAL_TABLET | Freq: Every day | ORAL | Status: DC
  Administered 2016-01-30: 04:00:00 112 ug via ORAL
  Filled 2016-01-29: qty 1

## 2016-01-29 MED ORDER — ACETAMINOPHEN 325 MG PO TABS: 650.0000 mg | ORAL_TABLET | ORAL | Status: DC | PRN

## 2016-01-29 MED ORDER — CLOPIDOGREL BISULFATE 75 MG PO TABS
75.0000 mg | ORAL_TABLET | Freq: Every day | ORAL | Status: DC
  Administered 2016-01-30: 08:00:00 75 mg via ORAL
  Filled 2016-01-29: qty 1

## 2016-01-29 MED ORDER — LIDOCAINE HCL (PF) 1 % IJ SOLN
INTRAMUSCULAR | Status: DC | PRN
  Administered 2016-01-29: 5 mL
  Administered 2016-01-29: 20 mL
  Administered 2016-01-29: 5 mL

## 2016-01-29 MED ORDER — SODIUM CHLORIDE 0.9 % IV SOLN: INTRAVENOUS | Status: AC

## 2016-01-29 MED ORDER — SODIUM CHLORIDE 0.9 % IV SOLN
250.0000 mg | INTRAVENOUS | Status: DC | PRN
  Administered 2016-01-29 (×2): 1.75 mg/kg/h via INTRAVENOUS

## 2016-01-29 MED ORDER — VERAPAMIL HCL 2.5 MG/ML IV SOLN
INTRAVENOUS | Status: DC | PRN
  Administered 2016-01-29: 10 mL via INTRA_ARTERIAL

## 2016-01-29 MED ORDER — ONDANSETRON HCL 4 MG/2ML IJ SOLN: 4.0000 mg | Freq: Four times a day (QID) | INTRAMUSCULAR | Status: DC | PRN

## 2016-01-29 MED ORDER — VERAPAMIL HCL 2.5 MG/ML IV SOLN
INTRAVENOUS | Status: AC
  Filled 2016-01-29: qty 2

## 2016-01-29 MED ORDER — MIDAZOLAM HCL 2 MG/2ML IJ SOLN
INTRAMUSCULAR | Status: DC | PRN
Start: 2016-01-29 — End: 2016-01-29
  Administered 2016-01-29 (×2): 1 mg via INTRAVENOUS

## 2016-01-29 MED ORDER — OMEGA-3-ACID ETHYL ESTERS 1 G PO CAPS
3.0000 g | ORAL_CAPSULE | Freq: Every day | ORAL | Status: DC
  Administered 2016-01-30: 08:00:00 3 g via ORAL
  Filled 2016-01-29: qty 3

## 2016-01-29 MED ORDER — NEPAFENAC 0.3 % OP SUSP: 1.0000 [drp] | Freq: Every day | OPHTHALMIC | Status: DC

## 2016-01-29 MED ORDER — SODIUM CHLORIDE 0.9 % IV SOLN
1.7500 mg/kg/h | INTRAVENOUS | Status: AC
  Filled 2016-01-29: qty 250

## 2016-01-29 MED ORDER — DIFLUPREDNATE 0.05 % OP EMUL: 1.0000 [drp] | Freq: Every day | OPHTHALMIC | Status: DC

## 2016-01-29 MED ORDER — CLOPIDOGREL BISULFATE 300 MG PO TABS
ORAL_TABLET | ORAL | Status: DC | PRN
  Administered 2016-01-29: 600 mg via ORAL

## 2016-01-29 MED ORDER — SODIUM CHLORIDE 0.9 % WEIGHT BASED INFUSION
3.0000 mL/kg/h | INTRAVENOUS | Status: DC
  Administered 2016-01-29: 3 mL/kg/h via INTRAVENOUS

## 2016-01-29 MED ORDER — NITROGLYCERIN 0.4 MG SL SUBL: 0.4000 mg | SUBLINGUAL_TABLET | SUBLINGUAL | Status: DC | PRN

## 2016-01-29 MED ORDER — CLOPIDOGREL BISULFATE 300 MG PO TABS
ORAL_TABLET | ORAL | Status: AC
  Filled 2016-01-29: qty 2

## 2016-01-29 MED ORDER — NITROGLYCERIN 1 MG/10 ML FOR IR/CATH LAB
INTRA_ARTERIAL | Status: DC | PRN
  Administered 2016-01-29: 200 ug via INTRACORONARY

## 2016-01-29 MED ORDER — ASPIRIN 81 MG PO CHEW: 81.0000 mg | CHEWABLE_TABLET | Freq: Every day | ORAL | Status: DC

## 2016-01-29 MED ORDER — ATORVASTATIN CALCIUM 20 MG PO TABS
20.0000 mg | ORAL_TABLET | Freq: Every day | ORAL | Status: DC
  Administered 2016-01-29: 20 mg via ORAL
  Filled 2016-01-29: qty 1

## 2016-01-29 SURGICAL SUPPLY — 23 items
BALLN EUPHORA RX 2.0X15 (BALLOONS) ×2
BALLOON EUPHORA RX 2.0X15 (BALLOONS) IMPLANT
CATH BALLN WEDGE 5F 110CM (CATHETERS) ×1 IMPLANT
CATH INFINITI 5 FR 3DRC (CATHETERS) ×2 IMPLANT
CATH INFINITI 5 FR JL3.5 (CATHETERS) ×1 IMPLANT
CATH INFINITI 5FR ANG PIGTAIL (CATHETERS) ×1 IMPLANT
CATH INFINITI JR4 5F (CATHETERS) ×1 IMPLANT
CATH VISTA GUIDE 6FR AL1 (CATHETERS) ×1 IMPLANT
DEVICE RAD COMP TR BAND LRG (VASCULAR PRODUCTS) ×1 IMPLANT
GLIDESHEATH SLEND SS 6F .021 (SHEATH) ×1 IMPLANT
KIT ENCORE 26 ADVANTAGE (KITS) ×1 IMPLANT
KIT HEART LEFT (KITS) ×2 IMPLANT
PACK CARDIAC CATHETERIZATION (CUSTOM PROCEDURE TRAY) ×2 IMPLANT
SHEATH FAST CATH BRACH 5F 5CM (SHEATH) ×1 IMPLANT
SHEATH PINNACLE 6F 10CM (SHEATH) ×1 IMPLANT
STENT SYNERGY DES 2.5X16 (Permanent Stent) ×1 IMPLANT
STENT SYNERGY DES 2.75X12 (Permanent Stent) ×1 IMPLANT
SYR MEDRAD MARK V 150ML (SYRINGE) ×2 IMPLANT
TRANSDUCER W/STOPCOCK (MISCELLANEOUS) ×4 IMPLANT
TUBING CIL FLEX 10 FLL-RA (TUBING) ×2 IMPLANT
VALVE GUARDIAN II ~~LOC~~ HEMO (MISCELLANEOUS) ×1 IMPLANT
WIRE ASAHI PROWATER 180CM (WIRE) ×1 IMPLANT
WIRE SAFE-T 1.5MM-J .035X260CM (WIRE) ×1 IMPLANT

## 2016-01-29 NOTE — Progress Notes (Signed)
TR BAND REMOVAL  LOCATION:    right radial  DEFLATED PER PROTOCOL:    Yes.    TIME BAND OFF / DRESSING APPLIED:    22:15   SITE UPON ARRIVAL:    Level 0  SITE AFTER BAND REMOVAL:    Level 0  CIRCULATION SENSATION AND MOVEMENT:    Within Normal Limits   Yes.    COMMENTS:  Post TR band instructions given. Pt tolerated well. 

## 2016-01-29 NOTE — Progress Notes (Signed)
Site area: right brachial  Site Prior to Removal:  Level 0  Pressure Applied For 25 MINUTES    Minutes Beginning at 20:35  Manual:   Yes.    Patient Status During Pull:  WNL  Post Pull brachial Site:  Level 0  Post Pull Instructions Given:  Yes.    Post Pull Pulses Present:  Yes.    Dressing Applied:  Yes.    Comments:  Post brachial sheath instructions given. Pt tolerated well.

## 2016-01-29 NOTE — Progress Notes (Signed)
Site area: right groin  Site Prior to Removal:  Level 0  Pressure Applied For 25 MINUTES    Minutes Beginning at 21:00  Manual:   Yes.    Patient Status During Pull:  WNL  Post Pull Groin Site:  Level 0  Post Pull Instructions Given:  Yes.    Post Pull Pulses Present:  Yes.    Dressing Applied:  Yes.    Comments:  Pt tolerated well.

## 2016-01-29 NOTE — Interval H&P Note (Signed)
Cath Lab Visit (complete for each Cath Lab visit)  Clinical Evaluation Leading to the Procedure:   ACS: No.  Non-ACS:    Anginal Classification: CCS III  Anti-ischemic medical therapy: Minimal Therapy (1 class of medications)  Non-Invasive Test Results: High-risk stress test findings: cardiac mortality >3%/year  Prior CABG: No previous CABG      History and Physical Interval Note:  01/29/2016 3:47 PM  Duane Reyes  has presented today for surgery, with the diagnosis of Chest Pain/SOB/Abnormal Cardiac CT  The various methods of treatment have been discussed with the patient and family. After consideration of risks, benefits and other options for treatment, the patient has consented to  Procedure(s): Right/Left Heart Cath and Coronary Angiography (N/A) as a surgical intervention .  The patient's history has been reviewed, patient examined, no change in status, stable for surgery.  I have reviewed the patient's chart and labs.  Questions were answered to the patient's satisfaction.     Larae Grooms

## 2016-01-29 NOTE — H&P (View-Only) (Signed)
Cardiology Office Note    Date:  01/12/2016   ID:  Duane Reyes, DOB 31-Aug-1950, MRN [id number]  PCP:  Sherrie Mustache, MD  Cardiologist:  Fransico Him, MD   Chief Complaint  Patient presents with  . Chest Pain    pt states nothing drastic   . Shortness of Breath    with any exertion    History of Present Illness:  Duane Reyes is a 65 y.o. male with a history of HTN, dyslipidemia and GERD who presented to [facility] with complaints of chest pain 03/2015.  He ruled out for MI and 2D echo showed normal LVF and nuclear stress test showed no ischemia.  It was felt his pain was from GERD and was placed on PPI.  He says since that hospitalization he has felt the same.  He says that he has had exertional fatigue and DOE.  His thyroid has been a little out of wack but was adjusted and has not made a difference.  He has had some mild twinges of CP rarely in the midsternal region.  He says that when he tries to catch his breath he will feel some pressure in his chest.      Past Medical History  Diagnosis Date  . Prostate cancer ([place])   . Hypertension     takes Amlodipine daily  . Hyperlipidemia     was on meds but hasn't been on in 89months;Dr.Nyland took him off  . Headache(784.0)   . Arthritis     left knee  . GERD (gastroesophageal reflux disease)     takes Nexium daily  . Hypothyroidism     takes Synthroid daily  . Urinary frequency   . Nocturia   . History of shingles   . PVC (premature ventricular contraction) 01/12/2016    Past Surgical History  Procedure Laterality Date  . Knee surgery Left 2012  . Tonsillectomy      as a child  . Cyst removed from right arm   2005  . [place] therapy  2013  . Colonoscopy    . Esophagogastroduodenoscopy    . Back surgery      Current Medications: Outpatient Prescriptions Prior to Visit  Medication Sig Dispense Refill  . amLODipine (NORVASC) 5 MG tablet Take 5 mg by mouth daily.    [name] aspirin 81 MG chewable tablet Chew 81 mg  by mouth daily.    [name] b complex vitamins tablet Take 1 tablet by mouth daily.    [name] co-enzyme Q-10 30 MG capsule Take 30 mg by mouth 3 (three) times daily.    . hydroxypropyl methylcellulose (ISOPTO TEARS) 2.5 % ophthalmic solution Place 1 drop into both eyes as needed for dry eyes (dry eyes).     [name] omega-3 acid ethyl esters (LOVAZA) 1 G capsule Take 3 g by mouth daily.    [name] omeprazole (PRILOSEC) 20 MG capsule Take 1 capsule (20 mg total) by mouth 2 (two) times daily before a meal. 60 capsule 1  . tiZANidine (ZANAFLEX) 4 MG capsule Take 4 mg by mouth 3 (three) times daily as needed for muscle spasms.    [name] levothyroxine (SYNTHROID, LEVOTHROID) 125 MCG tablet Take 125 mcg by mouth daily before breakfast. Reported on 01/12/2016    . pravastatin (PRAVACHOL) 20 MG tablet Take 1 tablet (20 mg total) by mouth at bedtime. (Patient not taking: Reported on 01/12/2016) 30 tablet 1   No facility-administered medications prior to visit.     Allergies:  Celebrex; Topamax; Pitavastatin; and Vimovo   Social History   Social History  . Marital Status: Married    Spouse Name: N/A  . Number of Children: N/A  . Years of Education: N/A   Social History Main Topics  . Smoking status: Never Smoker   . Smokeless tobacco: None  . Alcohol Use: No  . Drug Use: No  . Sexual Activity: Yes   Other Topics Concern  . None   Social History Narrative     Family History:  The patient's family history includes Heart attack in his father; Heart disease in his father.   ROS:   Please see the history of present illness.    ROS All other systems reviewed and are negative.   PHYSICAL EXAM:   VS:  BP 140/90 mmHg  Pulse 59  Ht 5\' 8"  (1.727 m)  Wt 188 lb 1.9 oz (85.331 kg)  BMI 28.61 kg/m2  SpO2 97%   GEN: Well nourished, well developed, in no acute distress HEENT: normal Neck: no JVD, carotid bruits, or masses Cardiac: RRR; no murmurs, rubs, or gallops,no edema.  Intact distal pulses bilaterally.    Respiratory:  clear to auscultation bilaterally, normal work of breathing GI: soft, nontender, nondistended, + BS MS: no deformity or atrophy Skin: warm and dry, no rash Neuro:  Alert and Oriented x 3, Strength and sensation are intact Psych: euthymic mood, full affect  Wt Readings from Last 3 Encounters:  01/12/16 188 lb 1.9 oz (85.331 kg)  04/30/15 184 lb 4.9 oz (83.6 kg)  09/09/13 179 lb 9 oz (81.449 kg)      Studies/Labs Reviewed:   EKG:  EKG is  ordered today and showed NSR with trigeminal PVC's  And IRBBB  Recent Labs: 04/29/2015: B Natriuretic Peptide 30.0; Magnesium 2.0; TSH 0.510 04/30/2015: BUN 15; Creatinine, Ser 1.03; Hemoglobin 15.2; Platelets 158; Potassium 3.9; Sodium 137   Lipid Panel    Component Value Date/Time   CHOL 166 04/30/2015 0437   TRIG 249* 04/30/2015 0437   HDL 42 04/30/2015 0437   CHOLHDL 4.0 04/30/2015 0437   VLDL 50* 04/30/2015 0437   LDLCALC 74 04/30/2015 0437    Additional studies/ records that were reviewed today include:  none    ASSESSMENT:    1. SOB (shortness of breath)   2. Chest pain, unspecified chest pain type   3. Essential hypertension   4. Hyperlipidemia LDL goal <100   5. PVC (premature ventricular contraction)      PLAN:  In order of problems listed above:  1.  DOE and exertional fatigue with normal nuclear stress test 04/2015 and normal 2D echo.  I discussed with him that 15% of patients can have a normal nuclear stress test and still have CAD.  I recommend that we get a coronary CTA with morphology and calcium score to evaluate further.   2.  CP that is less than at time of stress test 3.  HTN - BP borderline controlled.  Continue amlodipine 4.  Dyslipidemia - continue Lovaza/statin 5.  PVCs - trigeminal on EKG and ? Whether this is causing his SOB.  I will get a 24 hour Holter to assess PVC load.   Medication Adjustments/Labs and Tests Ordered: Current medicines are reviewed at length with the patient today.   Concerns regarding medicines are outlined above.  Medication changes, Labs and Tests ordered today are listed in the Patient Instructions below.  There are no Patient Instructions on file for this visit.  Signed, Fransico Him, MD  01/12/2016 2:03 PM    [address], [place], [place]  [address] Phone: [phone]; Fax: [phone]

## 2016-01-30 ENCOUNTER — Encounter (HOSPITAL_COMMUNITY): Payer: Self-pay | Admitting: Cardiology

## 2016-01-30 DIAGNOSIS — I2584 Coronary atherosclerosis due to calcified coronary lesion: Secondary | ICD-10-CM | POA: Diagnosis not present

## 2016-01-30 DIAGNOSIS — E785 Hyperlipidemia, unspecified: Secondary | ICD-10-CM

## 2016-01-30 DIAGNOSIS — I2510 Atherosclerotic heart disease of native coronary artery without angina pectoris: Secondary | ICD-10-CM | POA: Diagnosis not present

## 2016-01-30 DIAGNOSIS — Q2546 Tortuous aortic arch: Secondary | ICD-10-CM

## 2016-01-30 DIAGNOSIS — R0602 Shortness of breath: Secondary | ICD-10-CM | POA: Diagnosis not present

## 2016-01-30 DIAGNOSIS — Z95820 Peripheral vascular angioplasty status with implants and grafts: Secondary | ICD-10-CM

## 2016-01-30 DIAGNOSIS — I10 Essential (primary) hypertension: Secondary | ICD-10-CM

## 2016-01-30 LAB — BASIC METABOLIC PANEL
Anion gap: 6 (ref 5–15)
BUN: 14 mg/dL (ref 6–20)
CALCIUM: 8.7 mg/dL — AB (ref 8.9–10.3)
CO2: 25 mmol/L (ref 22–32)
CREATININE: 1.09 mg/dL (ref 0.61–1.24)
Chloride: 106 mmol/L (ref 101–111)
GFR calc Af Amer: >60 mL/min (ref >60)
GLUCOSE: 93 mg/dL (ref 65–99)
POTASSIUM: 3.9 mmol/L (ref 3.5–5.1)
SODIUM: 137 mmol/L (ref 135–145)

## 2016-01-30 LAB — CBC
HEMATOCRIT: 43.2 % (ref 39.0–52.0)
Hemoglobin: 14.1 g/dL (ref 13.0–17.0)
MCH: 29.7 pg (ref 26.0–34.0)
MCHC: 32.6 g/dL (ref 30.0–36.0)
MCV: 90.9 fL (ref 78.0–100.0)
Platelets: 149 10*3/uL — ABNORMAL LOW (ref 150–400)
RBC: 4.75 MIL/uL (ref 4.22–5.81)
RDW: 13.2 % (ref 11.5–15.5)
WBC: 6.1 10*3/uL (ref 4.0–10.5)

## 2016-01-30 MED ORDER — ATORVASTATIN CALCIUM 40 MG PO TABS: 40.0000 mg | ORAL_TABLET | Freq: Every day | ORAL | Status: DC

## 2016-01-30 MED ORDER — CLOPIDOGREL BISULFATE 75 MG PO TABS
75.0000 mg | ORAL_TABLET | Freq: Every day | ORAL | Status: DC
Start: 2016-01-30 — End: 2016-02-03

## 2016-01-30 MED ORDER — PANTOPRAZOLE SODIUM 40 MG PO TBEC: 40.0000 mg | DELAYED_RELEASE_TABLET | Freq: Every day | ORAL | Status: DC

## 2016-01-30 MED ORDER — ACETAMINOPHEN 325 MG PO TABS: 650.0000 mg | ORAL_TABLET | ORAL | Status: DC | PRN

## 2016-01-30 NOTE — Discharge Summary (Signed)
Physician Discharge Summary       Patient ID: RONNE MEZGER MRN: [id number] DOB/AGE: 1950/09/07 65 y.o.  Admit date: 01/29/2016 Discharge date: 01/30/2016 Primary Cardiologist:[name]   Discharge Diagnoses:  Principal Problem:   SOB (shortness of breath) Active Problems:   Coronary artery calcification seen on CT scan   S/P angioplasty with stent 01/29/16 RPDA with 2 overlapping DES     HTN (hypertension)   Hyperlipidemia LDL goal <100   DOE (dyspnea on exertion)   CAD in native artery   Tortuosity of aortic arch, -would not pursue Rt radial approach in this patient for future cardiac caths   Discharged Condition: good  Procedures: Rt and Lt Heart cath 6/21/7 by Dr. Irish Lack Procedures    Coronary Stent Intervention 2.5/16mm Synergy-proximal PDA   Right/Left Heart Cath and Coronary Angiography    Conclusion     Mid LAD lesion, 50% stenosed.  2nd Mrg lesion, 25% stenosed.  The left ventricular systolic function is normal.  RPDA lesion, 95% stenosed. Post intervention with overlapping drug eluting stents postdilated to > 3 mm, there is a 0% residual stenosis.  Due to severe tortuosity in his aortic arch, would not pursue right radial approach in this patient in the future if catheterization was needed.  Normal pulmonary artery pressures.  The patient we watched overnight. Continue dual antiplatelet therapy for at least a year. He'll need aggressive secondary prevention. Plan discharge tomorrow.    Dominance: Right   Left Anterior Descending   . Mid LAD lesion, 50% stenosed. Calcified.     Left Circumflex   . Second Obtuse Marginal Branch   The vessel is small in size.   . 2nd Mrg lesion, 25% stenosed.     Right Coronary Artery   . Right Posterior Descending Artery   . RPDA lesion, 95% stenosed.   [name] PCI: The pre-interventional distal flow is normal (TIMI 3). Pre-stent angioplasty was performed. A drug-eluting stent was placed. The strut is apposed.  Post-stent angioplasty was performed. The post-interventional distal flow is normal (TIMI 3). The intervention was successful. No complications occurred at this lesion.  . Supplies used: BALLOON EUPHORA [id number] 2.0X15; STENT SYNERGY DES 2.5X16; STENT SYNERGY DES [id number]  . There is no residual stenosis post intervention.         Right Heart Pressures LV EDP is normal.     Fick Cardiac Output  3.91 L/min    Fick Cardiac Output Index  1.96 (L/min)/BSA   RA A Wave  6 mmHg   RA V Wave  4 mmHg   RA Mean  4 mmHg   RV Systolic Pressure  25 mmHg   RV Diastolic Pressure  0 mmHg   RV EDP  4 mmHg   PA Systolic Pressure  25 mmHg   PA Diastolic Pressure  8 mmHg   PA Mean  15 mmHg   PW A Wave  11 mmHg   PW V Wave  9 mmHg   PW Mean  8 mmHg   AO Systolic Pressure  97 mmHg   AO Diastolic Pressure  53 mmHg   AO Mean  69 mmHg   LV Systolic Pressure  0000000 mmHg   LV Diastolic Pressure  1 mmHg   LV EDP  9 mmHg   Arterial Occlusion Pressure Extended Systolic Pressure  99991111 mmHg   Arterial Occlusion Pressure Extended Diastolic Pressure  64 mmHg   Arterial Occlusion Pressure Extended Mean Pressure  82 mmHg   Left Ventricular Apex Extended  Systolic Pressure  [id number] mmHg   Left Ventricular Apex Extended Diastolic Pressure  4 mmHg   Left Ventricular Apex Extended EDP Pressure  11 mmHg   QP/QS  1   TPVR Index  7.64 HRUI   TSVR Index  35.14 HRUI   PVR SVR Ratio  0.11   TPVR/TSVR Ratio  0.22        Hospital Course: 65 y.o. male with a history of HTN, dyslipidemia and GERD who presented to [facility] with complaints of chest pain 03/2015. He ruled out for MI and 2D echo showed normal LVF and nuclear stress test showed no ischemia. It was felt his pain was from GERD and was placed on PPI. He says since that hospitalization he has felt the same. He says that he has had exertional fatigue and DOE. His thyroid has been a little out of wack but was adjusted and has not made a  difference. He has had some mild twinges of CP rarely in the midsternal region. He says that when he tries to catch his breath he will feel some pressure in his chest.  He then has coronary CTA for continued symptoms.  Coronary Ca score of 1004 and coronary blockages.  At that point with continued symptoms cardiac cath was planned.  Results as above with RPDA lesion and stents overlapping DES.   He tolerated procedure well.  Today pt is stable without complaints to walk with cardiac rehab.  VS stable. He is seen by [name] and found stable for discharge.   He will continue on plavix and ASA, His BB, and statin.    Plan for outpt. Follow up lipids.    Consults: None  Significant Diagnostic Studies:  BMET    Component Value Date/Time   NA 137 01/30/2016 0350   K 3.9 01/30/2016 0350   CL 106 01/30/2016 0350   CO2 25 01/30/2016 0350   GLUCOSE 93 01/30/2016 0350   BUN 14 01/30/2016 0350   CREATININE 1.09 01/30/2016 0350   CREATININE 1.29* 01/27/2016 1652   CALCIUM 8.7* 01/30/2016 0350   GFRNONAA >60 01/30/2016 0350   GFRAA >60 01/30/2016 0350    CBC    Component Value Date/Time   WBC 6.1 01/30/2016 0350   RBC 4.75 01/30/2016 0350   HGB 14.1 01/30/2016 0350   HCT 43.2 01/30/2016 0350   PLT 149* 01/30/2016 0350   MCV 90.9 01/30/2016 0350   MCH 29.7 01/30/2016 0350   MCHC 32.6 01/30/2016 0350   RDW 13.2 01/30/2016 0350   LYMPHSABS 1540 01/27/2016 1652   MONOABS 910 01/27/2016 1652   EOSABS 210 01/27/2016 1652   BASOSABS 70 01/27/2016 1652    EKG SR no changes from pre procedure.     Discharge Exam: Blood pressure 145/67, pulse 57, temperature 97.5 F (36.4 C), temperature source Oral, resp. rate 11, height 5\' 8"  (1.727 m), weight 190 lb 11.2 oz (86.5 kg), SpO2 98 %. General:Pleasant affect, NAD Skin:Warm and dry, brisk capillary refill HEENT:normocephalic, sclera clear, mucus membranes moist Heart:S1S2 RRR without murmur, gallup, rub or click Lungs:clear without  rales, rhonchi, or wheezes [id number], non tender, + BS, do not palpate liver spleen or masses Ext:no lower ext edema, 2+ pedal pulses, 2+ radial pulses, rt radial cath site stable and Rt groin cath site stable.  Neuro:alert and oriented, MAE, follows commands, + facial symmetry Musculoskeletal: no deformities, weakness  Disposition: 01-Home or Self Care      Discharge Instructions    Amb Referral to Cardiac Rehabilitation  Complete by:  As directed   Diagnosis:  Coronary Stents            Medication List    STOP taking these medications        omeprazole 20 MG capsule  Commonly known as:  PRILOSEC  Replaced by:  pantoprazole 40 MG tablet     sildenafil 100 MG tablet  Commonly known as:  VIAGRA      TAKE these medications        acetaminophen 325 MG tablet  Commonly known as:  TYLENOL  Take 2 tablets (650 mg total) by mouth every 4 (four) hours as needed for headache or mild pain.     aspirin 81 MG chewable tablet  Chew 81 mg by mouth daily.     atorvastatin 40 MG tablet  Commonly known as:  LIPITOR  Take 1 tablet (40 mg total) by mouth daily at 6 PM.     b complex vitamins tablet  Take 1 tablet by mouth daily.     clopidogrel 75 MG tablet  Commonly known as:  PLAVIX  Take 1 tablet (75 mg total) by mouth daily with breakfast.     CO Q 10 PO  Take 120 mg elemental calcium/kg/hr by mouth daily.     DUREZOL 0.05 % Emul  Generic drug:  Difluprednate  Place 1 drop into the left eye daily.     ILEVRO 0.3 % ophthalmic suspension  Generic drug:  nepafenac  Place 1 drop into the left eye daily.     levothyroxine 112 MCG tablet  Commonly known as:  SYNTHROID, LEVOTHROID  Take 112 mcg by mouth daily before breakfast.     metoprolol succinate 25 MG 24 hr tablet  Commonly known as:  TOPROL XL  Take 1 tablet (25 mg total) by mouth daily.     nitroGLYCERIN 0.4 MG SL tablet  Commonly known as:  NITROSTAT  Place 1 tablet (0.4 mg total) under the tongue every 5  (five) minutes as needed for chest pain.     omega-3 acid ethyl esters 1 g capsule  Commonly known as:  LOVAZA  Take 3 g by mouth daily.     pantoprazole 40 MG tablet  Commonly known as:  PROTONIX  Take 1 tablet (40 mg total) by mouth daily.     SIMBRINZA 1-0.2 % Susp  Generic drug:  Brinzolamide-Brimonidine  Place 1 drop into the left eye daily.       Follow-up Information    Follow up with Fransico Him, MD.   Specialty:  Cardiology   Why:  office should call you Monday or Tuesday for an appt in 2 weeks.    Contact information:   [id number] [place] [address] [phone]        Discharge Instructions: Call [facility] at [phone] if any bleeding, swelling or drainage at cath site.  May shower, no tub baths for 48 hours for groin sticks. No lifting over 5 pounds for 3 days.  No Driving for 3 days  Take 1 NTG, under your tongue, while sitting.  If no relief of pain may repeat NTG, one tab every 5 minutes up to 3 tablets total over 15 minutes.  If no relief CALL 911.  If you have dizziness/lightheadness  while taking NTG, stop taking and call 911.         Heart Healthy Diet  Do Not stop Plavix and Asprin, stopping could cause a heart  attack.    We increased your lipitor to 40 mg.   Do not take any more Viagra due to the CAD and NTG.   Signed: Cecilie Kicks Nurse Practitioner-Certified [facility]: [facility] 01/30/2016, 9:41 AM  Time spent on discharge :  30 minutes.    Patient seen and independently examined with Cecilie Kicks NP. We discussed all aspects of the encounter. I agree with the assessment and plan as stated above.  Patient is stable for discharge with no CP on ambulation.  He has taken Viagra in the past and is now on SL NTG PRN.  I cautioned him to avoid using Viagra and side effects if taken within 48 hours of NTG.  He understands. Will have TOC followup in 1-2 weeks.  Signed: Fransico Him,  MD [facility] 01/30/2016

## 2016-01-30 NOTE — Discharge Instructions (Signed)
Call [facility] at [phone] if any bleeding, swelling or drainage at cath site.  May shower, no tub baths for 48 hours for groin sticks. No lifting over 5 pounds for 3 days.  No Driving for 3 days  Take 1 NTG, under your tongue, while sitting.  If no relief of pain may repeat NTG, one tab every 5 minutes up to 3 tablets total over 15 minutes.  If no relief CALL 911.  If you have dizziness/lightheadness  while taking NTG, stop taking and call 911.         Heart Healthy Diet  Do Not stop Plavix and Asprin, stopping could cause a heart attack.    Prilosec and Plavix my interact so we changed you to protonix.    We increased your lipitor to 40 mg.   Do not take any more Viagra due to the CAD and NTG.

## 2016-01-30 NOTE — Progress Notes (Signed)
CARDIAC REHAB PHASE I   PRE:  Rate/Rhythm: 70  BP:  Sitting: 136/81     SaO2: 97% ra  MODE:  Ambulation: 1000 ft   POST:  Rate/Rhythm: 82  BP:  Sitting: 134/87     SaO2: 89% ra  8:28am-9:15am Patient ambulated at a nice steady pace independently. Patient stated that he was short of breath and his left knee was hurting. Shortness of breath subsided when we slowed down our pace. No other complaints. Education completed. Patient interested in Cardiac Rehab at [facility].   [place], MS 01/30/2016 9:15 AM

## 2016-02-01 ENCOUNTER — Encounter (HOSPITAL_COMMUNITY): Payer: Self-pay | Admitting: Interventional Cardiology

## 2016-02-03 ENCOUNTER — Telehealth: Payer: Self-pay | Admitting: *Deleted

## 2016-02-03 MED ORDER — CLOPIDOGREL BISULFATE 75 MG PO TABS: 75.0000 mg | ORAL_TABLET | Freq: Every day | ORAL | Status: DC

## 2016-02-03 MED ORDER — PANTOPRAZOLE SODIUM 40 MG PO TBEC: 40.0000 mg | DELAYED_RELEASE_TABLET | Freq: Every day | ORAL | Status: DC

## 2016-02-03 NOTE — Telephone Encounter (Signed)
called to confirm that the pharmacy recieved the pt's plavix and protonix Rx sent 01/30/16, confirmed by Sharyn Lull that they did not recieve it, resent while on the phone and they recieved it , will call pt and inform him of this.  Called pt @ [phone] & informed him RX was sent, pt expressed understanding and thanks

## 2016-02-04 ENCOUNTER — Ambulatory Visit: Payer: Medicare HMO | Admitting: Physician Assistant

## 2016-02-12 ENCOUNTER — Encounter (INDEPENDENT_AMBULATORY_CARE_PROVIDER_SITE_OTHER): Payer: Self-pay | Admitting: Ophthalmology

## 2016-02-13 ENCOUNTER — Telehealth: Payer: Self-pay | Admitting: Physician Assistant

## 2016-02-13 NOTE — Telephone Encounter (Signed)
Pt was out of town and coughed up a small amount of blood today. He also mentions gums bleeding when he brushes his teeth.  Had 2 DES to the RPDA 01/29/2016 He is on Plavix (new) and has been on CoQ10 and 3 gm Lovaza daily. Driving now and about 3 hr from home.  No calf pain or swelling  Denies any acute illness, no cough, SOB, weakness, dizziness. No melena.  Advised him CXR and eval would be preferable, but he is reluctant (small amt blood, never happened before).  Advised him that if he got additional sx, he should seek treatment.  Advised him that he should stop the CoQ10 and the Omega 3 for now. Keep f/u appt next week.   Lenoard Aden 02/13/2016 1:43 PM Beeper [phone]

## 2016-02-18 ENCOUNTER — Encounter: Payer: Self-pay | Admitting: Physician Assistant

## 2016-02-18 NOTE — Progress Notes (Signed)
Cardiology Office Note    Date:  02/19/2016  ID:  Duane Reyes, DOB 26-Sep-1950, MRN [id number] PCP:  Sherrie Mustache, MD  Cardiologist: [name]  Chief Complaint: f/u CAD  History of Present Illness:  Duane Reyes is a 65 y.o. male with history of recently diagnosed CAD, PVCS, HTN, dyslipidemia, GERD who presents for post-hospital follow-up.  He was initially evaluated in 04/2015 with chest pain and stress test/echo were unremarkable. He returned for continued sx in 12/2015 and cardiac CT showed suggestion of significant CAD. EKG had shown PVCs so event monitor was also obtained showing SB, NSR, ST, HR 54-108, with frequent PVCs in singles, couplets, and bigemy, with elevated PVC load 24%. Mg 2.2, K 4.2. He was placed on beta blocker therapy and brought in for [facility] 01/29/16 which showed 50% mLAD, 25% OM2, 95% RPDA which was treated with overlapping DES. LVEDP was normal. Lipitor was increased to 40mg  daily. Lipids are being drawn today. Per notes, given tortuosity of aortic arch, would not pursue right radial approach in this patient for future cardiac caths. F/u labs by PCP 6/19 Hgb 16.2, TSH 3.830, Cr 1.25, TBili 1.6, LDL 72. PCP plans f/u labs in 4 months.  He comes in today for follow-up. He had noticed last weekend when brushing his teeth that his gums bled. Shortly after that he coughed and noticed dried blood come up. This was an isolated incident and has not happened since. He has not had any recurrent chest pain or tightness but says his stamina hasn't fully come back. No dyspnea. No problems with cath site. He is unaware of any palpitations.  Past Medical History  Diagnosis Date  . Hypertension   . Hyperlipidemia   . GERD (gastroesophageal reflux disease)   . Hypothyroidism   . History of shingles   . Frequent PVCs     a. 12/2015 - Holter showed SB, NSR, ST, HR 54-108, with frequent PVCs in singles, couplets, and bigemy, with elevated PVC load 24%.  . Arthritis    "left knee" (01/29/2016)  . Chronic lower back pain   . Prostate cancer ([place])   . CAD in native artery     a. Abnl cardiac CT -> LHC 01/2016 s/p 50% mLAD, 25% OM2, 95% RPDA which was treated with overlapping DES.  . Tortuosity of aortic arch, -would not pursue Rt radial approach in this patient for future cardiac caths     Past Surgical History  Procedure Laterality Date  . Knee arthroscopy Left 2012    "torn meniscus"  . Cyst excision Right 2005    "near bicep"  . Prostate surgery  2013    "brachytherapy for prostate radiation"  . Colonoscopy    . Esophagogastroduodenoscopy    . Back surgery    . Coronary angioplasty with stent placement  01/29/2016    "2 stents"  . Tonsillectomy  1950s  . Posterior lumbar fusion  02/2013    "L4-5; hardware in place"  . Cataract extraction w/ intraocular lens implant Left 11/2015  . Prostate biopsy  2013  . Cardiac catheterization N/A 01/29/2016    Procedure: Right/Left Heart Cath and Coronary Angiography;  Surgeon: Jettie Booze, MD;  Location: [place] [facility];  Service: Cardiovascular;  Laterality: N/A;  . Cardiac catheterization N/A 01/29/2016    Procedure: Coronary Stent Intervention 2.5/16mm Synergy-proximal PDA;  Surgeon: Jettie Booze, MD;  Location: [place] [facility];  Service: Cardiovascular;  Laterality: N/A;    Current Medications: Current  Outpatient Prescriptions  Medication Sig Dispense Refill  . acetaminophen (TYLENOL) 325 MG tablet Take 2 tablets (650 mg total) by mouth every 4 (four) hours as needed for headache or mild pain.    [name] aspirin 81 MG chewable tablet Chew 81 mg by mouth daily.    [name] atorvastatin (LIPITOR) 40 MG tablet Take 1 tablet (40 mg total) by mouth daily at 6 PM. 30 tablet 6  . b complex vitamins tablet Take 1 tablet by mouth daily.    . Brinzolamide-Brimonidine (SIMBRINZA) 1-0.2 % SUSP Place 1 drop into the left eye daily.    . clopidogrel (PLAVIX) 75 MG tablet Take 1 tablet (75 mg total) by mouth daily  with breakfast. 90 tablet 3  . DUREZOL 0.05 % EMUL Place 1 drop into the left eye daily.  1  . levothyroxine (SYNTHROID, LEVOTHROID) 112 MCG tablet Take 112 mcg by mouth daily before breakfast.    . metoprolol succinate (TOPROL XL) 25 MG 24 hr tablet Take 1 tablet (25 mg total) by mouth daily. 90 tablet 3  . nepafenac (ILEVRO) 0.3 % ophthalmic suspension Place 1 drop into the left eye daily.    . nitroGLYCERIN (NITROSTAT) 0.4 MG SL tablet Place 0.4 mg under the tongue every 5 (five) minutes as needed for chest pain (x 3 doses).    . pantoprazole (PROTONIX) 40 MG tablet Take 1 tablet (40 mg total) by mouth daily. 90 tablet 3   No current facility-administered medications for this visit.     Allergies:   Celebrex; Crestor; Topamax; Pitavastatin; and Vimovo   Social History   Social History  . Marital Status: Married    Spouse Name: N/A  . Number of Children: N/A  . Years of Education: N/A   Social History Main Topics  . Smoking status: Never Smoker   . Smokeless tobacco: Never Used  . Alcohol Use: Yes     Comment: 01/29/2016 "glass of wine q couple months"  . Drug Use: No  . Sexual Activity: Not Currently   Other Topics Concern  . None   Social History Narrative     Family History:  The patient's family history includes Heart attack in his father; Heart disease in his father.  ROS:   Please see the history of present illness. All other systems are reviewed and otherwise negative.    PHYSICAL EXAM:   VS:  BP 120/84 mmHg  Pulse 65  Ht 5\' 8"  (1.727 m)  Wt 186 lb 9.6 oz (84.641 kg)  BMI 28.38 kg/m2  BMI: Body mass index is 28.38 kg/(m^2). GEN: Well nourished, well developed WM, in no acute distress HEENT: normocephalic, atraumatic Neck: no JVD, carotid bruits, or masses Cardiac: RRR; no murmurs, rubs, or gallops, no edema  Respiratory:  clear to auscultation bilaterally, normal work of breathing GI: soft, nontender, nondistended, + BS MS: no deformity or atrophy Skin:  warm and dry, no rash, right groin cath site without hematoma, ecchymosis, or bruit. Right radial cath site without hematoma or ecchymosis; good pulse. Neuro:  Alert and Oriented x 3, Strength and sensation are intact, follows commands Psych: euthymic mood, full affect  Wt Readings from Last 3 Encounters:  02/19/16 186 lb 9.6 oz (84.641 kg)  01/30/16 190 lb 11.2 oz (86.5 kg)  01/27/16 187 lb 6.4 oz (85.004 kg)      Studies/Labs Reviewed:   EKG:  EKG was ordered today and personally reviewed by me and demonstrates NSR incomplete RBBB 65bpm  Recent Labs: 04/29/2015: B  Natriuretic Peptide 30.0 01/27/2016: ALT 17; Magnesium 2.2; TSH 1.45 01/30/2016: BUN 14; Creatinine, Ser 1.09; Hemoglobin 14.1; Platelets 149*; Potassium 3.9; Sodium 137   Lipid Panel    Component Value Date/Time   CHOL 166 04/30/2015 0437   TRIG 249* 04/30/2015 0437   HDL 42 04/30/2015 0437   CHOLHDL 4.0 04/30/2015 0437   VLDL 50* 04/30/2015 0437   LDLCALC 74 04/30/2015 0437    Additional studies/ records that were reviewed today include: Summarized above.    ASSESSMENT & PLAN:   1. CAD as above - doing well post-PCI. Continue ASA, BB, statin, Plavix. He reports an episode of gum bleeding followed by coughing up a speck of blood which he thinks may have been related to the gum bleeding. Suspect this is due to underlying gingivitis rather than an effect of the blood thinner itself. He has not seen his dentist in a while. We discussed healthy gum hygiene. I advised him to f/u with dentistry. He discussed the fleck of blood in sputum with his PCP whom he says felt this was likely related to the gum bleeding and did not recommend further workup at this time. Hgb and Plt were normal. Note overread of chest CT in 12/2015 showed benign pulmonary nodules otherwise nothing significant. He will monitor for further sx and let us know if this recurs. He says he does not feel like his stamina has fully returned. There is a  possibility this could still be due to high PVC burden but I do not hear any ectopy on exam and EKG is NSR. At this time the patient wishes to hold off on repeat event monitoring and wants to try increasing his exercise gradually to see if he can increase endurance. If he continues to feel sluggish, would repeat 24-hour Holter to reassess PVC load. 2. Frequent PVCs - see above. 3. HTN - controlled.  4. Dyslipidemia - recent LDL improved on current regimen. PCP plans to repeat in 4 months.  Disposition: F/u with [name] in 3 months.   Medication Adjustments/Labs and Tests Ordered: Current medicines are reviewed at length with the patient today.  Concerns regarding medicines are outlined above. Medication changes, Labs and Tests ordered today are summarized above and listed in the Patient Instructions accessible in Encounters.   Raechel Ache PA-C  02/19/2016 9:35 AM    [facility] [address], [place], [place]  [address] Phone: [phone]; Fax: [phone]

## 2016-02-19 ENCOUNTER — Other Ambulatory Visit (INDEPENDENT_AMBULATORY_CARE_PROVIDER_SITE_OTHER): Payer: Medicare HMO | Admitting: *Deleted

## 2016-02-19 ENCOUNTER — Ambulatory Visit (INDEPENDENT_AMBULATORY_CARE_PROVIDER_SITE_OTHER): Payer: Medicare HMO | Admitting: Physician Assistant

## 2016-02-19 ENCOUNTER — Encounter: Payer: Self-pay | Admitting: Physician Assistant

## 2016-02-19 VITALS — BP 120/84 | HR 65 | Ht 68.0 in | Wt 186.6 lb

## 2016-02-19 DIAGNOSIS — I2510 Atherosclerotic heart disease of native coronary artery without angina pectoris: Secondary | ICD-10-CM

## 2016-02-19 DIAGNOSIS — I493 Ventricular premature depolarization: Secondary | ICD-10-CM | POA: Diagnosis not present

## 2016-02-19 DIAGNOSIS — E785 Hyperlipidemia, unspecified: Secondary | ICD-10-CM

## 2016-02-19 DIAGNOSIS — R079 Chest pain, unspecified: Secondary | ICD-10-CM

## 2016-02-19 DIAGNOSIS — I10 Essential (primary) hypertension: Secondary | ICD-10-CM

## 2016-02-19 DIAGNOSIS — R938 Abnormal findings on diagnostic imaging of other specified body structures: Secondary | ICD-10-CM

## 2016-02-19 NOTE — Addendum Note (Signed)
Addended by: Eulis Foster on: 02/19/2016 09:06 AM   Modules accepted: Orders

## 2016-02-19 NOTE — Patient Instructions (Signed)
Medication Instructions:  Your physician recommends that you continue on your current medications as directed. Please refer to the Current Medication list given to you today.   Labwork: None ordered  Testing/Procedures: None ordered  Follow-Up: Your physician recommends that you schedule a follow-up appointment in: 3 MONTHS WITH DR. TURNER    Any Other Special Instructions Will Be Listed Below (If Applicable).     If you need a refill on your cardiac medications before your next appointment, please call your pharmacy.   

## 2016-03-07 ENCOUNTER — Encounter (INDEPENDENT_AMBULATORY_CARE_PROVIDER_SITE_OTHER): Payer: Medicare HMO | Admitting: Ophthalmology

## 2016-03-07 DIAGNOSIS — H35372 Puckering of macula, left eye: Secondary | ICD-10-CM | POA: Diagnosis not present

## 2016-03-07 DIAGNOSIS — H35033 Hypertensive retinopathy, bilateral: Secondary | ICD-10-CM

## 2016-03-07 DIAGNOSIS — H2511 Age-related nuclear cataract, right eye: Secondary | ICD-10-CM | POA: Diagnosis not present

## 2016-03-07 DIAGNOSIS — H3342 Traction detachment of retina, left eye: Secondary | ICD-10-CM | POA: Diagnosis not present

## 2016-03-07 DIAGNOSIS — H353122 Nonexudative age-related macular degeneration, left eye, intermediate dry stage: Secondary | ICD-10-CM | POA: Diagnosis not present

## 2016-03-07 DIAGNOSIS — H43813 Vitreous degeneration, bilateral: Secondary | ICD-10-CM | POA: Diagnosis not present

## 2016-03-07 DIAGNOSIS — H59032 Cystoid macular edema following cataract surgery, left eye: Secondary | ICD-10-CM | POA: Diagnosis not present

## 2016-03-07 DIAGNOSIS — H353111 Nonexudative age-related macular degeneration, right eye, early dry stage: Secondary | ICD-10-CM | POA: Diagnosis not present

## 2016-03-07 DIAGNOSIS — I10 Essential (primary) hypertension: Secondary | ICD-10-CM

## 2016-04-18 ENCOUNTER — Encounter (INDEPENDENT_AMBULATORY_CARE_PROVIDER_SITE_OTHER): Payer: Medicare HMO | Admitting: Ophthalmology

## 2016-04-18 DIAGNOSIS — H2511 Age-related nuclear cataract, right eye: Secondary | ICD-10-CM

## 2016-04-18 DIAGNOSIS — H59032 Cystoid macular edema following cataract surgery, left eye: Secondary | ICD-10-CM

## 2016-04-18 DIAGNOSIS — H353122 Nonexudative age-related macular degeneration, left eye, intermediate dry stage: Secondary | ICD-10-CM | POA: Diagnosis not present

## 2016-04-18 DIAGNOSIS — H353111 Nonexudative age-related macular degeneration, right eye, early dry stage: Secondary | ICD-10-CM | POA: Diagnosis not present

## 2016-04-18 DIAGNOSIS — H43813 Vitreous degeneration, bilateral: Secondary | ICD-10-CM

## 2016-04-18 DIAGNOSIS — H35033 Hypertensive retinopathy, bilateral: Secondary | ICD-10-CM

## 2016-04-18 DIAGNOSIS — H35373 Puckering of macula, bilateral: Secondary | ICD-10-CM | POA: Diagnosis not present

## 2016-04-18 DIAGNOSIS — I10 Essential (primary) hypertension: Secondary | ICD-10-CM | POA: Diagnosis not present

## 2016-04-26 ENCOUNTER — Encounter (HOSPITAL_COMMUNITY): Payer: Self-pay | Admitting: *Deleted

## 2016-05-23 ENCOUNTER — Encounter: Payer: Self-pay | Admitting: Cardiology

## 2016-05-23 ENCOUNTER — Encounter (INDEPENDENT_AMBULATORY_CARE_PROVIDER_SITE_OTHER): Payer: Self-pay

## 2016-05-23 ENCOUNTER — Ambulatory Visit (INDEPENDENT_AMBULATORY_CARE_PROVIDER_SITE_OTHER): Payer: Medicare HMO | Admitting: Cardiology

## 2016-05-23 VITALS — BP 134/78 | HR 47 | Ht 68.0 in | Wt 180.1 lb

## 2016-05-23 DIAGNOSIS — I2510 Atherosclerotic heart disease of native coronary artery without angina pectoris: Secondary | ICD-10-CM

## 2016-05-23 DIAGNOSIS — I493 Ventricular premature depolarization: Secondary | ICD-10-CM

## 2016-05-23 DIAGNOSIS — E785 Hyperlipidemia, unspecified: Secondary | ICD-10-CM | POA: Diagnosis not present

## 2016-05-23 DIAGNOSIS — I10 Essential (primary) hypertension: Secondary | ICD-10-CM | POA: Diagnosis not present

## 2016-05-23 LAB — HEPATIC FUNCTION PANEL
ALBUMIN: 4.2 g/dL (ref 3.6–5.1)
ALT: 20 U/L (ref 9–46)
AST: 18 U/L (ref 10–35)
Alkaline Phosphatase: 48 U/L (ref 40–115)
BILIRUBIN TOTAL: 1.6 mg/dL — AB (ref 0.2–1.2)
Bilirubin, Direct: 0.3 mg/dL — ABNORMAL HIGH (ref <=0.2)
Indirect Bilirubin: 1.3 mg/dL — ABNORMAL HIGH (ref 0.2–1.2)
Total Protein: 6.9 g/dL (ref 6.1–8.1)

## 2016-05-23 LAB — LIPID PANEL
CHOL/HDL RATIO: 2.9 ratio (ref <=5.0)
Cholesterol: 136 mg/dL (ref 125–200)
HDL: 47 mg/dL (ref >=40)
LDL Cholesterol: 71 mg/dL (ref <130)
Triglycerides: 89 mg/dL (ref <150)
VLDL: 18 mg/dL (ref <30)

## 2016-05-23 MED ORDER — DILTIAZEM HCL ER COATED BEADS 180 MG PO CP24: 180.0000 mg | ORAL_CAPSULE | Freq: Every day | ORAL | 3 refills | Status: DC

## 2016-05-23 NOTE — Progress Notes (Signed)
Cardiology Office Note    Date:  05/23/2016   ID:  Duane Reyes, DOB 10/26/1950, MRN [id number]  PCP:  Sherrie Mustache, MD  Cardiologist:  Fransico Him, MD   Chief Complaint  Patient presents with  . Coronary Artery Disease  . Hypertension  . Hyperlipidemia    History of Present Illness:  Duane Reyes is a 65 y.o. male with a history of HTN, dyslipidemia, PVCs and GERD who presents back for followup of CP.   He had a 2D echo showing normal LVF and nuclear stress test that showed no ischemia a year ago.  He continued to have CP and coronary CTA showed significant calcification of the coronary arteries and subsequently underwent cath which revealed 50% mid LAD, 25% OM2, 95% RPDA s/p PCI.  He is doing well.  He denies any chest pain, LE edema, dizziness, claudication or syncope. He has chronic SOB that is unchanged.  He complains of feeling fatigued a lot and feels his PVCs and PACs at times.     Past Medical History:  Diagnosis Date  . Arthritis    "left knee" (01/29/2016)  . CAD in native artery    a. Abnl cardiac CT -> LHC 01/2016 s/p 50% mLAD, 25% OM2, 95% RPDA which was treated with overlapping DES.  [name] Chronic lower back pain   . Frequent PVCs    a. 12/2015 - Holter showed SB, NSR, ST, HR 54-108, with frequent PVCs in singles, couplets, and bigemy, with elevated PVC load 24%.  [name] GERD (gastroesophageal reflux disease)   . History of shingles   . Hyperlipidemia   . Hypertension   . Hypothyroidism   . Prostate cancer ([place])   . Tortuosity of aortic arch, -would not pursue Rt radial approach in this patient for future cardiac caths     Past Surgical History:  Procedure Laterality Date  . BACK SURGERY    . CARDIAC CATHETERIZATION N/A 01/29/2016   Procedure: Right/Left Heart Cath and Coronary Angiography;  Surgeon: Jettie Booze, MD;  Location: [place] [facility];  Service: Cardiovascular;  Laterality: N/A;  . CARDIAC CATHETERIZATION N/A 01/29/2016   Procedure:  Coronary Stent Intervention 2.5/16mm Synergy-proximal PDA;  Surgeon: Jettie Booze, MD;  Location: [place] [facility];  Service: Cardiovascular;  Laterality: N/A;  . CATARACT EXTRACTION W/ INTRAOCULAR LENS IMPLANT Left 11/2015  . COLONOSCOPY    . CORONARY ANGIOPLASTY WITH STENT PLACEMENT  01/29/2016   "2 stents"  . CYST EXCISION Right 2005   "near bicep"  . ESOPHAGOGASTRODUODENOSCOPY    . KNEE ARTHROSCOPY Left 2012   "torn meniscus"  . POSTERIOR LUMBAR FUSION  02/2013   "L4-5; hardware in place"  . PROSTATE BIOPSY  2013  . PROSTATE SURGERY  2013   "brachytherapy for prostate radiation"  . TONSILLECTOMY  1950s    Current Medications: Outpatient Medications Prior to Visit  Medication Sig Dispense Refill  . acetaminophen (TYLENOL) 325 MG tablet Take 2 tablets (650 mg total) by mouth every 4 (four) hours as needed for headache or mild pain.    [name] aspirin 81 MG chewable tablet Chew 81 mg by mouth daily.    [name] atorvastatin (LIPITOR) 40 MG tablet Take 1 tablet (40 mg total) by mouth daily at 6 PM. 30 tablet 6  . b complex vitamins tablet Take 1 tablet by mouth daily.    . Brinzolamide-Brimonidine (SIMBRINZA) 1-0.2 % SUSP Place 1 drop into the left eye daily.    . clopidogrel (PLAVIX) 75  MG tablet Take 1 tablet (75 mg total) by mouth daily with breakfast. 90 tablet 3  . DUREZOL 0.05 % EMUL Place 1 drop into the left eye daily.  1  . levothyroxine (SYNTHROID, LEVOTHROID) 112 MCG tablet Take 112 mcg by mouth daily before breakfast.    . metoprolol succinate (TOPROL XL) 25 MG 24 hr tablet Take 1 tablet (25 mg total) by mouth daily. 90 tablet 3  . nepafenac (ILEVRO) 0.3 % ophthalmic suspension Place 1 drop into the left eye daily.    . nitroGLYCERIN (NITROSTAT) 0.4 MG SL tablet Place 0.4 mg under the tongue every 5 (five) minutes as needed for chest pain (x 3 doses).    . pantoprazole (PROTONIX) 40 MG tablet Take 1 tablet (40 mg total) by mouth daily. 90 tablet 3   No facility-administered  medications prior to visit.      Allergies:   Topamax [topiramate]; Celebrex [celecoxib]; Crestor [rosuvastatin calcium]; and Pitavastatin   Social History   Social History  . Marital status: Married    Spouse name: N/A  . Number of children: N/A  . Years of education: N/A   Social History Main Topics  . Smoking status: Never Smoker  . Smokeless tobacco: Never Used  . Alcohol use Yes     Comment: 01/29/2016 "glass of wine q couple months"  . Drug use: No  . Sexual activity: Not Currently   Other Topics Concern  . None   Social History Narrative  . None     Family History:  The patient's family history includes Heart attack in his father; Heart disease in his father.   ROS:   Please see the history of present illness.    Review of Systems  Constitution: Positive for malaise/fatigue.   All other systems reviewed and are negative.  No flowsheet data found.     PHYSICAL EXAM:   VS:  BP 134/78   Pulse (!) 47   Ht 5\' 8"  (1.727 m)   Wt 180 lb 1.9 oz (81.7 kg)   SpO2 97%   BMI 27.39 kg/m    GEN: Well nourished, well developed, in no acute distress  HEENT: normal  Neck: no JVD, carotid bruits, or masses Cardiac: RRR; no murmurs, rubs, or gallops,no edema.  Intact distal pulses bilaterally.  Respiratory:  clear to auscultation bilaterally, normal work of breathing GI: soft, nontender, nondistended, + BS MS: no deformity or atrophy  Skin: warm and dry, no rash Neuro:  Alert and Oriented x 3, Strength and sensation are intact Psych: euthymic mood, full affect  Wt Readings from Last 3 Encounters:  05/23/16 180 lb 1.9 oz (81.7 kg)  02/19/16 186 lb 9.6 oz (84.6 kg)  01/30/16 190 lb 11.2 oz (86.5 kg)      Studies/Labs Reviewed:   EKG:  EKG is ordered today and showed NSR with PACs and PVCs with HR 77bpm, inferior infarct and IRBBB  Recent Labs: 01/27/2016: ALT 17; Magnesium 2.2; TSH 1.45 01/30/2016: BUN 14; Creatinine, Ser 1.09; Hemoglobin 14.1; Platelets 149;  Potassium 3.9; Sodium 137   Lipid Panel    Component Value Date/Time   CHOL 166 04/30/2015 0437   TRIG 249 (H) 04/30/2015 0437   HDL 42 04/30/2015 0437   CHOLHDL 4.0 04/30/2015 0437   VLDL 50 (H) 04/30/2015 0437   LDLCALC 74 04/30/2015 0437    Additional studies/ records that were reviewed today include:  none    ASSESSMENT:    1. CAD in native artery  2. Essential hypertension   3. PVC (premature ventricular contraction)   4. Hyperlipidemia LDL goal <100      PLAN:  In order of problems listed above:  1.  ASCAD with cath 02/2016 with  50% mid LAD, 25% OM2, 95% RPDA s/p PCI.  He is on DAPT/statin. 2.  HTN - BP controlled on current meds.  See below - stop metoprolol and start Cardizem..   3.  PVCs - symptomatic.  He is fatigued which is likely coming from the BB.  I will change his metoprolol to Cardizem CD 180mg  daily to see if we can suppress his PVCs and get rid of the fatigue that is likely coming from the lopressor.   4.  Hyperlipidemia with LDL goal < 70.  Continue statin. Check FLP and ALT.     Medication Adjustments/Labs and Tests Ordered: Current medicines are reviewed at length with the patient today.  Concerns regarding medicines are outlined above.  Medication changes, Labs and Tests ordered today are listed in the Patient Instructions below.  There are no Patient Instructions on file for this visit.   Signed, Fransico Him, MD  05/23/2016 9:20 AM    [facility] [address], [place], [place]  [address] Phone: [phone]; Fax: [phone]

## 2016-05-23 NOTE — Patient Instructions (Addendum)
Medication Instructions:  1) STOP METOPROLOL 2) START CARDIZEM CD 180 mg daily  Labwork: TODAY: LIPIDS, LFTs  Testing/Procedures: None  Follow-Up: Your physician wants you to follow-up in: 6 months with [name]. You will receive a reminder letter in the mail two months in advance. If you don't receive a letter, please call our office to schedule the follow-up appointment.   Any Other Special Instructions Will Be Listed Below (If Applicable).     If you need a refill on your cardiac medications before your next appointment, please call your pharmacy.

## 2016-05-30 ENCOUNTER — Encounter (INDEPENDENT_AMBULATORY_CARE_PROVIDER_SITE_OTHER): Payer: Medicare HMO | Admitting: Ophthalmology

## 2016-05-30 DIAGNOSIS — I10 Essential (primary) hypertension: Secondary | ICD-10-CM | POA: Diagnosis not present

## 2016-05-30 DIAGNOSIS — H35373 Puckering of macula, bilateral: Secondary | ICD-10-CM

## 2016-05-30 DIAGNOSIS — H35033 Hypertensive retinopathy, bilateral: Secondary | ICD-10-CM

## 2016-05-30 DIAGNOSIS — H353122 Nonexudative age-related macular degeneration, left eye, intermediate dry stage: Secondary | ICD-10-CM

## 2016-05-30 DIAGNOSIS — H43813 Vitreous degeneration, bilateral: Secondary | ICD-10-CM | POA: Diagnosis not present

## 2016-05-30 DIAGNOSIS — H59032 Cystoid macular edema following cataract surgery, left eye: Secondary | ICD-10-CM

## 2016-05-30 DIAGNOSIS — H353111 Nonexudative age-related macular degeneration, right eye, early dry stage: Secondary | ICD-10-CM

## 2016-07-11 ENCOUNTER — Ambulatory Visit (INDEPENDENT_AMBULATORY_CARE_PROVIDER_SITE_OTHER): Payer: Medicare HMO | Admitting: Ophthalmology

## 2016-07-11 DIAGNOSIS — H353111 Nonexudative age-related macular degeneration, right eye, early dry stage: Secondary | ICD-10-CM | POA: Diagnosis not present

## 2016-07-11 DIAGNOSIS — H2511 Age-related nuclear cataract, right eye: Secondary | ICD-10-CM

## 2016-07-11 DIAGNOSIS — H43813 Vitreous degeneration, bilateral: Secondary | ICD-10-CM

## 2016-07-11 DIAGNOSIS — H35373 Puckering of macula, bilateral: Secondary | ICD-10-CM | POA: Diagnosis not present

## 2016-07-11 DIAGNOSIS — H59032 Cystoid macular edema following cataract surgery, left eye: Secondary | ICD-10-CM

## 2016-07-11 DIAGNOSIS — H353122 Nonexudative age-related macular degeneration, left eye, intermediate dry stage: Secondary | ICD-10-CM | POA: Diagnosis not present

## 2016-08-30 ENCOUNTER — Encounter (INDEPENDENT_AMBULATORY_CARE_PROVIDER_SITE_OTHER): Payer: Medicare HMO | Admitting: Ophthalmology

## 2016-08-30 DIAGNOSIS — H353132 Nonexudative age-related macular degeneration, bilateral, intermediate dry stage: Secondary | ICD-10-CM | POA: Diagnosis not present

## 2016-08-30 DIAGNOSIS — H59032 Cystoid macular edema following cataract surgery, left eye: Secondary | ICD-10-CM

## 2016-08-30 DIAGNOSIS — H35372 Puckering of macula, left eye: Secondary | ICD-10-CM

## 2016-08-30 DIAGNOSIS — I10 Essential (primary) hypertension: Secondary | ICD-10-CM | POA: Diagnosis not present

## 2016-08-30 DIAGNOSIS — H35033 Hypertensive retinopathy, bilateral: Secondary | ICD-10-CM | POA: Diagnosis not present

## 2016-08-30 DIAGNOSIS — H43813 Vitreous degeneration, bilateral: Secondary | ICD-10-CM

## 2016-09-22 ENCOUNTER — Telehealth: Payer: Self-pay | Admitting: Cardiology

## 2016-09-22 NOTE — Telephone Encounter (Signed)
Left message to call back  

## 2016-09-22 NOTE — Telephone Encounter (Signed)
New Message  Pt voiced wanting to speak with nurse.  Please f/u with pt

## 2016-09-30 NOTE — Telephone Encounter (Signed)
Patient states that he has had SOB, no energy, a nagging cough, and gives out easy since his stent placement in June 2017. The patient states that his pharmacy friends think that it could be related to the diltiazem that he takes. He states this is nothing urgent as it has been going on for a while, but wanted to ask [name] about this.  Also, patient has an eye appointment on 10/11/16 to discuss possible surgery on his retina for which he will require anesthesia and would like to get clearance.   Message routed to [name] and her RN.

## 2016-09-30 NOTE — Telephone Encounter (Signed)
In regards to retinal surgery unless it is an emergency he needs to stay on ASA and Plavix until 02/2017

## 2016-09-30 NOTE — Telephone Encounter (Signed)
New Message ° °Pt voiced returning nurses call. ° °Please f/u °

## 2016-09-30 NOTE — Telephone Encounter (Signed)
Patient denies any pain or symptoms of angina since his cath. Patient was advised to follow up with his PCP. Regarding his retinal surgery, patient was advised that unless it is an emergency to remain on ASA and Plavix until 02/2017. Patient verbalized understanding.

## 2016-09-30 NOTE — Telephone Encounter (Signed)
Please make sure he has not had any recurrent anginal symptoms since his cath. If not then he needs to see his PCP

## 2016-10-11 ENCOUNTER — Telehealth: Payer: Self-pay

## 2016-10-11 ENCOUNTER — Encounter (INDEPENDENT_AMBULATORY_CARE_PROVIDER_SITE_OTHER): Payer: Medicare HMO | Admitting: Ophthalmology

## 2016-10-11 DIAGNOSIS — H35033 Hypertensive retinopathy, bilateral: Secondary | ICD-10-CM | POA: Diagnosis not present

## 2016-10-11 DIAGNOSIS — H59032 Cystoid macular edema following cataract surgery, left eye: Secondary | ICD-10-CM | POA: Diagnosis not present

## 2016-10-11 DIAGNOSIS — H43813 Vitreous degeneration, bilateral: Secondary | ICD-10-CM

## 2016-10-11 DIAGNOSIS — I10 Essential (primary) hypertension: Secondary | ICD-10-CM | POA: Diagnosis not present

## 2016-10-11 DIAGNOSIS — H35373 Puckering of macula, bilateral: Secondary | ICD-10-CM

## 2016-10-11 NOTE — Telephone Encounter (Signed)
SENT NOTES TO SCHEDULING 

## 2016-10-14 ENCOUNTER — Encounter: Payer: Self-pay | Admitting: *Deleted

## 2016-10-26 ENCOUNTER — Ambulatory Visit (INDEPENDENT_AMBULATORY_CARE_PROVIDER_SITE_OTHER): Payer: Medicare HMO | Admitting: Cardiology

## 2016-10-26 ENCOUNTER — Encounter: Payer: Self-pay | Admitting: Cardiology

## 2016-10-26 VITALS — BP 120/76 | HR 44 | Ht 67.5 in | Wt 184.0 lb

## 2016-10-26 DIAGNOSIS — I493 Ventricular premature depolarization: Secondary | ICD-10-CM | POA: Diagnosis not present

## 2016-10-26 DIAGNOSIS — R0609 Other forms of dyspnea: Secondary | ICD-10-CM | POA: Diagnosis not present

## 2016-10-26 DIAGNOSIS — E785 Hyperlipidemia, unspecified: Secondary | ICD-10-CM | POA: Diagnosis not present

## 2016-10-26 DIAGNOSIS — T50905A Adverse effect of unspecified drugs, medicaments and biological substances, initial encounter: Secondary | ICD-10-CM

## 2016-10-26 DIAGNOSIS — I2510 Atherosclerotic heart disease of native coronary artery without angina pectoris: Secondary | ICD-10-CM | POA: Diagnosis not present

## 2016-10-26 DIAGNOSIS — R001 Bradycardia, unspecified: Secondary | ICD-10-CM | POA: Diagnosis not present

## 2016-10-26 DIAGNOSIS — I10 Essential (primary) hypertension: Secondary | ICD-10-CM | POA: Diagnosis not present

## 2016-10-26 HISTORY — DX: Bradycardia, unspecified: R00.1

## 2016-10-26 LAB — HEPATIC FUNCTION PANEL
ALT: 32 IU/L (ref 0–44)
AST: 25 IU/L (ref 0–40)
Albumin: 4.3 g/dL (ref 3.6–4.8)
Alkaline Phosphatase: 58 IU/L (ref 39–117)
BILIRUBIN TOTAL: 1.2 mg/dL (ref 0.0–1.2)
BILIRUBIN, DIRECT: 0.25 mg/dL (ref 0.00–0.40)
TOTAL PROTEIN: 6.7 g/dL (ref 6.0–8.5)

## 2016-10-26 LAB — LIPID PANEL
CHOLESTEROL TOTAL: 149 mg/dL (ref 100–199)
Chol/HDL Ratio: 2.8 ratio units (ref 0.0–5.0)
HDL: 53 mg/dL (ref >39)
LDL Calculated: 73 mg/dL (ref 0–99)
Triglycerides: 116 mg/dL (ref 0–149)
VLDL CHOLESTEROL CAL: 23 mg/dL (ref 5–40)

## 2016-10-26 MED ORDER — AMLODIPINE BESYLATE 5 MG PO TABS: 5.0000 mg | ORAL_TABLET | Freq: Every day | ORAL | 3 refills | Status: DC

## 2016-10-26 NOTE — Progress Notes (Signed)
Cardiology Office Note    Date:  10/26/2016   ID:  Duane Reyes, DOB March 30, 1951, MRN [id number]  PCP:  Sherrie Mustache, MD  Cardiologist:  Fransico Him, MD   Chief Complaint  Patient presents with  . Coronary Artery Disease  . Hypertension  . Hyperlipidemia    History of Present Illness:  Duane Reyes is a 66 y.o. male with a history of HTN, dyslipidemia, PVCs and GERD who presents back for followup of CP.  He had a 2D echo showing normal LVF and nuclear stress test that showed no ischemia but due to continue CP, he had a  coronary CTA showing significant calcification of the coronary arteries and subsequently underwent cath 01/2016 which revealed 50% mid LAD, 25% OM2, 95% RPDA s/p PCI.  He denies any chest pain, LE edema, dizziness, claudication or syncope. He has chronic SOB that never improved after the stent.  He is still having the SOB and thinks it may be worse.  He has not had any PND or orthopnea.  His weight has been stable. He is concerned about what is causing his SOB.     Past Medical History:  Diagnosis Date  . Arthritis    "left knee" (01/29/2016)  . 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 CAD in native artery    a. Abnl cardiac CT -> LHC 01/2016 s/p 50% mLAD, 25% OM2, 95% RPDA which was treated with overlapping DES.  [name] Chronic lower back pain   . Frequent PVCs    a. 12/2015 - Holter showed SB, NSR, ST, HR 54-108, with frequent PVCs in singles, couplets, and bigemy, with elevated PVC load 24%.  [name] GERD (gastroesophageal reflux disease)   . History of shingles   . Hyperlipidemia   . Hypertension   . Hypothyroidism   . Prostate cancer ([place])   . Tortuosity of aortic arch, -would not pursue Rt radial approach in this patient for future cardiac caths     Past Surgical History:  Procedure Laterality Date  . BACK SURGERY    . CARDIAC CATHETERIZATION N/A 01/29/2016   Procedure: Right/Left Heart Cath and Coronary Angiography;  Surgeon: Jettie Booze, MD;   Location: [facility];  Service: Cardiovascular;  Laterality: N/A;  . CARDIAC CATHETERIZATION N/A 01/29/2016   Procedure: Coronary Stent Intervention 2.5/16mm Synergy-proximal PDA;  Surgeon: Jettie Booze, MD;  Location: [facility];  Service: Cardiovascular;  Laterality: N/A;  . CATARACT EXTRACTION W/ INTRAOCULAR LENS IMPLANT Left 11/2015  . COLONOSCOPY    . CORONARY ANGIOPLASTY WITH STENT PLACEMENT  01/29/2016   "2 stents"  . CYST EXCISION Right 2005   "near bicep"  . ESOPHAGOGASTRODUODENOSCOPY    . KNEE ARTHROSCOPY Left 2012   "torn meniscus"  . POSTERIOR LUMBAR FUSION  02/2013   "L4-5; hardware in place"  . PROSTATE BIOPSY  2013  . PROSTATE SURGERY  2013   "brachytherapy for prostate radiation"  . TONSILLECTOMY  1950s    Current Medications: Current Meds  Medication Sig  . acetaminophen (TYLENOL) 325 MG tablet Take 2 tablets (650 mg total) by mouth every 4 (four) hours as needed for headache or mild pain.  [name] aspirin 81 MG chewable tablet Chew 81 mg by mouth daily.  [name] atorvastatin (LIPITOR) 40 MG tablet Take 1 tablet (40 mg total) by mouth daily at 6 PM.  . b complex vitamins tablet Take 1 tablet by mouth daily.  . Brinzolamide-Brimonidine (SIMBRINZA) 1-0.2 % SUSP Place 1 drop into the  left eye daily.  . clopidogrel (PLAVIX) 75 MG tablet Take 1 tablet (75 mg total) by mouth daily with breakfast.  . diltiazem (CARDIZEM CD) 180 MG 24 hr capsule Take 1 capsule (180 mg total) by mouth daily.  . DUREZOL 0.05 % EMUL Place 1 drop into the left eye daily.  [name] levothyroxine (SYNTHROID, LEVOTHROID) 112 MCG tablet Take 112 mcg by mouth daily before breakfast.  . nepafenac (ILEVRO) 0.3 % ophthalmic suspension Place 1 drop into the left eye daily.  . nitroGLYCERIN (NITROSTAT) 0.4 MG SL tablet Place 0.4 mg under the tongue every 5 (five) minutes as needed for chest pain (x 3 doses).  . pantoprazole (PROTONIX) 40 MG tablet Take 1 tablet (40 mg total) by mouth daily.     Allergies:   Topamax [topiramate]; Celebrex [celecoxib]; Crestor [rosuvastatin calcium]; and Pitavastatin   Social History   Social History  . Marital status: Married    Spouse name: N/A  . Number of children: N/A  . Years of education: N/A   Social History Main Topics  . Smoking status: Never Smoker  . Smokeless tobacco: Never Used  . Alcohol use Yes     Comment: 01/29/2016 "glass of wine q couple months"  . Drug use: No  . Sexual activity: Not Currently   Other Topics Concern  . None   Social History Narrative  . None     Family History:  The patient's family history includes Heart attack in his father; Heart disease in his father.   ROS:   Please see the history of present illness.    ROS All other systems reviewed and are negative.  No flowsheet data found.     PHYSICAL EXAM:   VS:  BP 120/76   Pulse (!) 44   Ht 5' 7.5" (1.715 m)   Wt 184 lb (83.5 kg)   BMI 28.39 kg/m    GEN: Well nourished, well developed, in no acute distress  HEENT: normal  Neck: no JVD, carotid bruits, or masses Cardiac: RRR; no murmurs, rubs, or gallops,no edema.  Intact distal pulses bilaterally.  Respiratory:  clear to auscultation bilaterally, normal work of breathing GI: soft, nontender, nondistended, + BS MS: no deformity or atrophy  Skin: warm and dry, no rash Neuro:  Alert and Oriented x 3, Strength and sensation are intact Psych: euthymic mood, full affect  Wt Readings from Last 3 Encounters:  10/26/16 184 lb (83.5 kg)  05/23/16 180 lb 1.9 oz (81.7 kg)  02/19/16 186 lb 9.6 oz (84.6 kg)      Studies/Labs Reviewed:   EKG:  EKG is not ordered today.    Recent Labs: 01/27/2016: Magnesium 2.2; TSH 1.45 01/30/2016: BUN 14; Creatinine, Ser 1.09; Hemoglobin 14.1; Platelets 149; Potassium 3.9; Sodium 137 05/23/2016: ALT 20   Lipid Panel    Component Value Date/Time   CHOL 136 05/23/2016 0949   TRIG 89 05/23/2016 0949   HDL 47 05/23/2016 0949   CHOLHDL 2.9 05/23/2016  0949   VLDL 18 05/23/2016 0949   LDLCALC 71 05/23/2016 0949    Additional studies/ records that were reviewed today include:  none    ASSESSMENT:    1. CAD in native artery   2. Essential hypertension   3. Hyperlipidemia LDL goal <100   4. Bradycardia, drug induced   5. PVC (premature ventricular contraction)   6. DOE (dyspnea on exertion)      PLAN:  In order of problems listed above:  1. ASCAD 1 vessel obstructive  with 50% mid LAD, 25% OM2, 95% RPDA s/p PCI.  He has not had any further anginal chest pain.  He will continue on ASA/Plavix and statin.  2. HTN - BP controlled on current meds but he is bradycardic so I am going to change is Cardizem to amlodipine 5mg  daily.   3. Hyperlipidemia with LDL goal < 70.  He will continue on statin.  LDL 04/2016 was 71. 4. Drug induced bradycardia - he is fatigued which likely is due to bradycardia.  I will change his Cardizem to amlodipine for his BP control.  5. PVCs - these have been suppressed on Cardizem but now having fatigue.  I am stopping his cardizem and he will let me know if his PVCs return.  6. DOE - I do not think this is cardiac.  He has had SOB for years and it did not improve with PCI.  His echo has been normal with no diastolic dysfunction.  He has severe scoliosis and I suspect that this may be causing some restrictive lung disease.  I will get PFTs with DLCO.    Medication Adjustments/Labs and Tests Ordered: Current medicines are reviewed at length with the patient today.  Concerns regarding medicines are outlined above.  Medication changes, Labs and Tests ordered today are listed in the Patient Instructions below.  There are no Patient Instructions on file for this visit.   Signed, Fransico Him, MD  10/26/2016 9:04 AM    [facility] [address], [place], [place]  [address] Phone: [phone]; Fax: [phone]

## 2016-10-26 NOTE — Patient Instructions (Signed)
Medication Instructions:  1) STOP CARDIZEM 2) START AMLODIPINE 5 mg daily  Labwork: TODAY: LFTs, Lipids  Testing/Procedures: Your physician has recommended that you have a pulmonary function test. Pulmonary Function Tests are a group of tests that measure how well air moves in and out of your lungs.  Follow-Up: Your physician recommends that you schedule a follow-up appointment in 4 weeks with Dr. Theodosia Blender assistant.  Your physician wants you to follow-up in: 1 year with [name]. You will receive a reminder letter in the mail two months in advance. If you don't receive a letter, please call our office to schedule the follow-up appointment.   Any Other Special Instructions Will Be Listed Below (If Applicable).     If you need a refill on your cardiac medications before your next appointment, please call your pharmacy.

## 2016-11-23 ENCOUNTER — Ambulatory Visit: Payer: Medicare HMO | Admitting: Cardiology

## 2016-11-29 ENCOUNTER — Ambulatory Visit (INDEPENDENT_AMBULATORY_CARE_PROVIDER_SITE_OTHER): Payer: Medicare HMO | Admitting: Internal Medicine

## 2016-11-29 DIAGNOSIS — R0609 Other forms of dyspnea: Secondary | ICD-10-CM | POA: Diagnosis not present

## 2016-11-29 LAB — PULMONARY FUNCTION TEST
DL/VA % pred: 112 %
DL/VA: 4.87 ml/min/mmHg/L
DLCO COR: 20.24 ml/min/mmHg
DLCO UNC % PRED: 74 %
DLCO UNC: 20.12 ml/min/mmHg
DLCO cor % pred: 75 %
FEF 25-75 Post: 2.83 L/sec
FEF 25-75 Pre: 2.22 L/sec
FEF2575-%CHANGE-POST: 27 %
FEF2575-%Pred-Post: 124 %
FEF2575-%Pred-Pre: 97 %
FEV1-%CHANGE-POST: 3 %
FEV1-%PRED-PRE: 76 %
FEV1-%Pred-Post: 79 %
FEV1-POST: 2.3 L
FEV1-Pre: 2.22 L
FEV1FVC-%CHANGE-POST: 3 %
FEV1FVC-%Pred-Pre: 110 %
FEV6-%Change-Post: 0 %
FEV6-%PRED-POST: 74 %
FEV6-%Pred-Pre: 74 %
FEV6-PRE: 2.72 L
FEV6-Post: 2.73 L
FEV6FVC-%PRED-PRE: 106 %
FEV6FVC-%Pred-Post: 106 %
FVC-%Change-Post: 0 %
FVC-%PRED-PRE: 69 %
FVC-%Pred-Post: 70 %
FVC-Post: 2.73 L
FVC-Pre: 2.72 L
POST FEV1/FVC RATIO: 84 %
PRE FEV6/FVC RATIO: 100 %
Post FEV6/FVC ratio: 100 %
Pre FEV1/FVC ratio: 82 %

## 2016-11-29 NOTE — Progress Notes (Signed)
PFT done today. 

## 2016-12-02 ENCOUNTER — Telehealth: Payer: Self-pay

## 2016-12-02 DIAGNOSIS — R942 Abnormal results of pulmonary function studies: Secondary | ICD-10-CM

## 2016-12-02 NOTE — Telephone Encounter (Signed)
-----   Message from Sueanne Margarita, MD sent at 11/29/2016  7:56 PM EDT ----- Very abnormal PFTs. - please refer to Dr. Donnie Mesa - likely etiology of patient's SOB

## 2016-12-02 NOTE — Telephone Encounter (Signed)
Informed patient of results and verbal understanding expressed.  Pulmonary referral placed. Patient agrees with treatment plan. 

## 2016-12-13 ENCOUNTER — Ambulatory Visit (INDEPENDENT_AMBULATORY_CARE_PROVIDER_SITE_OTHER): Payer: Medicare HMO | Admitting: Cardiology

## 2016-12-13 ENCOUNTER — Encounter: Payer: Self-pay | Admitting: Cardiology

## 2016-12-13 ENCOUNTER — Encounter (INDEPENDENT_AMBULATORY_CARE_PROVIDER_SITE_OTHER): Payer: Self-pay

## 2016-12-13 VITALS — BP 140/60 | HR 81 | Ht 68.0 in | Wt 181.1 lb

## 2016-12-13 DIAGNOSIS — I493 Ventricular premature depolarization: Secondary | ICD-10-CM

## 2016-12-13 NOTE — Progress Notes (Signed)
12/13/2016 SANDFORD Reyes   08/29/1951  [id number]  Primary Physician Sherrie Mustache, MD Primary Cardiologist: [name]    Reason for Visit/CC: F/u for PVCs and fatigue  HPI:  Duane Reyes is a 66 y.o. male, followed by [name], who presents to clinic today for f/u. He has a h/o CAD. He initially underwent CP w/u with a NST that showed no ischemia and normal LVEF, however given continued CP and dyspnea, [name] elected to order a coronary CTA that showed significant calcification of the coronary arteries. This lead to a [place] 01/2016 and he was found to have 50% mid LAD, 25% OM2, 95% RPDA s/p PCI. Despite PCI, he continued to have significant dyspnea. [name] subsequently ordered PFTs which were markedly abnormal. A referral has been placed to see pulmonology. He has a new patient appt with Dr. Lamonte Sakai 01/04/17.  Of note, he also has a h/o PVCs. He was previously on Cardizem but this had to be discontinued due to severe fatigue and bradycardia. This was discontinued at his last OV. Amlodipine was added for additional BP control. He also has hypothyroidism, followed by his PCP Dr. Edrick Oh. He is on thyroid replacement therapy with Synthroid, 112 mcg daily.   He presents back to clinic today for f/u. EKG shows SR with frequent PVCs in a pattern of bigeminy. HR is 81 bpm. He notes he feels "gittery" often. He still feels tired and notes poor exercise tolerance. He hasn't noticed much improvement in symptoms after stopping Cardizem. He denies CP. BP is 140/60, however he has not yet taken his amlodipine this morning. He was waiting to take an hour after taking his Synthroid.    Current Meds  Medication Sig  . acetaminophen (TYLENOL) 325 MG tablet Take 2 tablets (650 mg total) by mouth every 4 (four) hours as needed for headache or mild pain.  [name] amLODipine (NORVASC) 5 MG tablet Take 1 tablet (5 mg total) by mouth daily.  [name] aspirin 81 MG chewable tablet Chew 81 mg by mouth daily.  [name]  atorvastatin (LIPITOR) 40 MG tablet Take 1 tablet (40 mg total) by mouth daily at 6 PM.  . b complex vitamins tablet Take 1 tablet by mouth daily.  . Brinzolamide-Brimonidine (SIMBRINZA) 1-0.2 % SUSP Place 1 drop into the left eye daily.  . clopidogrel (PLAVIX) 75 MG tablet Take 1 tablet (75 mg total) by mouth daily with breakfast.  . DUREZOL 0.05 % EMUL Place 1 drop into the left eye daily.  [name] levothyroxine (SYNTHROID, LEVOTHROID) 112 MCG tablet Take 112 mcg by mouth daily before breakfast.  . nepafenac (ILEVRO) 0.3 % ophthalmic suspension Place 1 drop into the left eye daily.  . nitroGLYCERIN (NITROSTAT) 0.4 MG SL tablet Place 0.4 mg under the tongue every 5 (five) minutes as needed for chest pain (x 3 doses).  . pantoprazole (PROTONIX) 40 MG tablet Take 1 tablet (40 mg total) by mouth daily.   Allergies  Allergen Reactions  . Topamax [Topiramate] Other (See Comments)    blurred vision, tired, ringing in ears, restless, confussion blurred vision, tired, ringing in ears, restless, confussion  . Celebrex [Celecoxib]     unknown  . Crestor [Rosuvastatin Calcium]     myalgias  . Pitavastatin Rash    Per patient had a rash and a cough   Past Medical History:  Diagnosis Date  . Arthritis    "left knee" (01/29/2016)  . 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 CAD in native artery  a. Abnl cardiac CT -> LHC 01/2016 s/p 50% mLAD, 25% OM2, 95% RPDA which was treated with overlapping DES.  [name] Chronic lower back pain   . Frequent PVCs    a. 12/2015 - Holter showed SB, NSR, ST, HR 54-108, with frequent PVCs in singles, couplets, and bigemy, with elevated PVC load 24%.  [name] GERD (gastroesophageal reflux disease)   . History of shingles   . Hyperlipidemia   . Hypertension   . Hypothyroidism   . Prostate cancer ([place])   . Tortuosity of aortic arch, -would not pursue Rt radial approach in this patient for future cardiac caths    Family History  Problem Relation Age of Onset  . Heart attack Father    . Heart disease Father    Past Surgical History:  Procedure Laterality Date  . BACK SURGERY    . CARDIAC CATHETERIZATION N/A 01/29/2016   Procedure: Right/Left Heart Cath and Coronary Angiography;  Surgeon: Jettie Booze, MD;  Location: [facility];  Service: Cardiovascular;  Laterality: N/A;  . CARDIAC CATHETERIZATION N/A 01/29/2016   Procedure: Coronary Stent Intervention 2.5/16mm Synergy-proximal PDA;  Surgeon: Jettie Booze, MD;  Location: [facility];  Service: Cardiovascular;  Laterality: N/A;  . CATARACT EXTRACTION W/ INTRAOCULAR LENS IMPLANT Left 11/2015  . COLONOSCOPY    . CORONARY ANGIOPLASTY WITH STENT PLACEMENT  01/29/2016   "2 stents"  . CYST EXCISION Right 2005   "near bicep"  . ESOPHAGOGASTRODUODENOSCOPY    . KNEE ARTHROSCOPY Left 2012   "torn meniscus"  . POSTERIOR LUMBAR FUSION  02/2013   "L4-5; hardware in place"  . PROSTATE BIOPSY  2013  . PROSTATE SURGERY  2013   "brachytherapy for prostate radiation"  . TONSILLECTOMY  1950s   Social History   Social History  . Marital status: Married    Spouse name: N/A  . Number of children: N/A  . Years of education: N/A   Occupational History  . Not on file.   Social History Main Topics  . Smoking status: Never Smoker  . Smokeless tobacco: Never Used  . Alcohol use Yes     Comment: 01/29/2016 "glass of wine q couple months"  . Drug use: No  . Sexual activity: Not Currently   Other Topics Concern  . Not on file   Social History Narrative  . No narrative on file     Review of Systems: General: negative for chills, fever, night sweats or weight changes.  Cardiovascular: negative for chest pain, dyspnea on exertion, edema, orthopnea, palpitations, paroxysmal nocturnal dyspnea or shortness of breath Dermatological: negative for rash Respiratory: negative for cough or wheezing Urologic: negative for hematuria Abdominal: negative for nausea, vomiting, diarrhea, bright red blood per rectum,  melena, or hematemesis Neurologic: negative for visual changes, syncope, or dizziness All other systems reviewed and are otherwise negative except as noted above.   Physical Exam:  Blood pressure 140/60, pulse 81, height 5\' 8"  (1.727 m), weight 181 lb 1.9 oz (82.2 kg).  General appearance: alert, cooperative and no distress Neck: no carotid bruit and no JVD Lungs: clear to auscultation bilaterally Heart: PVCs, regular rate Extremities: extremities normal, atraumatic, no cyanosis or edema and scoliosis of the spine Pulses: 2+ and symmetric Skin: Skin color, texture, turgor normal. No rashes or lesions Neurologic: Grossly normal  EKG PVCs w/ bigeminy 81 bpm  -- personally reviewed   ASSESSMENT AND PLAN:   1. PVCs: He continues to have frequent PVCs/ bigeminy. He was unable to  tolerate Cardizem given severe fatigue and bradycardia. He continues to have fatigue and decreased exercise tolerance, despite stopping Cardizem. Given his complaints an inability to tolerate AVN blocking agents to suppress his PVCs, I have recommended a 48 hr holter monitor to assess PVC burden. If high PVC rate, I will refer him to EP for consideration for other therapies. I doubt he would be a candidate for antiarrythmics. He would not be a candidate for amiodarone given his abnormal PFTs/dyspnea and not a candidate for flecainide given his CAD. If high PVC burden, may need to consider PVC ablation. We will also need to ensure that he is not over treated with thyroid replacement. He notes his PCP recently checked thyroid function test. We will request a copy of his labs be faxed to our office.    2. CAD: s/p PCI to RPDA 01/2016. He denies CP. Continue Plavix and statin.   3. Abnormal PFTs: pulmonology referral placed. He has a new patient appt with Dr. Lamonte Sakai 01/04/17  4. HTN: controlled on current regimen.  5. Hypothyroidism: on Synthroid. Followed by PCP. We will request recent labs from PCP, given his frequent  PVC's/bigeminy.    Follow-Up to be determined after 48 hr holter monitor results. If high PVC burden, will refer to EP for consideration for PVC ablation.   Brittainy Ladoris Gene, MHS [facility] 12/13/2016 9:36 AM

## 2016-12-13 NOTE — Patient Instructions (Signed)
Medication Instructions:    Your physician recommends that you continue on your current medications as directed. Please refer to the Current Medication list given to you today.   If you need a refill on your cardiac medications before your next appointment, please call your pharmacy.  Labwork: NONE ORDERED  TODAY     Testing/Procedures: Your physician has recommended that you wear a 48 HOUR holter monitor. Holter monitors are medical devices that record the heart's electrical activity. Doctors most often use these monitors to diagnose arrhythmias. Arrhythmias are problems with the speed or rhythm of the heartbeat. The monitor is a small, portable device. You can wear one while you do your normal daily activities. This is usually used to diagnose what is causing palpitations/syncope (passing out).     Follow-Up:  BASED UPON RELSULTS   Any Other Special Instructions Will Be Listed Below (If Applicable).

## 2016-12-19 ENCOUNTER — Ambulatory Visit (INDEPENDENT_AMBULATORY_CARE_PROVIDER_SITE_OTHER): Payer: Medicare HMO

## 2016-12-19 DIAGNOSIS — I493 Ventricular premature depolarization: Secondary | ICD-10-CM

## 2017-01-04 ENCOUNTER — Ambulatory Visit (INDEPENDENT_AMBULATORY_CARE_PROVIDER_SITE_OTHER): Payer: Medicare HMO | Admitting: Emergency Medicine

## 2017-01-04 ENCOUNTER — Encounter: Payer: Self-pay | Admitting: Emergency Medicine

## 2017-01-04 VITALS — BP 130/70 | HR 43 | Ht 68.0 in | Wt 183.6 lb

## 2017-01-04 DIAGNOSIS — R0609 Other forms of dyspnea: Secondary | ICD-10-CM

## 2017-01-04 DIAGNOSIS — R001 Bradycardia, unspecified: Secondary | ICD-10-CM | POA: Diagnosis not present

## 2017-01-04 MED ORDER — ALBUTEROL SULFATE HFA 108 (90 BASE) MCG/ACT IN AERS: 2.0000 | INHALATION_SPRAY | Freq: Four times a day (QID) | RESPIRATORY_TRACT | 2 refills | Status: DC | PRN

## 2017-01-04 NOTE — Progress Notes (Signed)
Subjective:    Patient ID: Duane Reyes, male    DOB: 12-10-1950, 66 y.o.   MRN: [id number]  HPI  66 year old never smoker with a history of coronary disease, hypertension, prostate cancer, hypothyroidism, CRI. He has been under evaluation for exertional dyspnea. Part of this has included pulmonary function testing on 11/29/16 that I have personally reviewed. These showed evidence for restrictive lung disease, possible mixed obstruction and restriction based on a normal FEV1 to FVC ratio, decreased diffusion capacity that corrects to the normal range when adjusted for alveolar volume.  He had a L heart cath 01/2016 that showed CAD, underwent PTCI. Cardiac CT scan >> by basilar, left greater than right bronchiectatic change with some mild, some stable small nodules (compared with prior CT abd). He has been treated with diltiazem, now changed to amlodipine >> note HR 43 today.   He describes about 2 years of progressive exertional SOB, also fatigue. Sometimes happens at rest and when he tries to speak a lot. He has not been able to be as active as he would like. His weight has increased about 5 lbs over the last few years. No cough, no wheeze, no CP. He tells me that he averages 1-2 episodes of bronchitis annually, gets abx and pred.    Review of Systems  Constitutional: Negative for fever and unexpected weight change.  HENT: Positive for sneezing. Negative for congestion, dental problem, ear pain, nosebleeds, postnasal drip, rhinorrhea, sinus pressure, sore throat and trouble swallowing.   Eyes: Negative for redness and itching.  Respiratory: Positive for cough and shortness of breath. Negative for chest tightness and wheezing.   Cardiovascular: Negative for palpitations and leg swelling.  Gastrointestinal: Negative for nausea and vomiting.  Genitourinary: Negative for dysuria.  Musculoskeletal: Negative for joint swelling.  Skin: Negative for rash.  Neurological: Negative for headaches.    Hematological: Does not bruise/bleed easily.  Psychiatric/Behavioral: Negative for dysphoric mood. The patient is not nervous/anxious.    Past Medical History:  Diagnosis Date  . Arthritis    "left knee" (01/29/2016)  . 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 CAD in native artery    a. Abnl cardiac CT -> LHC 01/2016 s/p 50% mLAD, 25% OM2, 95% RPDA which was treated with overlapping DES.  [name] Chronic lower back pain   . Frequent PVCs    a. 12/2015 - Holter showed SB, NSR, ST, HR 54-108, with frequent PVCs in singles, couplets, and bigemy, with elevated PVC load 24%.  [name] GERD (gastroesophageal reflux disease)   . History of shingles   . Hyperlipidemia   . Hypertension   . Hypothyroidism   . Prostate cancer ([place])   . Tortuosity of aortic arch, -would not pursue Rt radial approach in this patient for future cardiac caths      Family History  Problem Relation Age of Onset  . Heart attack Father   . Heart disease Father      Social History   Social History  . Marital status: Married    Spouse name: N/A  . Number of children: N/A  . Years of education: N/A   Occupational History  . Not on file.   Social History Main Topics  . Smoking status: Never Smoker  . Smokeless tobacco: Never Used  . Alcohol use Yes     Comment: 01/29/2016 "glass of wine q couple months"  . Drug use: No  . Sexual activity: Not Currently   Other Topics Concern  . Not on file  Social History Narrative  . 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   Allergies  Allergen Reactions  . Topamax [Topiramate] Other (See Comments)    blurred vision, tired, ringing in ears, restless, confussion blurred vision, tired, ringing in ears, restless, confussion  . Celebrex [Celecoxib]     unknown  . Crestor [Rosuvastatin Calcium]     myalgias  . Pitavastatin Rash    Per patient had a rash and a cough     Outpatient  Medications Prior to Visit  Medication Sig Dispense Refill  . acetaminophen (TYLENOL) 325 MG tablet Take 2 tablets (650 mg total) by mouth every 4 (four) hours as needed for headache or mild pain.    [name] amLODipine (NORVASC) 5 MG tablet Take 1 tablet (5 mg total) by mouth daily. 90 tablet 3  . aspirin 81 MG chewable tablet Chew 81 mg by mouth daily.    [name] atorvastatin (LIPITOR) 40 MG tablet Take 1 tablet (40 mg total) by mouth daily at 6 PM. 30 tablet 6  . b complex vitamins tablet Take 1 tablet by mouth daily.    . Brinzolamide-Brimonidine (SIMBRINZA) 1-0.2 % SUSP Place 1 drop into the left eye daily.    . clopidogrel (PLAVIX) 75 MG tablet Take 1 tablet (75 mg total) by mouth daily with breakfast. 90 tablet 3  . DUREZOL 0.05 % EMUL Place 1 drop into the left eye daily.  1  . levothyroxine (SYNTHROID, LEVOTHROID) 112 MCG tablet Take 112 mcg by mouth daily before breakfast.    . nepafenac (ILEVRO) 0.3 % ophthalmic suspension Place 1 drop into the left eye daily.    . nitroGLYCERIN (NITROSTAT) 0.4 MG SL tablet Place 0.4 mg under the tongue every 5 (five) minutes as needed for chest pain (x 3 doses).    . pantoprazole (PROTONIX) 40 MG tablet Take 1 tablet (40 mg total) by mouth daily. 90 tablet 3   No facility-administered medications prior to visit.         Objective:   Physical Exam Vitals:   01/04/17 1644  BP: 130/70  Pulse: (!) 43  SpO2: 96%  Weight: 183 lb 9.6 oz (83.3 kg)  Height: 5\' 8"  (1.727 m)   Gen: Pleasant, well-nourished, in no distress,  normal affect  ENT: No lesions,  mouth clear,  oropharynx clear, no postnasal drip  Neck: No JVD, no TMG, no carotid bruits  Lungs: No use of accessory muscles, few bibasilar insp crackles, no wheezes.   Cardiovascular: bradycardic, irregular, ? Early syst M, trace B LE edema  Musculoskeletal: No deformities, no cyanosis or clubbing  Neuro: alert, non focal  Skin: Warm, no lesions or rashes       Assessment & Plan:  DOE  (dyspnea on exertion) Etiology not entirely clear at this time. He was treated with a stent for his coronary artery disease but his dyspnea did not change. Pulmonary function testing consistent with possible mixed disease, principally restriction. He does have crackles on exam, question interstitial lung disease. His CT cardiac CT did show some mild by basilar bronchiectatic change and scar. I believe he needs a dedicated CT chest with high-resolution cuts.   Consider obstructive lung disease. I would like to empirically try him on albuterol when necessary to see if he benefits. If so then he may decide to start scheduled BD;s  Note that his HR on presentation was 43.  Was 75 at his last cards appt. ? Whether he is in bigeminy based on exam. ECG today to assess.   Walking oximetry today to insure no desaturation.  Check CBC and TSH as contributors to dyspnea / fatigue.    Baltazar Apo, MD, PhD 01/04/2017, 5:37 PM [facility] [phone] or if no answer [phone]

## 2017-01-04 NOTE — Assessment & Plan Note (Signed)
Etiology not entirely clear at this time. He was treated with a stent for his coronary artery disease but his dyspnea did not change. Pulmonary function testing consistent with possible mixed disease, principally restriction. He does have crackles on exam, question interstitial lung disease. His CT cardiac CT did show some mild by basilar bronchiectatic change and scar. I believe he needs a dedicated CT chest with high-resolution cuts.   Consider obstructive lung disease. I would like to empirically try him on albuterol when necessary to see if he benefits. If so then he may decide to start scheduled BD;s  Note that his HR on presentation was 43. Was 20 at his last cards appt. ? Whether he is in bigeminy based on exam. ECG today to assess.   Walking oximetry today to insure no desaturation.  Check CBC and TSH as contributors to dyspnea / fatigue.

## 2017-01-04 NOTE — Patient Instructions (Addendum)
We will order lab work We will perform an ECG today.  We will perform a walking oximetry today on room air.  We will perform a CT scan of the chest Follow with Dr Lamonte Sakai next available to review your results.

## 2017-01-04 NOTE — Addendum Note (Signed)
Addended by: Jannette Spanner on: 01/04/2017 05:49 PM   Modules accepted: Orders

## 2017-01-04 NOTE — Addendum Note (Signed)
Addended by: Jannette Spanner on: 01/04/2017 05:45 PM   Modules accepted: Orders

## 2017-01-05 ENCOUNTER — Other Ambulatory Visit (INDEPENDENT_AMBULATORY_CARE_PROVIDER_SITE_OTHER): Payer: Medicare HMO

## 2017-01-05 DIAGNOSIS — R001 Bradycardia, unspecified: Secondary | ICD-10-CM | POA: Diagnosis not present

## 2017-01-05 DIAGNOSIS — R0609 Other forms of dyspnea: Secondary | ICD-10-CM | POA: Diagnosis not present

## 2017-01-05 LAB — CBC WITH DIFFERENTIAL/PLATELET
Basophils Absolute: 0 10*3/uL (ref 0.0–0.1)
Basophils Relative: 0.6 % (ref 0.0–3.0)
EOS PCT: 4 % (ref 0.0–5.0)
Eosinophils Absolute: 0.3 10*3/uL (ref 0.0–0.7)
HEMATOCRIT: 47.7 % (ref 39.0–52.0)
HEMOGLOBIN: 16.2 g/dL (ref 13.0–17.0)
LYMPHS PCT: 19.7 % (ref 12.0–46.0)
Lymphs Abs: 1.3 10*3/uL (ref 0.7–4.0)
MCHC: 33.9 g/dL (ref 30.0–36.0)
MCV: 94.4 fl (ref 78.0–100.0)
MONO ABS: 0.8 10*3/uL (ref 0.1–1.0)
Monocytes Relative: 12.7 % — ABNORMAL HIGH (ref 3.0–12.0)
NEUTROS ABS: 4.1 10*3/uL (ref 1.4–7.7)
Neutrophils Relative %: 63 % (ref 43.0–77.0)
PLATELETS: 181 10*3/uL (ref 150.0–400.0)
RBC: 5.05 Mil/uL (ref 4.22–5.81)
RDW: 14 % (ref 11.5–15.5)
WBC: 6.5 10*3/uL (ref 4.0–10.5)

## 2017-01-05 LAB — TSH: TSH: 0.58 u[IU]/mL (ref 0.35–4.50)

## 2017-01-09 ENCOUNTER — Encounter (INDEPENDENT_AMBULATORY_CARE_PROVIDER_SITE_OTHER): Payer: Medicare HMO | Admitting: Ophthalmology

## 2017-01-09 DIAGNOSIS — H59032 Cystoid macular edema following cataract surgery, left eye: Secondary | ICD-10-CM

## 2017-01-09 DIAGNOSIS — H35372 Puckering of macula, left eye: Secondary | ICD-10-CM | POA: Diagnosis not present

## 2017-01-09 DIAGNOSIS — H43813 Vitreous degeneration, bilateral: Secondary | ICD-10-CM

## 2017-01-09 DIAGNOSIS — H353131 Nonexudative age-related macular degeneration, bilateral, early dry stage: Secondary | ICD-10-CM

## 2017-01-09 DIAGNOSIS — H35033 Hypertensive retinopathy, bilateral: Secondary | ICD-10-CM

## 2017-01-09 DIAGNOSIS — I10 Essential (primary) hypertension: Secondary | ICD-10-CM

## 2017-01-13 ENCOUNTER — Ambulatory Visit (INDEPENDENT_AMBULATORY_CARE_PROVIDER_SITE_OTHER)
Admission: RE | Admit: 2017-01-13 | Discharge: 2017-01-13 | Disposition: A | Discharge status: Home / Self Care | Payer: Medicare HMO | Source: Ambulatory Visit | Attending: Emergency Medicine | Admitting: Emergency Medicine

## 2017-01-13 DIAGNOSIS — R0609 Other forms of dyspnea: Secondary | ICD-10-CM | POA: Diagnosis not present

## 2017-01-13 DIAGNOSIS — R001 Bradycardia, unspecified: Secondary | ICD-10-CM

## 2017-01-25 ENCOUNTER — Other Ambulatory Visit: Payer: Self-pay | Admitting: Cardiology

## 2017-01-25 NOTE — Telephone Encounter (Signed)
Rx has been sent to the pharmacy electronically. ° °

## 2017-02-01 ENCOUNTER — Ambulatory Visit (INDEPENDENT_AMBULATORY_CARE_PROVIDER_SITE_OTHER): Payer: Medicare HMO | Admitting: Cardiology

## 2017-02-01 ENCOUNTER — Encounter: Payer: Self-pay | Admitting: Cardiology

## 2017-02-01 VITALS — BP 138/82 | HR 74 | Ht 68.0 in | Wt 184.8 lb

## 2017-02-01 DIAGNOSIS — I2510 Atherosclerotic heart disease of native coronary artery without angina pectoris: Secondary | ICD-10-CM | POA: Diagnosis not present

## 2017-02-01 DIAGNOSIS — I10 Essential (primary) hypertension: Secondary | ICD-10-CM

## 2017-02-01 DIAGNOSIS — I493 Ventricular premature depolarization: Secondary | ICD-10-CM

## 2017-02-01 MED ORDER — VERAPAMIL HCL ER 180 MG PO TBCR: 180.0000 mg | EXTENDED_RELEASE_TABLET | Freq: Every day | ORAL | 3 refills | Status: DC

## 2017-02-01 NOTE — Progress Notes (Signed)
Electrophysiology Office Note   Date:  02/01/2017   ID:  Duane Reyes, DOB 1951/06/10, MRN [id number]  PCP:  Duane Housekeeper, MD  Cardiologist:  [name] Primary Electrophysiologist:  Duane Meredith Leeds, MD    Chief Complaint  Patient presents with  . Advice Only    PVC's     History of Present Illness: Duane Reyes is a 66 y.o. male who is being seen today for the evaluation of PVCs at the request of Duane Housekeeper, MD. Presenting today for electrophysiology evaluation. He has history of coronary disease with coronary CTA showing significant calcification. Left heart cath 01/2016 showed 50% LAD, 25% OM 2, 95% PDA status post PCI. He has a history of PVCs. Was initially on Cardizem, but discontinued due to severe fatigue and bradycardia. He does have thyroid disease and has been on Synthroid.    Today, he denies symptoms of palpitations, chest pain, orthopnea, PND, lower extremity edema, claudication, dizziness, presyncope, syncope, bleeding, or neurologic sequela. The patient is tolerating medications without difficulties. His main symptoms are of shortness of breath. He says that he has difficulty doing many of his daily activities without getting short of breath. He does not have much in the way of chest pain, but does have some discomfort when he exerts himself. He feels that this is related to his shortness of breath more than coronary artery disease.   Past Medical History:  Diagnosis Date  . Arthritis    "left knee" (01/29/2016)  . 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 CAD in native artery    a. Abnl cardiac CT -> LHC 01/2016 s/p 50% mLAD, 25% OM2, 95% RPDA which was treated with overlapping DES.  [name] Chronic lower back pain   . Frequent PVCs    a. 12/2015 - Holter showed SB, NSR, ST, HR 54-108, with frequent PVCs in singles, couplets, and bigemy, with elevated PVC load 24%.  [name] GERD (gastroesophageal reflux disease)   . History of shingles   . Hyperlipidemia   .  Hypertension   . Hypothyroidism   . Prostate cancer ([place] Reyes)   . Tortuosity of aortic arch, -would not pursue Rt radial approach in this patient for future cardiac caths    Past Surgical History:  Procedure Laterality Date  . BACK SURGERY    . CARDIAC CATHETERIZATION N/A 01/29/2016   Procedure: Right/Left Heart Cath and Coronary Angiography;  Surgeon: Jettie Booze, MD;  Location: [facility];  Service: Cardiovascular;  Laterality: N/A;  . CARDIAC CATHETERIZATION N/A 01/29/2016   Procedure: Coronary Stent Intervention 2.5/16mm Synergy-proximal PDA;  Surgeon: Jettie Booze, MD;  Location: [facility];  Service: Cardiovascular;  Laterality: N/A;  . CATARACT EXTRACTION W/ INTRAOCULAR LENS IMPLANT Left 11/2015  . COLONOSCOPY    . CORONARY ANGIOPLASTY WITH STENT PLACEMENT  01/29/2016   "2 stents"  . CYST EXCISION Right 2005   "near bicep"  . ESOPHAGOGASTRODUODENOSCOPY    . KNEE ARTHROSCOPY Left 2012   "torn meniscus"  . POSTERIOR LUMBAR FUSION  02/2013   "L4-5; hardware in place"  . PROSTATE BIOPSY  2013  . PROSTATE SURGERY  2013   "brachytherapy for prostate radiation"  . TONSILLECTOMY  1950s     Current Outpatient Prescriptions  Medication Sig Dispense Refill  . acetaminophen (TYLENOL) 325 MG tablet Take 2 tablets (650 mg total) by mouth every 4 (four) hours as needed for headache or mild pain.    [name] albuterol (PROVENTIL HFA;VENTOLIN HFA) 108 (90 Base) MCG/ACT  inhaler Inhale 2 puffs into the lungs every 6 (six) hours as needed for wheezing or shortness of breath. 1 Inhaler 2  . amLODipine (NORVASC) 5 MG tablet Take 1 tablet (5 mg total) by mouth daily. 90 tablet 3  . aspirin 81 MG chewable tablet Chew 81 mg by mouth daily.    [name] atorvastatin (LIPITOR) 40 MG tablet Take 1 tablet (40 mg total) by mouth daily at 6 PM. 30 tablet 6  . b complex vitamins tablet Take 1 tablet by mouth daily.    . clopidogrel (PLAVIX) 75 MG tablet TAKE 1 TABLET DAILY WITH BREAKFAST 90 tablet 3   . levothyroxine (SYNTHROID, LEVOTHROID) 112 MCG tablet Take 112 mcg by mouth daily before breakfast.    . Multiple Vitamins-Minerals (PRESERVISION AREDS 2 PO) Take 1 capsule by mouth 2 (two) times daily.    . nitroGLYCERIN (NITROSTAT) 0.4 MG SL tablet Place 0.4 mg under the tongue every 5 (five) minutes as needed for chest pain (x 3 doses).    . pantoprazole (PROTONIX) 40 MG tablet TAKE 1 TABLET (40 MG TOTAL) BY MOUTH DAILY. 90 tablet 3  . verapamil (CALAN-SR) 180 MG CR tablet Take 1 tablet (180 mg total) by mouth at bedtime. 30 tablet 3   No current facility-administered medications for this visit.     Allergies:   Topamax [topiramate]; Celebrex [celecoxib]; Crestor [rosuvastatin calcium]; and Pitavastatin   Social History:  The patient  reports that he has never smoked. He has never used smokeless tobacco. He reports that he drinks alcohol. He reports that he does not use drugs.   Family History:  The patient's family history includes Heart attack in his father; Heart disease in his father.    ROS:  Please see the history of present illness.   Otherwise, review of systems is positive for chest pressure, hearing loss, visual changes, cough, dyspnea on exertion, wheezing.   All other systems are reviewed and negative.    PHYSICAL EXAM: VS:  BP 138/82   Pulse 74   Ht 5\' 8"  (1.727 m)   Wt 184 lb 12.8 oz (83.8 kg)   BMI 28.10 kg/m  , BMI Body mass index is 28.1 kg/m. GEN: Well nourished, well developed, in no acute distress  HEENT: normal  Neck: no JVD, carotid bruits, or masses Cardiac: RRR; no murmurs, rubs, or gallops,no edema  Respiratory:  clear to auscultation bilaterally, normal work of breathing GI: soft, nontender, nondistended, + BS MS: no deformity or atrophy  Skin: warm and dry Neuro:  Strength and sensation are intact Psych: euthymic mood, full affect  EKG:  EKG is not ordered today. Personal review of the ekg ordered shows sinus rhythm, old IMI, ventricular  bigeminy  Recent Labs: 10/26/2016: ALT 32 01/05/2017: Hemoglobin 16.2; Platelets 181.0; TSH 0.58    Lipid Panel     Component Value Date/Time   CHOL 149 10/26/2016 0927   TRIG 116 10/26/2016 0927   HDL 53 10/26/2016 0927   CHOLHDL 2.8 10/26/2016 0927   CHOLHDL 2.9 05/23/2016 0949   VLDL 18 05/23/2016 0949   LDLCALC 73 10/26/2016 0927     Wt Readings from Last 3 Encounters:  02/01/17 184 lb 12.8 oz (83.8 kg)  01/04/17 183 lb 9.6 oz (83.3 kg)  12/13/16 181 lb 1.9 oz (82.2 kg)      Other studies Reviewed: Additional studies/ records that were reviewed today include: TTE 2016, Cath 01/29/16, Holter 12/25/16 - personally reviewed  Review of the above records today demonstrates:  -  Left ventricle: The cavity size was normal. Wall thickness was   normal. Systolic function was normal. The estimated ejection   fraction was in the range of 55% to 60%. Wall motion was normal;   there were no regional wall motion abnormalities.    Mid LAD lesion, 50% stenosed.  2nd Mrg lesion, 25% stenosed.  The left ventricular systolic function is normal.  RPDA lesion, 95% stenosed. Post intervention with overlapping drug eluting stents postdilated to > 3 mm, there is a 0% residual stenosis.  Due to severe tortuosity in his aortic arch, would not pursue right radial approach in this patient in the future if catheterization was needed.  Normal pulmonary artery pressures.     Normal sinus rhythm with average heart rate 83bpm. The heart rate ranged from 55-116bpm.  Frequent PVCs, bigeminal PVCs, ventricular couplets and salvos.  Occasional PACs  PVC load was 46%   ASSESSMENT AND PLAN:  1.  PVCs: Frequent PVCs with the burden of 46%. On exam, it does not appear that he is having PVCs at this time. Fortunately his ejection fraction has not decreased in that amount of time. He would likely benefit from suppression at this PVCs. We discussed both medical management and ablation strategies.  Unfortunately he does have thyroid disease, and amiodarone would not be a good option. His only antiarrhythmic option at this time would be sotalol. His PVCs have been suppressed by diltiazem in the past. Risks and benefits of ablation were discussed. Risks include bleeding, tamponade, heart block, stroke. He would like to think about these options. In the interim, we'll try him on verapamil to see if this Duane help decrease his PVC burden.  2. Coronary artery disease: s/p RCA PCI.  On Plavix and aspirin. No current chest pain. Continue current management  3. Hypertension: Well-controlled today. No changes at this time.  4. Hypothyroidism: on synthroid, Duane attempt to avoid amiodarone  Current medicines are reviewed at length with the patient today.   The patient does not have concerns regarding his medicines.  The following changes were made today:  Add verapamil  Labs/ tests ordered today include:  No orders of the defined types were placed in this encounter.    Disposition:   FU with Duane Camnitz 3 months  Signed, Duane Meredith Leeds, MD  02/01/2017 10:10 AM     [facility] [address] [place] [address] [phone] (office) [phone] (fax)

## 2017-02-01 NOTE — Patient Instructions (Signed)
Medication Instructions:    Your physician has recommended you make the following change in your medication:  1) START Verapamil 180 mg once daily  - If you need a refill on your cardiac medications before your next appointment, please call your pharmacy.   Labwork:  None ordered  Testing/Procedures:  None ordered  Follow-Up:  Your physician recommends that you schedule a follow-up appointment in: 3 months with [name].  Thank you for choosing [facility]!!   Trinidad Curet, RN [phone]  Any Other Special Instructions Will Be Listed Below (If Applicable).  Sotalol tablets (Betapace AF) What is this medicine? SOTALOL (SOE ta lole) is a beta-blocker. Beta-blockers reduce the workload on the heart and help it to beat more regularly. This medicine is used to treat patients with an atrial heart arrhythmia such as atrial fibrillation. This medicine can help your heart return to and maintain a normal rhythm. This medicine may be used for other purposes; ask your health care provider or pharmacist if you have questions. COMMON BRAND NAME(S): BETAPACE AF What should I tell my health care provider before I take this medicine? They need to know if you have any of these conditions: -diabetes -heart or vessel disease like slow heart rate, worsening heart failure, heart block, sick sinus syndrome or Raynaud's disease -history of low levels of potassium or magnesium -kidney disease -liver disease -lung or breathing disease, like asthma or emphysema -pheochromocytoma -recent heart attack -thyroid disease -an unusual or allergic reaction to sotalol, other beta-blockers, medicines, foods, dyes, or preservatives -pregnant or trying to get pregnant -breast-feeding How should I use this medicine? Take this medicine by mouth with a glass of water. Follow the directions on the prescription label. Take your doses at regular intervals. Do not take your medicine more often than  directed. Do not stop taking this medicine suddenly. This could lead to serious heart-related effects. Talk to your pediatrician regarding the use of this medicine in children. Special care may be needed. While this medicine may be used in children for selected conditions precautions do apply. Overdosage: If you think you have taken too much of this medicine contact a poison control center or emergency room at once. NOTE: This medicine is only for you. Do not share this medicine with others. What if I miss a dose? If you miss a dose, take it as soon as you can. If it is almost time for your next dose, take only that dose. Do not take double or extra doses. What may interact with this medicine? Do not take this medicine with any of the following medications: -amoxapine -arsenic trioxide -certain antibiotics like gatifloxacin, grepafloxacin, levofloxacin, moxifloxacin, sparfloxacin, telithromycin -cisapride -droperidol -haloperidol -hawthorn -maprotiline -medicines for malaria like chloroquine and halofantrine -medicines to control heart rhythm -methadone -pentamidine -phenothiazines like prochlorperazine, perphenazine, thioridazine, and others -pimozide -ranolazine -tricyclic antidepressants like amitriptyline, imipramine, nortriptyline, and others -vardenafil -ziprasidone This medicine may also interact with the following medications: -antacids -certain antibiotics such as clarithromycin and erythromycin -clonidine -digoxin -medicines for angina or high blood pressure -medicines for colds and breathing difficulties -medicines for diabetes -other beta-blockers like atenolol, metoprolol, propranolol and others This list may not describe all possible interactions. Give your health care provider a list of all the medicines, herbs, non-prescription drugs, or dietary supplements you use. Also tell them if you smoke, drink alcohol, or use illegal drugs. Some items may interact with your  medicine. What should I watch for while using this medicine? You will be started  on this medicine in a specialized facility for the first two or more days of treatment. Visit your doctor or health care professional for regular checks on your progress. Check your heart rate and blood pressure regularly while you are taking this medicine. Ask your doctor or health care professional what your heart rate and blood pressure should be, and when you should contact him or her. Your doctor or health care professional also may schedule regular blood tests and electrocardiograms to check your progress. Because your condition and the use of this medicine carry some risk, it is a good idea to carry an identification card, necklace or bracelet with details of your condition, medications, and doctor or health care professional. Dennis Bast may get drowsy or dizzy. Do not drive, use machinery, or do anything that needs mental alertness until you know how this drug affects you. Do not stand or sit up quickly, especially if you are an older patient. This reduces the risk of dizzy or fainting spells. Alcohol can make you more drowsy and dizzy. Avoid alcoholic drinks. Do not treat yourself for coughs, colds, or pain while you are taking this medicine without asking your doctor or health care professional for advice. Some ingredients may increase your blood pressure. If you are going to have surgery, tell your doctor or health care professional that you are taking this medicine. What side effects may I notice from receiving this medicine? Side effects that you should report to your doctor or health care professional as soon as possible: -allergic reactions like skin rash, itching or hives, swelling of the face, lips, or tongue -chest pain -cold, tingling, or numb hands or feet -confusion -diarrhea -difficulty breathing, wheezing -irregular heartbeat -muscle aches and pains -slow heart rate -sweating -swollen legs or  ankles -tremor, shakes -vomiting Side effects that usually do not require medical attention (report to your doctor or health care professional if they continue or are bothersome): -change in sex drive or performance -mental depression -nausea -weakness or tiredness This list may not describe all possible side effects. Call your doctor for medical advice about side effects. You may report side effects to FDA at 1-800-FDA-1088. Where should I keep my medicine? Keep out of the reach of children. Store at room temperature between 15 and 30 degrees C (59 and 86 degrees F). Throw away any unused medicine after the expiration date. NOTE: This sheet is a summary. It may not cover all possible information. If you have questions about this medicine, talk to your doctor, pharmacist, or health care provider.  2018 Elsevier/Gold Standard (2013-04-16 15:17:49)    Cardiac Ablation Cardiac ablation is a procedure to disable (ablate) a small amount of heart tissue in very specific places. The heart has many electrical connections. Sometimes these connections are abnormal and can cause the heart to beat very fast or irregularly. Ablating some of the problem areas can improve the heart rhythm or return it to normal. Ablation may be done for people who:  Have Wolff-Parkinson-White syndrome.  Have fast heart rhythms (tachycardia).  Have taken medicines for an abnormal heart rhythm (arrhythmia) that were not effective or caused side effects.  Have a high-risk heartbeat that may be life-threatening.  During the procedure, a small incision is made in the neck or the groin, and a long, thin, flexible tube (catheter) is inserted into the incision and moved to the heart. Small devices (electrodes) on the tip of the catheter will send out electrical currents. A type of X-ray (fluoroscopy) will be  used to help guide the catheter and to provide images of the heart. Tell a health care provider about:  Any allergies  you have.  All medicines you are taking, including vitamins, herbs, eye drops, creams, and over-the-counter medicines.  Any problems you or family members have had with anesthetic medicines.  Any blood disorders you have.  Any surgeries you have had.  Any medical conditions you have, such as kidney failure.  Whether you are pregnant or may be pregnant. What are the risks? Generally, this is a safe procedure. However, problems may occur, including:  Infection.  Bruising and bleeding at the catheter insertion site.  Bleeding into the chest, especially into the sac that surrounds the heart. This is a serious complication.  Stroke or blood clots.  Damage to other structures or organs.  Allergic reaction to medicines or dyes.  Need for a permanent pacemaker if the normal electrical system is damaged. A pacemaker is a small computer that sends electrical signals to the heart and helps your heart beat normally.  The procedure not being fully effective. This may not be recognized until months later. Repeat ablation procedures are sometimes required.  What happens before the procedure?  Follow instructions from your health care provider about eating or drinking restrictions.  Ask your health care provider about: ? Changing or stopping your regular medicines. This is especially important if you are taking diabetes medicines or blood thinners. ? Taking medicines such as aspirin and ibuprofen. These medicines can thin your blood. Do not take these medicines before your procedure if your health care provider instructs you not to.  Plan to have someone take you home from the hospital or clinic.  If you will be going home right after the procedure, plan to have someone with you for 24 hours. What happens during the procedure?  To lower your risk of infection: ? Your health care team will wash or sanitize their hands. ? Your skin will be washed with soap. ? Hair may be removed from  the incision area.  An IV tube will be inserted into one of your veins.  You will be given a medicine to help you relax (sedative).  The skin on your neck or groin will be numbed.  An incision will be made in your neck or your groin.  A needle will be inserted through the incision and into a large vein in your neck or groin.  A catheter will be inserted into the needle and moved to your heart.  Dye may be injected through the catheter to help your surgeon see the area of the heart that needs treatment.  Electrical currents will be sent from the catheter to ablate heart tissue in desired areas. There are three types of energy that may be used to ablate heart tissue: ? Heat (radiofrequency energy). ? Laser energy. ? Extreme cold (cryoablation).  When the necessary tissue has been ablated, the catheter will be removed.  Pressure will be held on the catheter insertion area to prevent excessive bleeding.  A bandage (dressing) will be placed over the catheter insertion area. The procedure may vary among health care providers and hospitals. What happens after the procedure?  Your blood pressure, heart rate, breathing rate, and blood oxygen level will be monitored until the medicines you were given have worn off.  Your catheter insertion area will be monitored for bleeding. You will need to lie still for a few hours to ensure that you do not bleed from the catheter  insertion area.  Do not drive for 24 hours or as long as directed by your health care provider. Summary  Cardiac ablation is a procedure to disable (ablate) a small amount of heart tissue in very specific places. Ablating some of the problem areas can improve the heart rhythm or return it to normal.  During the procedure, electrical currents will be sent from the catheter to ablate heart tissue in desired areas. This information is not intended to replace advice given to you by your health care provider. Make sure you  discuss any questions you have with your health care provider. Document Released: 01/01/2009 Document Revised: 07/04/2016 Document Reviewed: 07/04/2016 Elsevier Interactive Patient Education  [employer].

## 2017-02-21 ENCOUNTER — Telehealth: Payer: Self-pay | Admitting: Emergency Medicine

## 2017-02-21 NOTE — Telephone Encounter (Signed)
Spoke with patient. He stated that he labs drawn and a CT chest done last month but has not heard anything about results. Reviewed patient's chart, he had a TSH and CBC w/Diff on 01/05/17. He also had a CT Chest W/O contrast on 01/13/17.   RB, can you please review patient's test results and advise on his results? Thanks.

## 2017-02-22 NOTE — Telephone Encounter (Signed)
Please let him know that his blood counts are normal Also please let him know that his CT scan of the chest shows some very mild evidence for some scarring at the bottoms of both lungs, probably due to prior inflammation or infection. This has not changed compared with his prior CT scan.

## 2017-02-23 NOTE — Telephone Encounter (Signed)
Spoke with pt. He is aware of results. Nothing further was needed. 

## 2017-03-02 ENCOUNTER — Other Ambulatory Visit: Payer: Self-pay

## 2017-03-02 NOTE — Telephone Encounter (Signed)
clopidogrel (PLAVIX) 75 MG tablet  Medication  Date: 01/25/2017 Department: [address] Office Ordering/Authorizing: Evans Lance, MD  Order Providers   Prescribing Provider Encounter Provider  Evans Lance, MD Isaiah Serge, NP  Medication Detail    Disp Refills Start End   clopidogrel (PLAVIX) 75 MG tablet 90 tablet 3 01/25/2017    Sig: TAKE 1 TABLET DAILY WITH BREAKFAST   E-Prescribing Status: Receipt confirmed by pharmacy (01/25/2017 9:24 AM EDT)   Pharmacy   [id number] - [place], [place] - [address]   pantoprazole (PROTONIX) 40 MG tablet  Medication  Date: 01/25/2017 Department: [address] Office Ordering/Authorizing: Evans Lance, MD  Order Providers   Prescribing Provider Encounter Provider  Evans Lance, MD Isaiah Serge, NP  Medication Detail    Disp Refills Start End   pantoprazole (PROTONIX) 40 MG tablet 90 tablet 3 01/25/2017    Sig - Route: TAKE 1 TABLET (40 MG TOTAL) BY MOUTH DAILY. - Oral   E-Prescribing Status: Receipt confirmed by pharmacy (01/25/2017 9:24 AM EDT)   Pharmacy   [id number] - [place], [place] - [address]   Current RX at local Pharmacy. Please contact and have RX transferred Telephone Fax  [phone]

## 2017-03-03 ENCOUNTER — Other Ambulatory Visit: Payer: Self-pay | Admitting: *Deleted

## 2017-03-03 NOTE — Telephone Encounter (Signed)
Just wanted to be sure okay to refill for ninety as he was recently started on this and was given #30 with 3 refills which will last until his 3 month follow up appointment. Please advise. Thanks, MI

## 2017-03-06 MED ORDER — VERAPAMIL HCL ER 180 MG PO TBCR: 180.0000 mg | EXTENDED_RELEASE_TABLET | Freq: Every day | ORAL | 3 refills | Status: DC

## 2017-03-06 NOTE — Telephone Encounter (Signed)
OK to fill for 90 days if that is patient's preference.

## 2017-03-07 MED ORDER — CLOPIDOGREL BISULFATE 75 MG PO TABS: 75.0000 mg | ORAL_TABLET | Freq: Every day | ORAL | 3 refills | Status: DC

## 2017-03-07 MED ORDER — PANTOPRAZOLE SODIUM 40 MG PO TBEC: 40.0000 mg | DELAYED_RELEASE_TABLET | Freq: Every day | ORAL | 3 refills | Status: DC

## 2017-03-07 NOTE — Addendum Note (Signed)
Addended by: Derl Barrow on: 03/07/2017 09:11 AM   Modules accepted: Orders

## 2017-03-08 ENCOUNTER — Other Ambulatory Visit: Payer: Self-pay | Admitting: Physician Assistant

## 2017-04-12 ENCOUNTER — Encounter: Payer: Self-pay | Admitting: Cardiology

## 2017-05-03 ENCOUNTER — Ambulatory Visit (INDEPENDENT_AMBULATORY_CARE_PROVIDER_SITE_OTHER): Payer: Medicare HMO | Admitting: Cardiology

## 2017-05-03 ENCOUNTER — Encounter (INDEPENDENT_AMBULATORY_CARE_PROVIDER_SITE_OTHER): Payer: Self-pay

## 2017-05-03 ENCOUNTER — Encounter: Payer: Self-pay | Admitting: Cardiology

## 2017-05-03 VITALS — BP 110/72 | HR 81 | Ht 68.0 in | Wt 183.6 lb

## 2017-05-03 DIAGNOSIS — I2510 Atherosclerotic heart disease of native coronary artery without angina pectoris: Secondary | ICD-10-CM | POA: Diagnosis not present

## 2017-05-03 DIAGNOSIS — I493 Ventricular premature depolarization: Secondary | ICD-10-CM

## 2017-05-03 MED ORDER — VERAPAMIL HCL ER 240 MG PO TBCR: 240.0000 mg | EXTENDED_RELEASE_TABLET | Freq: Every day | ORAL | 3 refills | Status: DC

## 2017-05-03 NOTE — Patient Instructions (Signed)
Medication Instructions:  Your physician has recommended you make the following change in your medication:  1. STOP Amlodipine (Norvasc) 2. INCREASE Verapamil to 240 mg once daily  If you need a refill on your cardiac medications before your next appointment, please call your pharmacy.   Labwork: None ordered  Testing/Procedures: Your physician has recommended that you wear a 48 holter monitor in one month. Holter monitors are medical devices that record the heart's electrical activity. Doctors most often use these monitors to diagnose arrhythmias. Arrhythmias are problems with the speed or rhythm of the heartbeat. The monitor is a small, portable device. You can wear one while you do your normal daily activities. This is usually used to diagnose what is causing palpitations/syncope (passing out).    Follow-Up: Your physician recommends that you schedule a follow-up appointment in: 3 months with [name].   Thank you for choosing [facility]!!   Trinidad Curet, RN [phone]

## 2017-05-03 NOTE — Progress Notes (Signed)
Electrophysiology Office Note   Date:  05/03/2017   ID:  CHRISTIANJAMES SOULE, DOB 1951/01/03, MRN [id number]  PCP:  [name], MD  Cardiologist:  [name] Primary Electrophysiologist:  Rashon Rezek Meredith Leeds, MD    Chief Complaint  Patient presents with  . Follow-up    PVC's     History of Present Illness: Duane Reyes is a 66 y.o. male who is being seen today for the evaluation of PVCs at the request of [name], MD. Presenting today for electrophysiology evaluation. He has history of coronary disease with coronary CTA showing significant calcification. Left heart cath 01/2016 showed 50% LAD, 25% OM 2, 95% PDA status post PCI. He has a history of PVCs. Was initially on Cardizem, but discontinued due to severe fatigue and bradycardia. He does have thyroid disease and has been on Synthroid. He wore a Holter monitor which showed 46% PVCs.  Today, denies symptoms of palpitations, chest pain, shortness of breath, orthopnea, PND, lower extremity edema, claudication, dizziness, presyncope, syncope, bleeding, or neurologic sequela. The patient is tolerating medications without difficulties.     Past Medical History:  Diagnosis Date  . Arthritis    "left knee" (01/29/2016)  . 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 CAD in native artery    a. Abnl cardiac CT -> LHC 01/2016 s/p 50% mLAD, 25% OM2, 95% RPDA which was treated with overlapping DES.  [name] Chronic lower back pain   . Frequent PVCs    a. 12/2015 - Holter showed SB, NSR, ST, HR 54-108, with frequent PVCs in singles, couplets, and bigemy, with elevated PVC load 24%.  [name] GERD (gastroesophageal reflux disease)   . History of shingles   . Hyperlipidemia   . Hypertension   . Hypothyroidism   . Prostate cancer ([place])   . Tortuosity of aortic arch, -would not pursue Rt radial approach in this patient for future cardiac caths    Past Surgical History:  Procedure Laterality Date  . BACK SURGERY    . CARDIAC CATHETERIZATION N/A  01/29/2016   Procedure: Right/Left Heart Cath and Coronary Angiography;  Surgeon: Jettie Booze, MD;  Location: [facility];  Service: Cardiovascular;  Laterality: N/A;  . CARDIAC CATHETERIZATION N/A 01/29/2016   Procedure: Coronary Stent Intervention 2.5/16mm Synergy-proximal PDA;  Surgeon: Jettie Booze, MD;  Location: [facility];  Service: Cardiovascular;  Laterality: N/A;  . CATARACT EXTRACTION W/ INTRAOCULAR LENS IMPLANT Left 11/2015  . COLONOSCOPY    . CORONARY ANGIOPLASTY WITH STENT PLACEMENT  01/29/2016   "2 stents"  . CYST EXCISION Right 2005   "near bicep"  . ESOPHAGOGASTRODUODENOSCOPY    . KNEE ARTHROSCOPY Left 2012   "torn meniscus"  . POSTERIOR LUMBAR FUSION  02/2013   "L4-5; hardware in place"  . PROSTATE BIOPSY  2013  . PROSTATE SURGERY  2013   "brachytherapy for prostate radiation"  . TONSILLECTOMY  1950s     Current Outpatient Prescriptions  Medication Sig Dispense Refill  . acetaminophen (TYLENOL) 325 MG tablet Take 2 tablets (650 mg total) by mouth every 4 (four) hours as needed for headache or mild pain.    [name] albuterol (PROVENTIL HFA;VENTOLIN HFA) 108 (90 Base) MCG/ACT inhaler Inhale 2 puffs into the lungs every 6 (six) hours as needed for wheezing or shortness of breath. 1 Inhaler 2  . aspirin 81 MG chewable tablet Chew 81 mg by mouth daily.    [name] atorvastatin (LIPITOR) 40 MG tablet Take 1 tablet (40 mg total) by  mouth daily at 6 PM. 30 tablet 6  . b complex vitamins tablet Take 1 tablet by mouth daily.    . clopidogrel (PLAVIX) 75 MG tablet Take 1 tablet (75 mg total) by mouth daily with breakfast. 90 tablet 3  . levothyroxine (SYNTHROID, LEVOTHROID) 112 MCG tablet Take 112 mcg by mouth daily before breakfast.    . Multiple Vitamins-Minerals (PRESERVISION AREDS 2 PO) Take 1 capsule by mouth 2 (two) times daily.    . nitroGLYCERIN (NITROSTAT) 0.4 MG SL tablet Place 0.4 mg under the tongue every 5 (five) minutes as needed for chest pain (x 3  doses).    . pantoprazole (PROTONIX) 40 MG tablet Take 1 tablet (40 mg total) by mouth daily. 90 tablet 3  . verapamil (CALAN-SR) 180 MG CR tablet Take 1 tablet (180 mg total) by mouth at bedtime. 90 tablet 3   No current facility-administered medications for this visit.     Allergies:   Topamax [topiramate]; Celebrex [celecoxib]; Crestor [rosuvastatin calcium]; and Pitavastatin   Social History:  The patient  reports that he has never smoked. He has never used smokeless tobacco. He reports that he drinks alcohol. He reports that he does not use drugs.   Family History:  The patient's family history includes Heart attack in his father; Heart disease in his father.    ROS:  Please see the history of present illness.   Otherwise, review of systems is positive for Leg swelling, hearing loss, visual changes, dyspnea on exertion, easy bruising.   All other systems are reviewed and negative.   PHYSICAL EXAM: VS:  BP 110/72   Pulse 81   Ht 5\' 8"  (1.727 m)   Wt 183 lb 9.6 oz (83.3 kg)   BMI 27.92 kg/m  , BMI Body mass index is 27.92 kg/m. GEN: Well nourished, well developed, in no acute distress  HEENT: normal  Neck: no JVD, carotid bruits, or masses Cardiac: iRRR; no murmurs, rubs, or gallops,no edema  Respiratory:  clear to auscultation bilaterally, normal work of breathing GI: soft, nontender, nondistended, + BS MS: no deformity or atrophy  Skin: warm and dry Neuro:  Strength and sensation are intact Psych: euthymic mood, full affect  EKG:  EKG is ordered today. 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)  Recent Labs: 10/26/2016: ALT 32 01/05/2017: Hemoglobin 16.2; Platelets 181.0; TSH 0.58    Lipid Panel     Component Value Date/Time   CHOL 149 10/26/2016 0927   TRIG 116 10/26/2016 0927   HDL 53 10/26/2016 0927   CHOLHDL 2.8 10/26/2016 0927   CHOLHDL 2.9 05/23/2016 0949   VLDL  18 05/23/2016 0949   LDLCALC 73 10/26/2016 0927     Wt Readings from Last 3 Encounters:  05/03/17 183 lb 9.6 oz (83.3 kg)  02/01/17 184 lb 12.8 oz (83.8 kg)  01/04/17 183 lb 9.6 oz (83.3 kg)      Other studies Reviewed: Additional studies/ records that were reviewed today include: TTE 2016, Cath 01/29/16, Holter 12/25/16 - personally reviewed  Review of the above records today demonstrates:  - Left ventricle: The cavity size was normal. Wall thickness was   normal. Systolic function was normal. The estimated ejection   fraction was in the range of 55% to 60%. Wall motion was normal;   there were no regional wall motion abnormalities.    Mid LAD lesion, 50% stenosed.  2nd Mrg lesion, 25% stenosed.  The  left ventricular systolic function is normal.  RPDA lesion, 95% stenosed. Post intervention with overlapping drug eluting stents postdilated to > 3 mm, there is a 0% residual stenosis.  Due to severe tortuosity in his aortic arch, would not pursue right radial approach in this patient in the future if catheterization was needed.  Normal pulmonary artery pressures.     Normal sinus rhythm with average heart rate 83bpm. The heart rate ranged from 55-116bpm.  Frequent PVCs, bigeminal PVCs, ventricular couplets and salvos.  Occasional PACs  PVC load was 46%   ASSESSMENT AND PLAN:  1.  PVCs: He is continuing to have a high burden of PVCs. He is in ventricular trigeminy on his EKG and has quite a few on auscultation by exam. I am concerned that medical management at this time Dixon Luczak not help with his PVC burden. That being said, he does wish to continue with medical management. We'll increase his verapamil and get a Holter monitor in one month.  2. Coronary artery disease: Status post RCA PCI. No current chest pain. Continue aspirin and Plavix.  3. Hypertension: Well-controlled today. He is having lower extremity edema that he feels is due to his amlodipine. We'll plan to stop  today.  4. Hypothyroidism: Currently on Synthroid. We'll attempt to avoid amiodarone.  Current medicines are reviewed at length with the patient today.   The patient does not have concerns regarding his medicines.  The following changes were made today:  Stop norvasc, increase verapamil  Labs/ tests ordered today include:  No orders of the defined types were placed in this encounter.    Disposition:   FU with Ronald Londo 3 months  Signed, Shahrukh Pasch Meredith Leeds, MD  05/03/2017 10:34 AM     [facility] [address] [place] [address] [phone] (office) [phone] (fax)

## 2017-06-02 ENCOUNTER — Ambulatory Visit (INDEPENDENT_AMBULATORY_CARE_PROVIDER_SITE_OTHER): Payer: Medicare HMO

## 2017-06-02 ENCOUNTER — Encounter (INDEPENDENT_AMBULATORY_CARE_PROVIDER_SITE_OTHER): Payer: Self-pay

## 2017-06-02 DIAGNOSIS — I493 Ventricular premature depolarization: Secondary | ICD-10-CM

## 2017-06-19 ENCOUNTER — Telehealth: Payer: Self-pay | Admitting: Cardiology

## 2017-06-19 NOTE — Telephone Encounter (Signed)
Mr.Upham is calling about the results from his heart monitor , where it showed some PVC'S . Please call

## 2017-06-27 NOTE — Telephone Encounter (Signed)
Discussed suggestions [name] mentioned for his PVCs - Amiodarone/Sotalol/Ablation. D/t thyroid disease -- Amiodarone not an option D/t Sotalol and possible SE/high risk/hospitalization he is not very interested in this medication either. Pt is "leaning towards ablation the most". Moved follow up appt up to 11/7 to discuss further with [name]. Patient verbalized understanding and agreeable to plan.

## 2017-07-05 ENCOUNTER — Encounter: Payer: Self-pay | Admitting: Cardiology

## 2017-07-05 ENCOUNTER — Ambulatory Visit: Payer: Medicare HMO | Admitting: Cardiology

## 2017-07-05 VITALS — BP 138/72 | HR 52 | Ht 68.0 in | Wt 185.4 lb

## 2017-07-05 DIAGNOSIS — I493 Ventricular premature depolarization: Secondary | ICD-10-CM

## 2017-07-05 DIAGNOSIS — I10 Essential (primary) hypertension: Secondary | ICD-10-CM

## 2017-07-05 DIAGNOSIS — I25118 Atherosclerotic heart disease of native coronary artery with other forms of angina pectoris: Secondary | ICD-10-CM | POA: Diagnosis not present

## 2017-07-05 MED ORDER — MEXILETINE HCL 150 MG PO CAPS: 150.0000 mg | ORAL_CAPSULE | Freq: Two times a day (BID) | ORAL | 3 refills | Status: DC

## 2017-07-05 NOTE — Patient Instructions (Addendum)
Medication Instructions:  Your physician has recommended you make the following change in your medication:   1.) Mexiletine 150 mg twice per day  -- If you need a refill on your cardiac medications before your next appointment, please call your pharmacy. --  Labwork: None ordered  Testing/Procedures:      Follow-Up:  EKG (nurse visit) 11/19 9:00 am  Your physician wants you to follow-up in: 3 months with [name].     Thank you for choosing [facility]!!   [phone]  Any Other Special Instructions Will Be Listed Below (If Applicable).

## 2017-07-05 NOTE — Progress Notes (Signed)
Electrophysiology Office Note   Date:  07/05/2017   ID:  [name], DOB Feb 24, 1951, MRN [id number]  PCP:  [name], MD  Cardiologist:  [name] Primary Electrophysiologist:  [name], MD    Chief Complaint  Patient presents with  . Follow-up    PVC's     History of Present Illness: Duane Reyes is a 66 y.o. male who is being seen today for the evaluation of PVCs at the request of [name], MD. Presenting today for electrophysiology evaluation. He has history of coronary disease with coronary CTA showing significant calcification. Left heart cath 01/2016 showed 50% LAD, 25% OM 2, 95% PDA status post PCI. He has a history of PVCs. Was initially on Cardizem, but discontinued due to severe fatigue and bradycardia. He does have thyroid disease and has been on Synthroid. He wore a Holter monitor which showed 46% PVCs.  Today, denies symptoms of chest pain, shortness of breath, orthopnea, PND, lower extremity edema, claudication, dizziness, presyncope, syncope, bleeding, or neurologic sequela. The patient is tolerating medications without difficulties.  He continues to have episodic palpitations that occur most days.  His heart rates have been down in the 40s at times on prior visits.  He says that his main feeling is of jitteriness.   Past Medical History:  Diagnosis Date  . Arthritis    "left knee" (01/29/2016)  . 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 CAD in native artery    a. Abnl cardiac CT -> LHC 01/2016 s/p 50% mLAD, 25% OM2, 95% RPDA which was treated with overlapping DES.  [name] Chronic lower back pain   . Frequent PVCs    a. 12/2015 - Holter showed SB, NSR, ST, HR 54-108, with frequent PVCs in singles, couplets, and bigemy, with elevated PVC load 24%.  [name] GERD (gastroesophageal reflux disease)   . History of shingles   . Hyperlipidemia   . Hypertension   . Hypothyroidism   . Prostate cancer ([place])   . Tortuosity of aortic arch, -would not pursue Rt  radial approach in this patient for future cardiac caths    Past Surgical History:  Procedure Laterality Date  . BACK SURGERY    . CATARACT EXTRACTION W/ INTRAOCULAR LENS IMPLANT Left 11/2015  . COLONOSCOPY    . CORONARY ANGIOPLASTY WITH STENT PLACEMENT  01/29/2016   "2 stents"  . CYST EXCISION Right 2005   "near bicep"  . ESOPHAGOGASTRODUODENOSCOPY    . KNEE ARTHROSCOPY Left 2012   "torn meniscus"  . POSTERIOR LUMBAR FUSION  02/2013   "L4-5; hardware in place"  . PROSTATE BIOPSY  2013  . PROSTATE SURGERY  2013   "brachytherapy for prostate radiation"  . TONSILLECTOMY  1950s     Current Outpatient Medications  Medication Sig Dispense Refill  . acetaminophen (TYLENOL) 325 MG tablet Take 2 tablets (650 mg total) by mouth every 4 (four) hours as needed for headache or mild pain.    [name] aspirin 81 MG chewable tablet Chew 81 mg by mouth daily.    [name] atorvastatin (LIPITOR) 40 MG tablet Take 1 tablet (40 mg total) by mouth daily at 6 PM. 30 tablet 6  . b complex vitamins tablet Take 1 tablet by mouth daily.    . clopidogrel (PLAVIX) 75 MG tablet Take 1 tablet (75 mg total) by mouth daily with breakfast. 90 tablet 3  . levothyroxine (SYNTHROID, LEVOTHROID) 112 MCG tablet Take 112 mcg by mouth daily before breakfast.    . Multiple Vitamins-Minerals (  PRESERVISION AREDS 2 PO) Take 1 capsule by mouth 2 (two) times daily.    . nitroGLYCERIN (NITROSTAT) 0.4 MG SL tablet Place 0.4 mg under the tongue every 5 (five) minutes as needed for chest pain (x 3 doses).    . pantoprazole (PROTONIX) 40 MG tablet Take 1 tablet (40 mg total) by mouth daily. 90 tablet 3  . verapamil (CALAN-SR) 240 MG CR tablet Take 1 tablet (240 mg total) by mouth at bedtime. 30 tablet 3   No current facility-administered medications for this visit.     Allergies:   Topamax [topiramate]; Celebrex [celecoxib]; Crestor [rosuvastatin calcium]; and Pitavastatin   Social History:  The patient  reports that  has never smoked. he has  never used smokeless tobacco. He reports that he drinks alcohol. He reports that he does not use drugs.   Family History:  The patient's family history includes Heart attack in his father; Heart disease in his father.    ROS:  Please see the history of present illness.   Otherwise, review of systems is positive for chest pressure, palpitations, hearing loss, dyspnea on exertion, snoring.   All other systems are reviewed and negative.   PHYSICAL EXAM: VS:  BP 138/72   Pulse (!) 52   Ht 5\' 8"  (1.727 m)   Wt 185 lb 6.4 oz (84.1 kg)   SpO2 98%   BMI 28.19 kg/m  , BMI Body mass index is 28.19 kg/m. GEN: Well nourished, well developed, in no acute distress  HEENT: normal  Neck: no JVD, carotid bruits, or masses Cardiac: iRRR; no murmurs, rubs, or gallops,no edema  Respiratory:  clear to auscultation bilaterally, normal work of breathing GI: soft, nontender, nondistended, + BS MS: no deformity or atrophy  Skin: warm and dry Neuro:  Strength and sensation are intact Psych: euthymic mood, full affect  EKG:  EKG is not ordered today. Personal review of the ekg ordered 05/03/17 shows sinus rhythm, frequent PVCs, Rate 81   Recent Labs: 10/26/2016: ALT 32 01/05/2017: Hemoglobin 16.2; Platelets 181.0; TSH 0.58    Lipid Panel     Component Value Date/Time   CHOL 149 10/26/2016 0927   TRIG 116 10/26/2016 0927   HDL 53 10/26/2016 0927   CHOLHDL 2.8 10/26/2016 0927   CHOLHDL 2.9 05/23/2016 0949   VLDL 18 05/23/2016 0949   LDLCALC 73 10/26/2016 0927     Wt Readings from Last 3 Encounters:  07/05/17 185 lb 6.4 oz (84.1 kg)  05/03/17 183 lb 9.6 oz (83.3 kg)  02/01/17 184 lb 12.8 oz (83.8 kg)      Other studies Reviewed: Additional studies/ records that were reviewed today include: TTE 2016, Cath 01/29/16, Holter 12/25/16 - personally reviewed  Review of the above records today demonstrates:  - Left ventricle: The cavity size was normal. Wall thickness was   normal. Systolic function  was normal. The estimated ejection   fraction was in the range of 55% to 60%. Wall motion was normal;   there were no regional wall motion abnormalities.    Mid LAD lesion, 50% stenosed.  2nd Mrg lesion, 25% stenosed.  The left ventricular systolic function is normal.  RPDA lesion, 95% stenosed. Post intervention with overlapping drug eluting stents postdilated to > 3 mm, there is a 0% residual stenosis.  Due to severe tortuosity in his aortic arch, would not pursue right radial approach in this patient in the future if catheterization was needed.  Normal pulmonary artery pressures.     Normal  sinus rhythm with average heart rate 83bpm. The heart rate ranged from 55-116bpm.  Frequent PVCs, bigeminal PVCs, ventricular couplets and salvos.  Occasional PACs  PVC load was 46%  Holter 06/02/17 - personally reviewed Minimum HR: 49 BPM at 7:12:05 AM(2) Maximum HR: 123 BPM at 12:40:53 PM(2) Average HR: 75 BPM 31% PVCs <1% APCs Sinus rhythm  One run of 10 beats of SVT Zero atrial fibrillation  ASSESSMENT AND PLAN:  1.  PVCs: AP monitor continues to show a high burden of PVCs.  PVCs appear to be coming from the posterior left ventricle.  We discussed the possibility of medical management with further antiarrhythmics versus ablation.  Risks and benefits of ablation.  Risks include bleeding, tamponade, heart block, stroke, among others.  He would like to continue with medical management.  He would like to avoid amiodarone.  We Jackqulyn Mendel start him on mexiletine.  We Breona Cherubin get a repeat EKG next week to see about side effects of therapy..  2. Coronary artery disease: That is post PCI to the RCA.  Continue aspirin and Plavix.  3. Hypertension: Well-controlled today.  No changes.  4. Hypothyroidism: Continue Synthroid.  Attempting to avoid amiodarone.  Current medicines are reviewed at length with the patient today.   The patient does not have concerns regarding his medicines.  The  following changes were made today: Mexiletine  Labs/ tests ordered today include:  No orders of the defined types were placed in this encounter.    Disposition:   FU with Cartel Mauss 3 months  Signed, Jancy Sprankle Meredith Leeds, MD  07/05/2017 8:50 AM     [facility] [address] [place] [address] [phone] (office) [phone] (fax)

## 2017-07-12 ENCOUNTER — Ambulatory Visit (INDEPENDENT_AMBULATORY_CARE_PROVIDER_SITE_OTHER): Payer: Medicare HMO | Admitting: Ophthalmology

## 2017-07-17 ENCOUNTER — Ambulatory Visit (INDEPENDENT_AMBULATORY_CARE_PROVIDER_SITE_OTHER): Payer: Medicare HMO | Admitting: *Deleted

## 2017-07-17 VITALS — Ht 68.0 in | Wt 182.0 lb

## 2017-07-17 DIAGNOSIS — I493 Ventricular premature depolarization: Secondary | ICD-10-CM | POA: Diagnosis not present

## 2017-07-17 NOTE — Progress Notes (Signed)
Pt here for EKG. EKG and rhythm strip done.  Reviewed with Dr. Caryl Comes and pt to continue same medications. Pt made aware.

## 2017-07-27 ENCOUNTER — Encounter (INDEPENDENT_AMBULATORY_CARE_PROVIDER_SITE_OTHER): Payer: Medicare HMO | Admitting: Ophthalmology

## 2017-07-27 DIAGNOSIS — H35033 Hypertensive retinopathy, bilateral: Secondary | ICD-10-CM

## 2017-07-27 DIAGNOSIS — H35373 Puckering of macula, bilateral: Secondary | ICD-10-CM

## 2017-07-27 DIAGNOSIS — H353131 Nonexudative age-related macular degeneration, bilateral, early dry stage: Secondary | ICD-10-CM | POA: Diagnosis not present

## 2017-07-27 DIAGNOSIS — I10 Essential (primary) hypertension: Secondary | ICD-10-CM | POA: Diagnosis not present

## 2017-07-27 DIAGNOSIS — H43813 Vitreous degeneration, bilateral: Secondary | ICD-10-CM | POA: Diagnosis not present

## 2017-07-27 DIAGNOSIS — H2511 Age-related nuclear cataract, right eye: Secondary | ICD-10-CM | POA: Diagnosis not present

## 2017-07-27 DIAGNOSIS — H59032 Cystoid macular edema following cataract surgery, left eye: Secondary | ICD-10-CM

## 2017-08-02 ENCOUNTER — Ambulatory Visit: Payer: Medicare HMO | Admitting: Cardiology

## 2017-08-09 ENCOUNTER — Other Ambulatory Visit: Payer: Self-pay

## 2017-08-09 MED ORDER — VERAPAMIL HCL ER 240 MG PO TBCR: 240.0000 mg | EXTENDED_RELEASE_TABLET | Freq: Every day | ORAL | 3 refills | Status: DC

## 2017-10-03 NOTE — Progress Notes (Addendum)
Electrophysiology Office Note   Date:  10/04/2017   ID:  ATZEL MCCAMBRIDGE, DOB 1950/09/13, MRN [id number]  PCP:  [name], MD  Cardiologist:  [name] Primary Electrophysiologist:  Tuan Tippin Meredith Leeds, MD    Chief Complaint  Patient presents with  . Follow-up    PVC's  . Shortness of Breath     History of Present Illness: Duane Reyes is a 67 y.o. male who is being seen today for the evaluation of PVCs at the request of [name], MD. Presenting today for electrophysiology evaluation. He has history of coronary disease with coronary CTA showing significant calcification. Left heart cath 01/2016 showed 50% LAD, 25% OM 2, 95% PDA status post PCI. He has a history of PVCs. Was initially on Cardizem, but discontinued due to severe fatigue and bradycardia. He does have thyroid disease and has been on Synthroid. He wore a Holter monitor which showed 46% PVCs.  On mexiletine at his last visit.  Subsequent EKGs showed no further episodes of PVCs.  Today, denies symptoms of palpitations, chest pain, orthopnea, PND, lower extremity edema, claudication, dizziness, presyncope, syncope, bleeding, or neurologic sequela. The patient is tolerating medications without difficulties.  His main complaint today is dyspnea on exertion.  Feels well when he is at rest.  He sleeps on 2 pillows at night and has no orthopnea symptoms.  He does not have PND as well.  He gets quite short of breath with just about any activity.  2 years ago, he had a stent placed in his PDA.  He continued to have shortness of breath after stent was placed.   Past Medical History:  Diagnosis Date  . Arthritis    "left knee" (01/29/2016)  . 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 CAD in native artery    a. Abnl cardiac CT -> LHC 01/2016 s/p 50% mLAD, 25% OM2, 95% RPDA which was treated with overlapping DES.  [name] Chronic lower back pain   . Frequent PVCs    a. 12/2015 - Holter showed SB, NSR, ST, HR 54-108, with frequent PVCs  in singles, couplets, and bigemy, with elevated PVC load 24%.  [name] GERD (gastroesophageal reflux disease)   . History of shingles   . Hyperlipidemia   . Hypertension   . Hypothyroidism   . Prostate cancer ([place])   . Tortuosity of aortic arch, -would not pursue Rt radial approach in this patient for future cardiac caths    Past Surgical History:  Procedure Laterality Date  . BACK SURGERY    . CARDIAC CATHETERIZATION N/A 01/29/2016   Procedure: Right/Left Heart Cath and Coronary Angiography;  Surgeon: Jettie Booze, MD;  Location: [facility];  Service: Cardiovascular;  Laterality: N/A;  . CARDIAC CATHETERIZATION N/A 01/29/2016   Procedure: Coronary Stent Intervention 2.5/16mm Synergy-proximal PDA;  Surgeon: Jettie Booze, MD;  Location: [facility] [facility];  Service: Cardiovascular;  Laterality: N/A;  . CATARACT EXTRACTION W/ INTRAOCULAR LENS IMPLANT Left 11/2015  . COLONOSCOPY    . CORONARY ANGIOPLASTY WITH STENT PLACEMENT  01/29/2016   "2 stents"  . CYST EXCISION Right 2005   "near bicep"  . ESOPHAGOGASTRODUODENOSCOPY    . KNEE ARTHROSCOPY Left 2012   "torn meniscus"  . POSTERIOR LUMBAR FUSION  02/2013   "L4-5; hardware in place"  . PROSTATE BIOPSY  2013  . PROSTATE SURGERY  2013   "brachytherapy for prostate radiation"  . TONSILLECTOMY  1950s     Current Outpatient Medications  Medication Sig Dispense  Refill  . acetaminophen (TYLENOL) 325 MG tablet Take 2 tablets (650 mg total) by mouth every 4 (four) hours as needed for headache or mild pain.    [name] aspirin 81 MG chewable tablet Chew 81 mg by mouth daily.    [name] atorvastatin (LIPITOR) 40 MG tablet Take 1 tablet (40 mg total) by mouth daily at 6 PM. 30 tablet 6  . clopidogrel (PLAVIX) 75 MG tablet Take 1 tablet (75 mg total) by mouth daily with breakfast. 90 tablet 3  . levothyroxine (SYNTHROID, LEVOTHROID) 112 MCG tablet Take 112 mcg by mouth daily before breakfast.    . mexiletine (MEXITIL) 150 MG capsule Take 1  capsule (150 mg total) 2 (two) times daily by mouth. 60 capsule 3  . Multiple Vitamins-Minerals (PRESERVISION AREDS 2 PO) Take 1 capsule by mouth 2 (two) times daily.    . nitroGLYCERIN (NITROSTAT) 0.4 MG SL tablet Place 0.4 mg under the tongue every 5 (five) minutes as needed for chest pain (x 3 doses).    . pantoprazole (PROTONIX) 40 MG tablet Take 1 tablet (40 mg total) by mouth daily. 90 tablet 3  . verapamil (CALAN-SR) 240 MG CR tablet Take 1 tablet (240 mg total) by mouth at bedtime. 90 tablet 3   No current facility-administered medications for this visit.     Allergies:   Topamax [topiramate]; Celebrex [celecoxib]; Crestor [rosuvastatin calcium]; and Pitavastatin   Social History:  The patient  reports that  has never smoked. he has never used smokeless tobacco. He reports that he drinks alcohol. He reports that he does not use drugs.   Family History:  The patient's family history includes Heart attack in his father; Heart disease in his father.    ROS:  Please see the history of present illness.   Otherwise, review of systems is positive for dyspnea on exertion.   All other systems are reviewed and negative.   PHYSICAL EXAM: VS:  BP 126/80   Pulse 63   Ht 5\' 7"  (1.702 m)   Wt 176 lb (79.8 kg)   BMI 27.57 kg/m  , BMI Body mass index is 27.57 kg/m. GEN: Well nourished, well developed, in no acute distress  HEENT: normal  Neck: no JVD, carotid bruits, or masses Cardiac: RRR; no murmurs, rubs, or gallops,no edema  Respiratory:  clear to auscultation bilaterally, normal work of breathing GI: soft, nontender, nondistended, + BS MS: no deformity or atrophy  Skin: warm and dry Neuro:  Strength and sensation are intact Psych: euthymic mood, full affect  EKG:  EKG is ordered today. Personal review of the ekg ordered shows sinus rhythm, rate 63, IMI (old)  Recent Labs: 10/26/2016: ALT 32 01/05/2017: Hemoglobin 16.2; Platelets 181.0; TSH 0.58    Lipid Panel     Component  Value Date/Time   CHOL 149 10/26/2016 0927   TRIG 116 10/26/2016 0927   HDL 53 10/26/2016 0927   CHOLHDL 2.8 10/26/2016 0927   CHOLHDL 2.9 05/23/2016 0949   VLDL 18 05/23/2016 0949   LDLCALC 73 10/26/2016 0927     Wt Readings from Last 3 Encounters:  10/04/17 176 lb (79.8 kg)  07/17/17 182 lb (82.6 kg)  07/05/17 185 lb 6.4 oz (84.1 kg)      Other studies Reviewed: Additional studies/ records that were reviewed today include: TTE 2016, Cath 01/29/16, Holter 12/25/16 - personally reviewed  Review of the above records today demonstrates:  - Left ventricle: The cavity size was normal. Wall thickness was   normal.  Systolic function was normal. The estimated ejection   fraction was in the range of 55% to 60%. Wall motion was normal;   there were no regional wall motion abnormalities.    Mid LAD lesion, 50% stenosed.  2nd Mrg lesion, 25% stenosed.  The left ventricular systolic function is normal.  RPDA lesion, 95% stenosed. Post intervention with overlapping drug eluting stents postdilated to > 3 mm, there is a 0% residual stenosis.  Due to severe tortuosity in his aortic arch, would not pursue right radial approach in this patient in the future if catheterization was needed.  Normal pulmonary artery pressures.     Normal sinus rhythm with average heart rate 83bpm. The heart rate ranged from 55-116bpm.  Frequent PVCs, bigeminal PVCs, ventricular couplets and salvos.  Occasional PACs  PVC load was 46%  Holter 06/02/17 - personally reviewed Minimum HR: 49 BPM at 7:12:05 AM(2) Maximum HR: 123 BPM at 12:40:53 PM(2) Average HR: 75 BPM 31% PVCs <1% APCs Sinus rhythm  One run of 10 beats of SVT Zero atrial fibrillation  ASSESSMENT AND PLAN:  1.  PVCs: Was placed on mexiletine at his last visit.  This appears to have greatly improved his PVCs, and his symptoms have also improved.  He is continued to have shortness of breath.  I do not feel like this is due to his PVC  burden.  Flecainide and propafenone would not be appropriate medications due to history of coronary artery disease.  Amiodarone would not be appropriate as it has long-term side effects and this is likely chronic medication.  Also the patient has thyroid issues and thus amiodarone is less than ideal.  Her mexiletine is the most appropriate medication at this time.  2. Coronary artery disease with chronic stable angina: Status post PCI to the RCA.  Currently on aspirin and Plavix.  He is continued to have shortness of breath.  Nichalos Brenton order a [employer] today.  3. Hypertension: Well-controlled.  No changes.  4. Hypothyroidism: Continue Synthroid.  Attempting to avoid amiodarone.  Current medicines are reviewed at length with the patient today.   The patient does not have concerns regarding his medicines.  The following changes were made today: none  Labs/ tests ordered today include:  Orders Placed This Encounter  Procedures  . Myocardial Perfusion Imaging  . EKG 12-Lead     Disposition:   FU with Shrihaan Porzio 6 months  Signed, Drayke Grabel Meredith Leeds, MD  10/04/2017 9:19 AM     [facility] [address] [place] [address] [phone] (office) [phone] (fax)

## 2017-10-04 ENCOUNTER — Ambulatory Visit: Payer: Medicare HMO | Admitting: Cardiology

## 2017-10-04 ENCOUNTER — Encounter (INDEPENDENT_AMBULATORY_CARE_PROVIDER_SITE_OTHER): Payer: Self-pay

## 2017-10-04 ENCOUNTER — Encounter: Payer: Self-pay | Admitting: Cardiology

## 2017-10-04 VITALS — BP 126/80 | HR 63 | Ht 67.0 in | Wt 176.0 lb

## 2017-10-04 DIAGNOSIS — R0602 Shortness of breath: Secondary | ICD-10-CM

## 2017-10-04 DIAGNOSIS — I25118 Atherosclerotic heart disease of native coronary artery with other forms of angina pectoris: Secondary | ICD-10-CM | POA: Diagnosis not present

## 2017-10-04 DIAGNOSIS — R0609 Other forms of dyspnea: Secondary | ICD-10-CM | POA: Diagnosis not present

## 2017-10-04 DIAGNOSIS — I493 Ventricular premature depolarization: Secondary | ICD-10-CM | POA: Diagnosis not present

## 2017-10-04 DIAGNOSIS — I10 Essential (primary) hypertension: Secondary | ICD-10-CM

## 2017-10-04 NOTE — Patient Instructions (Addendum)
Medication Instructions:  Your physician recommends that you continue on your current medications as directed. Please refer to the Current Medication list given to you today.  * If you need a refill on your cardiac medications before your next appointment, please call your pharmacy. *  Labwork: None ordered  Testing/Procedures: Your physician has requested that you have a lexiscan myoview. For further information please visit [email or web address]. Please follow instruction sheet, as given.  Follow-Up: Your physician wants you to follow-up in: 6 months with [name].  You will receive a reminder letter in the mail two months in advance. If you don't receive a letter, please call our office to schedule the follow-up appointment.  Thank you for choosing [facility]!!   Trinidad Curet, RN [phone]  Any Other Special Instructions Will Be Listed Below (If Applicable).  Cardiac Nuclear Scan A cardiac nuclear scan is a test that measures blood flow to the heart when a person is resting and when he or she is exercising. The test looks for problems such as:  Not enough blood reaching a portion of the heart.  The heart muscle not working normally.  You may need this test if:  You have heart disease.  You have had abnormal lab results.  You have had heart surgery or angioplasty.  You have chest pain.  You have shortness of breath.  In this test, a radioactive dye (tracer) is injected into your bloodstream. After the tracer has traveled to your heart, an imaging device is used to measure how much of the tracer is absorbed by or distributed to various areas of your heart. This procedure is usually done at a hospital and takes 2-4 hours. Tell a health care provider about:  Any allergies you have.  All medicines you are taking, including vitamins, herbs, eye drops, creams, and over-the-counter medicines.  Any problems you or family members have had with the use of anesthetic  medicines.  Any blood disorders you have.  Any surgeries you have had.  Any medical conditions you have.  Whether you are pregnant or may be pregnant. What are the risks? Generally, this is a safe procedure. However, problems may occur, including:  Serious chest pain and heart attack. This is only a risk if the stress portion of the test is done.  Rapid heartbeat.  Sensation of warmth in your chest. This usually passes quickly.  What happens before the procedure?  Ask your health care provider about changing or stopping your regular medicines. This is especially important if you are taking diabetes medicines or blood thinners.  Remove your jewelry on the day of the procedure. What happens during the procedure?  An IV tube will be inserted into one of your veins.  Your health care provider will inject a small amount of radioactive tracer through the tube.  You will wait for 20-40 minutes while the tracer travels through your bloodstream.  Your heart activity will be monitored with an electrocardiogram (ECG).  You will lie down on an exam table.  Images of your heart will be taken for about 15-20 minutes.  You may be asked to exercise on a treadmill or stationary bike. While you exercise, your heart's activity will be monitored with an ECG, and your blood pressure will be checked. If you are unable to exercise, you may be given a medicine to increase blood flow to parts of your heart.  When blood flow to your heart has peaked, a tracer will again be injected through the  IV tube.  After 20-40 minutes, you will get back on the exam table and have more images taken of your heart.  When the procedure is over, your IV tube will be removed. The procedure may vary among health care providers and hospitals. Depending on the type of tracer used, scans may need to be repeated 3-4 hours later. What happens after the procedure?  Unless your health care provider tells you otherwise,  you may return to your normal schedule, including diet, activities, and medicines.  Unless your health care provider tells you otherwise, you may increase your fluid intake. This will help flush the contrast dye from your body. Drink enough fluid to keep your urine clear or pale yellow.  It is up to you to get your test results. Ask your health care provider, or the department that is doing the test, when your results will be ready. Summary  A cardiac nuclear scan measures the blood flow to the heart when a person is resting and when he or she is exercising.  You may need this test if you are at risk for heart disease.  Tell your health care provider if you are pregnant.  Unless your health care provider tells you otherwise, increase your fluid intake. This will help flush the contrast dye from your body. Drink enough fluid to keep your urine clear or pale yellow. This information is not intended to replace advice given to you by your health care provider. Make sure you discuss any questions you have with your health care provider. Document Released: 09/09/2004 Document Revised: 08/17/2016 Document Reviewed: 07/24/2013 Elsevier Interactive Patient Education  2017 [employer].

## 2017-10-05 ENCOUNTER — Telehealth: Payer: Self-pay | Admitting: *Deleted

## 2017-10-05 ENCOUNTER — Telehealth (HOSPITAL_COMMUNITY): Payer: Self-pay | Admitting: *Deleted

## 2017-10-05 NOTE — Telephone Encounter (Signed)
Patient given detailed instructions per Myocardial Perfusion Study Information Sheet for the test on 10/10/17. Patient notified to arrive 15 minutes early and that it is imperative to arrive on time for appointment to keep from having the test rescheduled.  If you need to cancel or reschedule your appointment, please call the office within 24 hours of your appointment. . Patient verbalized understanding. Kirstie Peri

## 2017-10-05 NOTE — Telephone Encounter (Signed)
Prior authorization forms completed for MEXILETINE and faxed to [facility].

## 2017-10-09 NOTE — Telephone Encounter (Signed)
**Note De-Identified [name] Obfuscation** We received a letter Duane Reyes fax from [facility] concerning the Mexiletine PA. The letter states that the pt must try and fail Amiodarone, Propafenone and Flecainide before they will approve this PA request. According to the pts med list he has not taken any of these meds in the past.  Will forward message to dr Duane Reyes and his nurse, Duane Reyes.

## 2017-10-10 ENCOUNTER — Ambulatory Visit (HOSPITAL_COMMUNITY): Payer: Medicare HMO | Attending: Cardiovascular Disease

## 2017-10-10 DIAGNOSIS — I517 Cardiomegaly: Secondary | ICD-10-CM | POA: Diagnosis not present

## 2017-10-10 DIAGNOSIS — I519 Heart disease, unspecified: Secondary | ICD-10-CM | POA: Diagnosis not present

## 2017-10-10 DIAGNOSIS — R0602 Shortness of breath: Secondary | ICD-10-CM | POA: Insufficient documentation

## 2017-10-10 DIAGNOSIS — Z8249 Family history of ischemic heart disease and other diseases of the circulatory system: Secondary | ICD-10-CM | POA: Diagnosis not present

## 2017-10-10 DIAGNOSIS — I4510 Unspecified right bundle-branch block: Secondary | ICD-10-CM | POA: Diagnosis not present

## 2017-10-10 DIAGNOSIS — R0609 Other forms of dyspnea: Secondary | ICD-10-CM | POA: Insufficient documentation

## 2017-10-10 LAB — MYOCARDIAL PERFUSION IMAGING
CHL CUP NUCLEAR SDS: 0
CHL CUP RESTING HR STRESS: 57 {beats}/min
LHR: 0.39
LV dias vol: 117 mL (ref 62–150)
LVSYSVOL: 85 mL
Peak HR: 80 {beats}/min
SRS: 7
SSS: 7
TID: 1.02

## 2017-10-10 MED ORDER — TECHNETIUM TC 99M TETROFOSMIN IV KIT
32.2000 | PACK | Freq: Once | INTRAVENOUS | Status: AC | PRN
  Administered 2017-10-10: 32.2 via INTRAVENOUS
  Filled 2017-10-10: qty 33

## 2017-10-10 MED ORDER — TECHNETIUM TC 99M TETROFOSMIN IV KIT
11.0000 | PACK | Freq: Once | INTRAVENOUS | Status: AC | PRN
  Administered 2017-10-10: 11 via INTRAVENOUS
  Filled 2017-10-10: qty 11

## 2017-10-10 MED ORDER — REGADENOSON 0.4 MG/5ML IV SOLN
0.4000 mg | Freq: Once | INTRAVENOUS | Status: AC
  Administered 2017-10-10: 0.4 mg via INTRAVENOUS

## 2017-10-10 NOTE — Telephone Encounter (Signed)
Progress note adjusted to include this information.

## 2017-10-11 NOTE — Telephone Encounter (Signed)
**Note De-Identified Xayvier Vallez Obfuscation** I have done an appeal on the pts Mexiletine PA (form that was faxed to [place] from [facility]) which includes the updated information added by Dr Curt Bears.

## 2017-10-17 NOTE — Telephone Encounter (Signed)
**Note De-Identified Duane Reyes Obfuscation** Approval received Jhanvi Drakeford fax from [facility] on the pts Mexiletine PA.  Approval good from 10/16/17 until 08/28/18.  I have notified the pts pharmacy.

## 2017-10-19 ENCOUNTER — Telehealth: Payer: Self-pay | Admitting: *Deleted

## 2017-10-19 ENCOUNTER — Telehealth (HOSPITAL_COMMUNITY): Payer: Self-pay | Admitting: Cardiology

## 2017-10-19 DIAGNOSIS — R9439 Abnormal result of other cardiovascular function study: Secondary | ICD-10-CM

## 2017-10-19 DIAGNOSIS — I493 Ventricular premature depolarization: Secondary | ICD-10-CM

## 2017-10-19 NOTE — Telephone Encounter (Signed)
User: Cherie Dark A Date/time: 10/19/17 9:07 AM  Comment: Called pt and lmsg for him to CB to sch echo.  Context:  Outcome: Left Message  Phone number: [phone] Phone Type: Home Phone  Comm. type: Telephone Call type: Outgoing  Contact: Duane Reyes C Relation to patient: Self

## 2017-10-19 NOTE — Telephone Encounter (Signed)
Duane Reyes is returning your call   Thanks

## 2017-10-19 NOTE — Telephone Encounter (Signed)
-----   Message from [name], MD sent at 10/11/2017  8:28 AM EST ----- Myoview with low EF. Needs TTE to reassess LVEF as an explanation for SOB.

## 2017-10-24 ENCOUNTER — Other Ambulatory Visit: Payer: Self-pay

## 2017-10-24 ENCOUNTER — Ambulatory Visit (HOSPITAL_COMMUNITY): Payer: Medicare HMO | Attending: Cardiovascular Disease

## 2017-10-24 DIAGNOSIS — R9439 Abnormal result of other cardiovascular function study: Secondary | ICD-10-CM

## 2017-10-24 DIAGNOSIS — I493 Ventricular premature depolarization: Secondary | ICD-10-CM | POA: Diagnosis not present

## 2017-10-24 DIAGNOSIS — I10 Essential (primary) hypertension: Secondary | ICD-10-CM | POA: Diagnosis not present

## 2017-11-03 ENCOUNTER — Telehealth: Payer: Self-pay | Admitting: *Deleted

## 2017-11-03 NOTE — Telephone Encounter (Addendum)
Per [name]: Pt is cleared from a cardiac standpoint for Left Total Knee Replacement. Pt is an intermediate risk for intermediate risk procedure. Hold Plavix 5-7 days prior to the procedure and restart as soon as possible post op.  Will fax completed clearance form to Dr. Ruel Favors office.

## 2017-11-03 NOTE — Telephone Encounter (Signed)
   [facility] Pre-operative Risk Assessment    Request for surgical clearance:  1. What type of surgery is being performed?  Left total knee    2. When is this surgery scheduled?  tbd   3. What type of clearance is required (medical clearance vs. Pharmacy clearance to hold med vs. Both)?  Both   4. Are there any medications that need to be held prior to surgery and how long? Plavix   5. Practice name and name of physician performing surgery?  [facility] Dr. Richardson Landry Lucey   6. What is your office phone and fax number?  Office: [phone] Fax: [phone]   7. Anesthesia type (None, local, MAC, general) ? Not specified   Stanton Kidney 11/03/2017, 4:59 PM  _________________________________________________________________   (provider comments below)

## 2017-11-09 ENCOUNTER — Telehealth: Payer: Self-pay | Admitting: Cardiology

## 2017-11-09 NOTE — Telephone Encounter (Signed)
Cardiac pre-op clearance refaxed via Epic

## 2017-11-09 NOTE — Telephone Encounter (Signed)
New Message  Pt calling to check on the status of his pre op clearance, states that Dr. Ronnie Derby never received it. Please call

## 2017-11-09 NOTE — Telephone Encounter (Signed)
Returned pt call. Pt aware that the clearance has been refaxed. Pt verbalized appreciation.

## 2017-11-20 ENCOUNTER — Other Ambulatory Visit: Payer: Self-pay | Admitting: Orthopedic Surgery

## 2017-11-21 ENCOUNTER — Other Ambulatory Visit: Payer: Self-pay | Admitting: Cardiology

## 2017-11-21 MED ORDER — MEXILETINE HCL 150 MG PO CAPS: 150.0000 mg | ORAL_CAPSULE | Freq: Two times a day (BID) | ORAL | 3 refills | Status: DC

## 2017-11-30 ENCOUNTER — Telehealth: Payer: Self-pay | Admitting: Cardiology

## 2017-11-30 NOTE — Telephone Encounter (Signed)
Pt apologized and told me that he actually has the instructions to hold Plavix for a week. He thanks me for calling anyway!

## 2017-11-30 NOTE — Telephone Encounter (Signed)
Patient was cleared for knee replacement but patient would like to go over plavix instructions

## 2017-11-30 NOTE — Telephone Encounter (Signed)
Close  

## 2017-12-01 ENCOUNTER — Encounter: Payer: Self-pay | Admitting: Pulmonary Disease

## 2017-12-01 ENCOUNTER — Ambulatory Visit: Payer: Medicare HMO | Admitting: Pulmonary Disease

## 2017-12-01 VITALS — BP 124/80 | HR 76 | Ht 67.0 in | Wt 180.4 lb

## 2017-12-01 DIAGNOSIS — J984 Other disorders of lung: Secondary | ICD-10-CM | POA: Diagnosis not present

## 2017-12-01 DIAGNOSIS — R0609 Other forms of dyspnea: Secondary | ICD-10-CM

## 2017-12-01 DIAGNOSIS — J410 Simple chronic bronchitis: Secondary | ICD-10-CM | POA: Diagnosis not present

## 2017-12-01 NOTE — Progress Notes (Signed)
Subjective:   PATIENT ID: Duane Reyes GENDER: male DOB: [birth year], MRN: [id number]  Synopsis: Referred in April 2019 for bronchitis; had been seen here in 2018 for dyspnea.  HPI  Chief Complaint  Patient presents with  . Follow-up    reports having bronchitis and needs approval for knee surgery following treatment   Duane Reyes was here a year ago to see Dr. Lamonte Sakai for evaluation of dyspnea. In the last 7 weks he has been dealing with "bronchitis".  He has been treated with symbicort, several antibiotics and increased antacid therapy for this cough.  He has been to the doctor nearly weekly for the last several weeks.  He says taht he has also been treated with prednisone.  He is here to see [place] today because he has knee surgery in a few weeks and he wanted to get checked out. > never had any green or yellow mucus > no therapy particularly > he has been better in the last 5 days > he says that talking would make it worse > He never had a fever > He thinks that he may have had one sick contact at the onset of the illness.  > he has been in and out of the hospital visiting his mother around the time of her surgery > he was also visiting a cousin recently who was sick with a respiratory infection who lives in a nursing home  Dyspnea:  > He says that he was a little short of breath with this illness, though he is always short of breath > he is limiting his activity over the dyspnea > he is quite limited by his leg due to the arthritis, can't exercise due ot that > walking makes him more short off  Past Medical History:  Diagnosis Date  . Arthritis    "left knee" (01/29/2016)  . 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 CAD in native artery    a. Abnl cardiac CT -> LHC 01/2016 s/p 50% mLAD, 25% OM2, 95% RPDA which was treated with overlapping DES.  [name] Chronic lower back pain   . Frequent PVCs    a. 12/2015 - Holter showed SB, NSR, ST, HR 54-108, with frequent PVCs in singles, couplets,  and bigemy, with elevated PVC load 24%.  [name] GERD (gastroesophageal reflux disease)   . History of shingles   . Hyperlipidemia   . Hypertension   . Hypothyroidism   . Prostate cancer ([place])   . Tortuosity of aortic arch, -would not pursue Rt radial approach in this patient for future cardiac caths      Family History  Problem Relation Age of Onset  . Heart attack Father   . Heart disease Father      Social History   Socioeconomic History  . Marital status: Married    Spouse name: Not on file  . Number of children: Not on file  . Years of education: Not on file  . Highest education level: Not on file  Occupational History  . Not on file  Social Needs  . Financial resource strain: Not on file  . Food insecurity:    Worry: Not on file    Inability: Not on file  . Transportation needs:    Medical: Not on file    Non-medical: Not on file  Tobacco Use  . Smoking status: Never Smoker  . Smokeless tobacco: Never Used  Substance and Sexual Activity  . Alcohol use: Yes    Comment: 01/29/2016 "glass  of wine q couple months"  . Drug use: No  . Sexual activity: Not Currently  Lifestyle  . Physical activity:    Days per week: Not on file    Minutes per session: Not on file  . Stress: Not on file  Relationships  . Social connections:    Talks on phone: Not on file    Gets together: Not on file    Attends religious service: Not on file    Active member of club or organization: Not on file    Attends meetings of clubs or organizations: Not on file    Relationship status: Not on file  . Intimate partner violence:    Fear of current or ex partner: Not on file    Emotionally abused: Not on file    Physically abused: Not on file    Forced sexual activity: Not on file  Other Topics Concern  . Not on file  Social History Narrative  . Not on file     Allergies  Allergen Reactions  . Topamax [Topiramate] Other (See Comments)    blurred vision, tired, ringing in ears, restless,  confussion blurred vision, tired, ringing in ears, restless, confussion  . Celebrex [Celecoxib]     unknown  . Crestor [Rosuvastatin Calcium]     myalgias  . Pitavastatin Rash    Per patient had a rash and a cough     Outpatient Medications Prior to Visit  Medication Sig Dispense Refill  . aspirin 81 MG chewable tablet Chew 81 mg by mouth daily.    [name] acetaminophen (TYLENOL) 325 MG tablet Take 2 tablets (650 mg total) by mouth every 4 (four) hours as needed for headache or mild pain.    [name] atorvastatin (LIPITOR) 40 MG tablet Take 1 tablet (40 mg total) by mouth daily at 6 PM. 30 tablet 6  . clopidogrel (PLAVIX) 75 MG tablet Take 1 tablet (75 mg total) by mouth daily with breakfast. 90 tablet 3  . Coenzyme Q10 (CO Q 10) 100 MG CAPS Take 1 capsule by mouth daily.    [name] levothyroxine (SYNTHROID, LEVOTHROID) 112 MCG tablet Take 112 mcg by mouth daily before breakfast.    . mexiletine (MEXITIL) 150 MG capsule Take 1 capsule (150 mg total) by mouth 2 (two) times daily. 180 capsule 3  . Multiple Vitamins-Minerals (PRESERVISION AREDS 2 PO) Take 1 capsule by mouth 2 (two) times daily.    . nitroGLYCERIN (NITROSTAT) 0.4 MG SL tablet Place 0.4 mg under the tongue every 5 (five) minutes as needed for chest pain (x 3 doses).    . pantoprazole (PROTONIX) 40 MG tablet Take 1 tablet (40 mg total) by mouth daily. 90 tablet 3  . ranitidine (ZANTAC) 150 MG capsule Take 1 capsule by mouth 2 (two) times daily.  3  . verapamil (CALAN-SR) 240 MG CR tablet Take 1 tablet (240 mg total) by mouth at bedtime. 90 tablet 3   No facility-administered medications prior to visit.     Review of Systems  Constitutional: Negative for chills, fever, malaise/fatigue and weight loss.  HENT: Negative for congestion, nosebleeds, sinus pain and sore throat.   Eyes: Negative for photophobia, pain and discharge.  Respiratory: Positive for cough and sputum production. Negative for hemoptysis, shortness of breath and wheezing.     Cardiovascular: Negative for chest pain, palpitations, orthopnea and leg swelling.  Gastrointestinal: Negative for abdominal pain, constipation, diarrhea, nausea and vomiting.  Genitourinary: Negative for dysuria, frequency, hematuria and urgency.  Musculoskeletal: Negative  for back pain, joint pain, myalgias and neck pain.  Skin: Negative for itching and rash.  Neurological: Negative for tingling, tremors, sensory change, speech change, focal weakness, seizures, weakness and headaches.  Psychiatric/Behavioral: Negative for memory loss, substance abuse and suicidal ideas. The patient is not nervous/anxious.       Objective:  Physical Exam   Vitals:   12/01/17 1358  BP: 124/80  Pulse: 76  SpO2: 100%  Weight: 180 lb 6.4 oz (81.8 kg)  Height: 5\' 7"  (1.702 m)    Gen: well appearing, no acute distress HENT: NCAT, OP clear, neck supple without masses Eyes: PERRL, EOMi Lymph: no cervical lymphadenopathy PULM: CTA B CV: RRR, no mgr, no JVD GI: BS+, soft, nontender, no hsm Derm: no rash or skin breakdown MSK: significant scoliosis Neuro: A&Ox4, CN II-XII intact, strength 5/5 in all 4 extremities Psyche: normal mood and affect   CBC    Component Value Date/Time   WBC 6.5 01/05/2017 0838   RBC 5.05 01/05/2017 0838   HGB 16.2 01/05/2017 0838   HCT 47.7 01/05/2017 0838   PLT 181.0 01/05/2017 0838   MCV 94.4 01/05/2017 0838   MCH 29.7 01/30/2016 0350   MCHC 33.9 01/05/2017 0838   RDW 14.0 01/05/2017 0838   LYMPHSABS 1.3 01/05/2017 0838   MONOABS 0.8 01/05/2017 0838   EOSABS 0.3 01/05/2017 0838   BASOSABS 0.0 01/05/2017 0838     Chest imaging: May 2018 CT chest images independently reviewed showing fairly severe scoliosis changes but no significant pulmonary parenchymal abnormality (some mild reticular changes in bases, non-specific and rare)  PFT: April 2018 ratio normal, FVC 2.73 L 70% predicted, total lung capacity 3.43 L 55% predicted, ERV 40% predicted, DLCO  20.1-74% predicted  Labs: June 2017 arterial blood gas 7.35, 36, 96, 20  Path:  Echo:  Heart Catheterization:       Assessment & Plan:   DOE (dyspnea on exertion) - Plan: Pulmonary function test, CT Chest High Resolution  Restrictive lung disease  Simple chronic bronchitis (HCC)  Discussion: Izrael comes back to see [place] again because he has been sick lately and he has upcoming knee surgery so he wants to make sure that his lungs are okay.  Reviewing the workup from last year shows that he has restrictive lung disease in the setting of fairly significant thoracic cavity compression from his scoliosis.  There is very minimal and nonspecific reticular change in the periphery of his lung where the lung is either stretched or compressed to the maximum degree related to his abnormally shaped thoracic capacity.  This phenomena has been described in patients who have abnormally shaped vertebral cavities and I think is a nonspecific form of fibrosis that does not reflect a diffuse parenchymal lung disease.  In summary, I do not think he has a lung problem other than restrictive lung disease secondary to scoliosis.  However, because there was concern for diffuse parenchymal lung disease a year ago and he has this upcoming surgery we will repeat a lung function test and high-resolution CT scan to make sure there is no interval progression.  On the visit when he returns we will also check a 6-minute walk test.  He seems to be over the most recent episode of bronchitis so I see no further treatment for that.  Plan: For restrictive lung disease due to scoliosis: We will check another lung function test and we will check another high-resolution CT scan of the chest to be confident that there is no  evidence of a diffuse parenchymal lung disease On the next visit we will check a 6-minute walk At the 6-minute walk distance and workup is okay on that visit then from my standpoint it will be fine for you  to proceed with knee surgery on April 22.  Follow-up with a nurse practitioner on the week of April 15    Current Outpatient Medications:  .  aspirin 81 MG chewable tablet, Chew 81 mg by mouth daily., Disp: , Rfl:  .  acetaminophen (TYLENOL) 325 MG tablet, Take 2 tablets (650 mg total) by mouth every 4 (four) hours as needed for headache or mild pain., Disp: , Rfl:  .  atorvastatin (LIPITOR) 40 MG tablet, Take 1 tablet (40 mg total) by mouth daily at 6 PM., Disp: 30 tablet, Rfl: 6 .  clopidogrel (PLAVIX) 75 MG tablet, Take 1 tablet (75 mg total) by mouth daily with breakfast., Disp: 90 tablet, Rfl: 3 .  Coenzyme Q10 (CO Q 10) 100 MG CAPS, Take 1 capsule by mouth daily., Disp: , Rfl:  .  levothyroxine (SYNTHROID, LEVOTHROID) 112 MCG tablet, Take 112 mcg by mouth daily before breakfast., Disp: , Rfl:  .  mexiletine (MEXITIL) 150 MG capsule, Take 1 capsule (150 mg total) by mouth 2 (two) times daily., Disp: 180 capsule, Rfl: 3 .  Multiple Vitamins-Minerals (PRESERVISION AREDS 2 PO), Take 1 capsule by mouth 2 (two) times daily., Disp: , Rfl:  .  nitroGLYCERIN (NITROSTAT) 0.4 MG SL tablet, Place 0.4 mg under the tongue every 5 (five) minutes as needed for chest pain (x 3 doses)., Disp: , Rfl:  .  pantoprazole (PROTONIX) 40 MG tablet, Take 1 tablet (40 mg total) by mouth daily., Disp: 90 tablet, Rfl: 3 .  ranitidine (ZANTAC) 150 MG capsule, Take 1 capsule by mouth 2 (two) times daily., Disp: , Rfl: 3 .  verapamil (CALAN-SR) 240 MG CR tablet, Take 1 tablet (240 mg total) by mouth at bedtime., Disp: 90 tablet, Rfl: 3

## 2017-12-01 NOTE — Patient Instructions (Signed)
Plan: For restrictive lung disease due to scoliosis: We will check another lung function test and we will check another high-resolution CT scan of the chest to be confident that there is no evidence of a diffuse parenchymal lung disease On the next visit we will check a 6-minute walk At the 6-minute walk distance and workup is okay on that visit then from my standpoint it will be fine for you to proceed with knee surgery on April 22.  Follow-up with a nurse practitioner on the week of April 15

## 2017-12-05 ENCOUNTER — Ambulatory Visit (INDEPENDENT_AMBULATORY_CARE_PROVIDER_SITE_OTHER): Payer: Medicare HMO | Admitting: Pulmonary Disease

## 2017-12-05 DIAGNOSIS — R0609 Other forms of dyspnea: Secondary | ICD-10-CM

## 2017-12-05 LAB — PULMONARY FUNCTION TEST
DL/VA % pred: 105 %
DL/VA: 4.59 ml/min/mmHg/L
DLCO UNC % PRED: 70 %
DLCO unc: 19.03 ml/min/mmHg
FEF 25-75 PRE: 2.95 L/s
FEF 25-75 Post: 2.57 L/sec
FEF2575-%Change-Post: -12 %
FEF2575-%Pred-Post: 115 %
FEF2575-%Pred-Pre: 132 %
FEV1-%CHANGE-POST: -2 %
FEV1-%PRED-POST: 79 %
FEV1-%PRED-PRE: 81 %
FEV1-POST: 2.26 L
FEV1-Pre: 2.31 L
FEV1FVC-%Change-Post: -3 %
FEV1FVC-%Pred-Pre: 115 %
FEV6-%CHANGE-POST: 1 %
FEV6-%PRED-POST: 75 %
FEV6-%Pred-Pre: 74 %
FEV6-POST: 2.74 L
FEV6-PRE: 2.69 L
FEV6FVC-%Change-Post: 0 %
FEV6FVC-%PRED-POST: 105 %
FEV6FVC-%PRED-PRE: 106 %
FVC-%Change-Post: 1 %
FVC-%PRED-POST: 71 %
FVC-%Pred-Pre: 70 %
FVC-Post: 2.75 L
FVC-Pre: 2.71 L
POST FEV1/FVC RATIO: 82 %
POST FEV6/FVC RATIO: 100 %
PRE FEV6/FVC RATIO: 100 %
Pre FEV1/FVC ratio: 85 %
RV % PRED: 82 %
RV: 1.81 L
TLC % PRED: 73 %
TLC: 4.59 L

## 2017-12-05 NOTE — Progress Notes (Signed)
PFT done today. 

## 2017-12-06 NOTE — Pre-Procedure Instructions (Signed)
Duane Reyes  12/06/2017      [id number] [place], [place] - [address] [place] [address] Phone: [phone] Fax: [facility] [place], [place] [address] Phone: [phone] Fax: [phone]    Your procedure is scheduled on  Monday 12/18/17  Report to [facility] Admitting at 615 A.M.  Call this number if you have problems the morning of surgery:  [phone]   Remember:  Do not eat food or drink liquids after midnight.  Take these medicines the morning of surgery with A SIP OF WATER-   LEVOTHYROXINE, PANTOPRAZOLE (PROTONIX), RANITIDINE (ZANTAC)  7 days prior to surgery STOP taking any Aspirin(unless otherwise instructed by your surgeon), Aleve, Naproxen, Ibuprofen, Motrin, Advil, Goody's, BC's, all herbal medications, fish oil, and all vitamins   Do not wear jewelry, make-up or nail polish.  Do not wear lotions, powders, or perfumes, or deodorant.  Do not shave 48 hours prior to surgery.  Men may shave face and neck.  Do not bring valuables to the hospital.  [facility] is not responsible for any belongings or valuables.  Contacts, dentures or bridgework may not be worn into surgery.  Leave your suitcase in the car.  After surgery it may be brought to your room.  For patients admitted to the hospital, discharge time will be determined by your treatment team.  Patients discharged the day of surgery will not be allowed to drive home.   Name and phone number of your driver    Special instructions: [facility] - Preparing for Surgery  Before surgery, you can play an important role.  Because skin is not sterile, your skin needs to be as free of germs as possible.  You can reduce the number of germs on you skin by washing with CHG (chlorahexidine gluconate) soap before surgery.  CHG is an antiseptic cleaner  which kills germs and bonds with the skin to continue killing germs even after washing.  Please DO NOT use if you have an allergy to CHG or antibacterial soaps.  If your skin becomes reddened/irritated stop using the CHG and inform your nurse when you arrive at Short Stay.  Do not shave (including legs and underarms) for at least 48 hours prior to the first CHG shower.  You may shave your face.  Please follow these instructions carefully:   1.  Shower with CHG Soap the night before surgery and the                                morning of Surgery.  2.  If you choose to wash your hair, wash your hair first as usual with your       normal shampoo.  3.  After you shampoo, rinse your hair and body thoroughly to remove the                      Shampoo.  4.  Use CHG as you would any other liquid soap.  You can apply chg directly       to the skin and wash gently with scrungie or a clean washcloth.  5.  Apply the CHG Soap to your body ONLY FROM THE NECK DOWN.  Do not use on open wounds or open sores.  Avoid contact with your eyes,       ears, mouth and genitals (private parts).  Wash genitals (private parts)       with your normal soap.  6.  Wash thoroughly, paying special attention to the area where your surgery        will be performed.  7.  Thoroughly rinse your body with warm water from the neck down.  8.  DO NOT shower/wash with your normal soap after using and rinsing off       the CHG Soap.  9.  Pat yourself dry with a clean towel.            10.  Wear clean pajamas.            11.  Place clean sheets on your bed the night of your first shower and do not        sleep with pets.  Day of Surgery  Do not apply any lotions/deoderants the morning of surgery.  Please wear clean clothes to the hospital/surgery center.       Please read over the following fact sheets that you were given. MRSA Information and Surgical Site Infection Prevention

## 2017-12-06 NOTE — Pre-Procedure Instructions (Signed)
Duane Reyes  12/06/2017      [id number] - [place], [place] - [address] [place] [address] Phone: [phone] Fax: [facility] - [place], [place] [address] Phone: [phone] Fax: [phone]    Your procedure is scheduled on  Monday 12/18/17  Report to [facility] Admitting at 615 A.M.  Call this number if you have problems the morning of surgery:  [phone]   Remember:  Do not eat food or drink liquids after midnight.  Take these medicines the morning of surgery with A SIP OF WATER-   LEVOTHYROXINE, PANTOPRAZOLE (PROTONIX), RANITIDINE (ZANTAC), MEXILETINE, VERAPAMIL  7 days prior to surgery STOP taking any Aspirin(unless otherwise instructed by your surgeon), Aleve, Naproxen, Ibuprofen, Motrin, Advil, Goody's, BC's, all herbal medications, fish oil, and all vitamins   Do not wear jewelry, make-up or nail polish.  Do not wear lotions, powders, or perfumes, or deodorant.  Do not shave 48 hours prior to surgery.  Men may shave face and neck.  Do not bring valuables to the hospital.  [facility] is not responsible for any belongings or valuables.  Contacts, dentures or bridgework may not be worn into surgery.  Leave your suitcase in the car.  After surgery it may be brought to your room.  For patients admitted to the hospital, discharge time will be determined by your treatment team.  Patients discharged the day of surgery will not be allowed to drive home.   Name and phone number of your driver    Special instructions: [facility] - Preparing for Surgery  Before surgery, you can play an important role.  Because skin is not sterile, your skin needs to be as free of germs as possible.  You can reduce the number of germs on you skin by washing with CHG (chlorahexidine gluconate) soap before surgery.  CHG  is an antiseptic cleaner which kills germs and bonds with the skin to continue killing germs even after washing.  Please DO NOT use if you have an allergy to CHG or antibacterial soaps.  If your skin becomes reddened/irritated stop using the CHG and inform your nurse when you arrive at Short Stay.  Do not shave (including legs and underarms) for at least 48 hours prior to the first CHG shower.  You may shave your face.  Please follow these instructions carefully:   1.  Shower with CHG Soap the night before surgery and the                                morning of Surgery.  2.  If you choose to wash your hair, wash your hair first as usual with your       normal shampoo.  3.  After you shampoo, rinse your hair and body thoroughly to remove the                      Shampoo.  4.  Use CHG as you would any other liquid soap.  You can apply chg directly       to the skin and wash gently with scrungie or a clean washcloth.  5.  Apply the CHG Soap to your body ONLY FROM THE NECK DOWN.  Do not use on open wounds or open sores.  Avoid contact with your eyes,       ears, mouth and genitals (private parts).  Wash genitals (private parts)       with your normal soap.  6.  Wash thoroughly, paying special attention to the area where your surgery        will be performed.  7.  Thoroughly rinse your body with warm water from the neck down.  8.  DO NOT shower/wash with your normal soap after using and rinsing off       the CHG Soap.  9.  Pat yourself dry with a clean towel.            10.  Wear clean pajamas.            11.  Place clean sheets on your bed the night of your first shower and do not        sleep with pets.  Day of Surgery  Do not apply any lotions/deoderants the morning of surgery.  Please wear clean clothes to the hospital/surgery center.       Please read over the following fact sheets that you were given. MRSA Information and Surgical Site Infection Prevention

## 2017-12-07 ENCOUNTER — Encounter (HOSPITAL_COMMUNITY)
Admission: RE | Admit: 2017-12-07 | Discharge: 2017-12-07 | Disposition: A | Discharge status: Home / Self Care | Payer: Medicare HMO | Source: Ambulatory Visit | Attending: Orthopedic Surgery | Admitting: Orthopedic Surgery

## 2017-12-07 ENCOUNTER — Encounter (HOSPITAL_COMMUNITY): Payer: Self-pay

## 2017-12-07 DIAGNOSIS — Z01812 Encounter for preprocedural laboratory examination: Secondary | ICD-10-CM | POA: Diagnosis not present

## 2017-12-07 DIAGNOSIS — M1712 Unilateral primary osteoarthritis, left knee: Secondary | ICD-10-CM | POA: Insufficient documentation

## 2017-12-07 HISTORY — DX: Dyspnea, unspecified: R06.00

## 2017-12-07 HISTORY — DX: Acute bronchiolitis, unspecified: J21.9

## 2017-12-07 LAB — CBC WITH DIFFERENTIAL/PLATELET
Basophils Absolute: 0 10*3/uL (ref 0.0–0.1)
Basophils Relative: 0 %
Eosinophils Absolute: 0.2 10*3/uL (ref 0.0–0.7)
Eosinophils Relative: 3 %
HCT: 49.1 % (ref 39.0–52.0)
HEMOGLOBIN: 16.3 g/dL (ref 13.0–17.0)
LYMPHS ABS: 1.6 10*3/uL (ref 0.7–4.0)
LYMPHS PCT: 24 %
MCH: 32.1 pg (ref 26.0–34.0)
MCHC: 33.2 g/dL (ref 30.0–36.0)
MCV: 96.7 fL (ref 78.0–100.0)
MONOS PCT: 8 %
Monocytes Absolute: 0.6 10*3/uL (ref 0.1–1.0)
NEUTROS PCT: 65 %
Neutro Abs: 4.5 10*3/uL (ref 1.7–7.7)
Platelets: 161 10*3/uL (ref 150–400)
RBC: 5.08 MIL/uL (ref 4.22–5.81)
RDW: 13.8 % (ref 11.5–15.5)
WBC: 6.9 10*3/uL (ref 4.0–10.5)

## 2017-12-07 LAB — COMPREHENSIVE METABOLIC PANEL
ALK PHOS: 53 U/L (ref 38–126)
ALT: 17 U/L (ref 17–63)
AST: 17 U/L (ref 15–41)
Albumin: 3.9 g/dL (ref 3.5–5.0)
Anion gap: 9 (ref 5–15)
BUN: 16 mg/dL (ref 6–20)
CALCIUM: 8.9 mg/dL (ref 8.9–10.3)
CO2: 22 mmol/L (ref 22–32)
Chloride: 108 mmol/L (ref 101–111)
Creatinine, Ser: 1.12 mg/dL (ref 0.61–1.24)
Glucose, Bld: 96 mg/dL (ref 65–99)
Potassium: 4.4 mmol/L (ref 3.5–5.1)
Sodium: 139 mmol/L (ref 135–145)
TOTAL PROTEIN: 6.9 g/dL (ref 6.5–8.1)
Total Bilirubin: 1.1 mg/dL (ref 0.3–1.2)

## 2017-12-07 LAB — SURGICAL PCR SCREEN
MRSA, PCR: NEGATIVE
Staphylococcus aureus: NEGATIVE

## 2017-12-11 ENCOUNTER — Inpatient Hospital Stay: Admission: RE | Admit: 2017-12-11 | Payer: Medicare HMO | Source: Ambulatory Visit

## 2017-12-11 ENCOUNTER — Ambulatory Visit (INDEPENDENT_AMBULATORY_CARE_PROVIDER_SITE_OTHER)
Admission: RE | Admit: 2017-12-11 | Discharge: 2017-12-11 | Disposition: A | Discharge status: Home / Self Care | Payer: Medicare HMO | Source: Ambulatory Visit | Attending: Pulmonary Disease | Admitting: Pulmonary Disease

## 2017-12-11 DIAGNOSIS — R0609 Other forms of dyspnea: Secondary | ICD-10-CM

## 2017-12-12 ENCOUNTER — Ambulatory Visit (INDEPENDENT_AMBULATORY_CARE_PROVIDER_SITE_OTHER): Payer: Medicare HMO | Admitting: *Deleted

## 2017-12-12 ENCOUNTER — Ambulatory Visit: Payer: Medicare HMO | Admitting: Adult Health

## 2017-12-12 ENCOUNTER — Encounter: Payer: Self-pay | Admitting: Adult Health

## 2017-12-12 DIAGNOSIS — Z01818 Encounter for other preprocedural examination: Secondary | ICD-10-CM | POA: Diagnosis not present

## 2017-12-12 DIAGNOSIS — R0600 Dyspnea, unspecified: Secondary | ICD-10-CM

## 2017-12-12 DIAGNOSIS — R0609 Other forms of dyspnea: Principal | ICD-10-CM

## 2017-12-12 DIAGNOSIS — J40 Bronchitis, not specified as acute or chronic: Secondary | ICD-10-CM | POA: Diagnosis not present

## 2017-12-12 NOTE — Patient Instructions (Signed)
Good luck with upcoming knee surgery .  Activity as tolerated.  Follow up in 4-6 months and As needed

## 2017-12-12 NOTE — Progress Notes (Signed)
SIX MIN WALK 12/12/2017 01/04/2017  Medications patient took Mexiletine, Synthroid, Protonix, and Zantac at 9:00 -  Supplimental Oxygen during Test? (L/min) No No  Laps 7 -  Partial Lap (in Meters) 18 -  Baseline BP (sitting) 118/78 -  Baseline Heartrate 73 -  Baseline Dyspnea (Borg Scale) 2 -  Baseline Fatigue (Borg Scale) 3 -  Baseline SPO2 95 -  BP (sitting) 124/78 -  Heartrate 66 -  Dyspnea (Borg Scale) 7 -  Fatigue (Borg Scale) 8 -  SPO2 97 -  BP (sitting) 120/78 -  Heartrate 53 -  SPO2 97 -  Stopped or Paused before Six Minutes No -  Interpretation Leg pain;Calf pain -  Distance Completed 354 -  Tech Comments: patient completed 7 full laps with 18 extra meters without stopping. patient complained of leg and calf pain at the end of the test.  steady walk/no complications/TA/CMA

## 2017-12-12 NOTE — Progress Notes (Signed)
@Patient  ID: Duane Reyes, male    DOB: 1951-03-02, 67 y.o.   MRN: [id number]  Chief Complaint  Patient presents with  . Follow-up    Dyspnea     Referring provider: [name], MD  HPI: 67 year old male never smoker seen for pulmonary consult December 01, 2017 for preop clearance . Has hx of bronchitis   TEST  May 2018 CT chest images independently reviewed showing fairly severe scoliosis changes but no significant pulmonary parenchymal abnormality (some mild reticular changes in bases, non-specific and rare)  PFT: April 2018 ratio normal, FVC 2.73 L 70% predicted, total lung capacity 3.43 L 55% predicted, ERV 40% predicted, DLCO 20.1-74% predicted   12/12/17  Follow up : Dyspnea /Preop Clearance  Patient returns for a one-week follow-up.  Patient was seen last visit for preop clearance.  He has been dealing with recurrent bronchitis.  Had several antibiotics.  He is also has upcoming knee surgery next week.  And needs to be cleared for surgery.  Patient says he is starting to feel better.  Patient had a PFT done on April 9 that showed mild lung restriction.  With a FEV1 at 79%, ratio 82, FVC 71, no significant bronchodilator response.  DLCO 70%. She was set up for a CT chest that showed mild bibasilar subpleural scarring.  No ILD.  Unchanged subpleural nodules considered benign. Patient says cough and congestion have decreased.  He denies any chest pain, orthopnea, edema.   Allergies  Allergen Reactions  . Topamax [Topiramate] Other (See Comments)    blurred vision, tired, ringing in ears, restless, confussion blurred vision, tired, ringing in ears, restless, confussion  . Celebrex [Celecoxib]     unknown  . Crestor [Rosuvastatin Calcium]     myalgias  . Pitavastatin Rash    Per patient had a rash and a cough    Immunization History  Administered Date(s) Administered  . DT 08/29/2009  . Influenza, High Dose Seasonal PF 06/20/2016, 07/14/2016, 06/06/2017  .  Pneumococcal Conjugate-13 10/20/2015  . Pneumococcal Polysaccharide-23 08/29/2006, 03/16/2017  . Td 08/29/2009  . Tdap 06/04/2015    Past Medical History:  Diagnosis Date  . Arthritis    "left knee" (01/29/2016)  . 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 Bronchiolitis   . CAD in native artery    a. Abnl cardiac CT -> LHC 01/2016 s/p 50% mLAD, 25% OM2, 95% RPDA which was treated with overlapping DES.  [name] Chronic lower back pain   . Dyspnea   . Frequent PVCs    a. 12/2015 - Holter showed SB, NSR, ST, HR 54-108, with frequent PVCs in singles, couplets, and bigemy, with elevated PVC load 24%.  [name] GERD (gastroesophageal reflux disease)   . History of shingles   . Hyperlipidemia   . Hypertension   . Hypothyroidism   . Prostate cancer ([place])   . Tortuosity of aortic arch, -would not pursue Rt radial approach in this patient for future cardiac caths     Tobacco History: Social History   Tobacco Use  Smoking Status Never Smoker  Smokeless Tobacco Never Used   Counseling given: Not Answered   Outpatient Encounter Medications as of 12/12/2017  Medication Sig  . acetaminophen (TYLENOL) 325 MG tablet Take 2 tablets (650 mg total) by mouth every 4 (four) hours as needed for headache or mild pain.  [name] aspirin 81 MG chewable tablet Chew 81 mg by mouth daily.  [name] atorvastatin (LIPITOR) 40 MG tablet Take 1 tablet (40 mg total) by  mouth daily at 6 PM.  . clopidogrel (PLAVIX) 75 MG tablet Take 1 tablet (75 mg total) by mouth daily with breakfast.  . Coenzyme Q10 (CO Q 10) 100 MG CAPS Take 1 capsule by mouth daily.  [name] levothyroxine (SYNTHROID, LEVOTHROID) 112 MCG tablet Take 112 mcg by mouth daily before breakfast.  . mexiletine (MEXITIL) 150 MG capsule Take 1 capsule (150 mg total) by mouth 2 (two) times daily.  . Multiple Vitamins-Minerals (PRESERVISION AREDS 2 PO) Take 1 capsule by mouth 2 (two) times daily.  . nitroGLYCERIN (NITROSTAT) 0.4 MG SL tablet Place 0.4 mg under the tongue every 5 (five)  minutes as needed for chest pain (x 3 doses).  . pantoprazole (PROTONIX) 40 MG tablet Take 1 tablet (40 mg total) by mouth daily.  . ranitidine (ZANTAC) 150 MG capsule Take 1 capsule by mouth 2 (two) times daily.  . verapamil (CALAN-SR) 240 MG CR tablet Take 1 tablet (240 mg total) by mouth at bedtime.   No facility-administered encounter medications on file as of 12/12/2017.      Review of Systems  Constitutional:   No  weight loss, night sweats,  Fevers, chills, fatigue, or  lassitude.  HEENT:   No headaches,  Difficulty swallowing,  Tooth/dental problems, or  Sore throat,                No sneezing, itching, ear ache, nasal congestion, post nasal drip,   CV:  No chest pain,  Orthopnea, PND, swelling in lower extremities, anasarca, dizziness, palpitations, syncope.   GI  No heartburn, indigestion, abdominal pain, nausea, vomiting, diarrhea, change in bowel habits, loss of appetite, bloody stools.   Resp: No shortness of breath with exertion or at rest.  No excess mucus, no productive cough,  No non-productive cough,  No coughing up of blood.  No change in color of mucus.  No wheezing.  No chest wall deformity  Skin: no rash or lesions.  GU: no dysuria, change in color of urine, no urgency or frequency.  No flank pain, no hematuria   MS:  No joint pain or swelling.  No decreased range of motion.  No back pain.    Physical Exam  BP 116/64 (BP Location: Left Arm, Cuff Size: Normal)   Pulse (!) 53   Ht 5' 7.5" (1.715 m)   Wt 180 lb (81.6 kg)   SpO2 97%   BMI 27.78 kg/m   GEN: A/Ox3; pleasant , NAD, well nourished    HEENT:  Tatums/AT,  EACs-clear, TMs-wnl, NOSE-clear, THROAT-clear, no lesions, no postnasal drip or exudate noted.   NECK:  Supple w/ fair ROM; no JVD; normal carotid impulses w/o bruits; no thyromegaly or nodules palpated; no lymphadenopathy.    RESP  Clear  P & A; w/o, wheezes/ rales/ or rhonchi. no accessory muscle use, no dullness to percussion  CARD:  RRR, no  m/r/g, no peripheral edema, pulses intact, no cyanosis or clubbing.  GI:   Soft & nt; nml bowel sounds; no organomegaly or masses detected.   Musco: Warm bil, no deformities or joint swelling noted.   Neuro: alert, no focal deficits noted.    Skin: Warm, no lesions or rashes    Lab Results:  CBC    Component Value Date/Time   WBC 6.9 12/07/2017 0921   RBC 5.08 12/07/2017 0921   HGB 16.3 12/07/2017 0921   HCT 49.1 12/07/2017 0921   PLT 161 12/07/2017 0921   MCV 96.7 12/07/2017 0921   MCH 32.1  12/07/2017 0921   MCHC 33.2 12/07/2017 0921   RDW 13.8 12/07/2017 0921   LYMPHSABS 1.6 12/07/2017 0921   MONOABS 0.6 12/07/2017 0921   EOSABS 0.2 12/07/2017 0921   BASOSABS 0.0 12/07/2017 0921    BMET    Component Value Date/Time   NA 139 12/07/2017 0921   K 4.4 12/07/2017 0921   CL 108 12/07/2017 0921   CO2 22 12/07/2017 0921   GLUCOSE 96 12/07/2017 0921   BUN 16 12/07/2017 0921   CREATININE 1.12 12/07/2017 0921   CREATININE 1.29 (H) 01/27/2016 1652   CALCIUM 8.9 12/07/2017 0921   GFRNONAA >60 12/07/2017 0921   GFRAA >60 12/07/2017 0921    BNP    Component Value Date/Time   BNP 30.0 04/29/2015 1225    ProBNP No results found for: PROBNP  Imaging: Ct Chest High Resolution  Result Date: 12/11/2017 CLINICAL DATA:  Shortness of breath, bronchitis.  Cough. EXAM: CT CHEST WITHOUT CONTRAST TECHNIQUE: Multidetector CT imaging of the chest was performed following the standard protocol without intravenous contrast. High resolution imaging of the lungs, as well as inspiratory and expiratory imaging, was performed. COMPARISON:  01/13/2017 and 01/21/2016. FINDINGS: Cardiovascular: Atherosclerotic calcification of the arterial vasculature, including extensive three-vessel involvement of the coronary arteries. Heart is enlarged. No pericardial effusion. Mediastinum/Nodes: No pathologically enlarged mediastinal or axillary lymph nodes. Hilar regions are difficult to evaluate without  IV contrast but appear grossly unremarkable. Esophagus is grossly unremarkable. Lungs/Pleura: Scattered subpleural nodules in the lung bases measure 5 mm or less in size, unchanged and likely benign subpleural lymph nodes. Mild subpleural scarring at the lung bases. No subpleural reticulation, traction bronchiectasis/bronchiolectasis, ground-glass, architectural distortion or honeycombing. 3 mm left upper lobe nodule (image 34), unchanged and considered benign. No pleural fluid. Airway is unremarkable. No air trapping. Upper Abdomen: Subcentimeter low-attenuation lesion in the right hepatic lobe is unchanged and likely benign. Visualized portions of the liver, gallbladder, adrenal glands, left kidney, spleen, pancreas, stomach and bowel are otherwise grossly unremarkable. No upper abdominal adenopathy. Musculoskeletal: Degenerative changes and scoliosis in the spine. No worrisome lytic or sclerotic lesions. IMPRESSION: 1. Mild bibasilar subpleural scarring. No definitive evidence of interstitial lung disease. 2. Aortic atherosclerosis (ICD10-170.0). Extensive three-vessel coronary artery calcification. Electronically Signed   By: Lorin Picket M.D.   On: 12/11/2017 15:55     Assessment & Plan:   No problem-specific Assessment & Plan notes found for this encounter.     Rexene Edison, NP 12/12/2017

## 2017-12-13 DIAGNOSIS — Z01818 Encounter for other preprocedural examination: Secondary | ICD-10-CM | POA: Insufficient documentation

## 2017-12-13 DIAGNOSIS — J40 Bronchitis, not specified as acute or chronic: Secondary | ICD-10-CM | POA: Insufficient documentation

## 2017-12-13 NOTE — Assessment & Plan Note (Signed)
Resolving.  Patient appears to be doing well from this.  PFTs show preserved lung function with only mild restriction.  CT chest showed no evidence of ILD.  Patient is advised may use Mucinex as needed.  If any postnasal drainage may use Claritin or Zyrtec.  Plan  Patient Instructions  Good luck with upcoming knee surgery .  Activity as tolerated.  Follow up in 4-6 months and As needed

## 2017-12-13 NOTE — Assessment & Plan Note (Signed)
Pulmonary preop clearance.  Patient's lung function is preserved with only mild restriction..  He seems to have recovered from recent bronchitis.  From a pulmonary aspect may proceed with knee surgery.  Advised on potential pulmonary complication risks.    Recommend  Short duration of surgery as much as possible and avoid paralytic if possible  DVT prophylaxis as recommended.  Aggressive pulmonary toilet with o2, bronchodilatation, and incentive spirometry and early ambulation

## 2017-12-15 MED ORDER — BUPIVACAINE LIPOSOME 1.3 % IJ SUSP
20.0000 mL | INTRAMUSCULAR | Status: DC
  Filled 2017-12-15: qty 20

## 2017-12-15 MED ORDER — SODIUM CHLORIDE 0.9 % IV SOLN
1000.0000 mg | INTRAVENOUS | Status: AC
  Administered 2017-12-18: 1000 mg via INTRAVENOUS
  Filled 2017-12-15: qty 1100

## 2017-12-17 NOTE — Anesthesia Preprocedure Evaluation (Addendum)
Anesthesia Evaluation  Patient identified by MRN, date of birth, ID band Patient awake    Reviewed: Allergy & Precautions, NPO status , Patient's Chart, lab work & pertinent test results  Airway Mallampati: II  TM Distance: >3 FB Neck ROM: Full    Dental   Pulmonary shortness of breath and with exertion,    breath sounds clear to auscultation       Cardiovascular hypertension, Pt. on medications + CAD, + Cardiac Stents and + Peripheral Vascular Disease   Rhythm:Regular Rate:Normal  Myocardial perfusion scan with low EF and infarct but no ischemia. Follow up echo with low normal EF (50-55%).   Neuro/Psych S/p lumbar spinal fusion negative neurological ROS     GI/Hepatic Neg liver ROS, GERD  Medicated,  Endo/Other  Hypothyroidism   Renal/GU negative Renal ROS     Musculoskeletal  (+) Arthritis ,   Abdominal   Peds  Hematology negative hematology ROS (+)   Anesthesia Other Findings   Reproductive/Obstetrics                            Lab Results  Component Value Date   WBC 6.9 12/07/2017   HGB 16.3 12/07/2017   HCT 49.1 12/07/2017   MCV 96.7 12/07/2017   PLT 161 12/07/2017   Lab Results  Component Value Date   CREATININE 1.12 12/07/2017   BUN 16 12/07/2017   NA 139 12/07/2017   K 4.4 12/07/2017   CL 108 12/07/2017   CO2 22 12/07/2017    Anesthesia Physical Anesthesia Plan  ASA: III  Anesthesia Plan: MAC and Spinal   Post-op Pain Management:  Regional for Post-op pain   Induction: Intravenous  PONV Risk Score and Plan: 1 and Ondansetron, Dexamethasone, Propofol infusion and Treatment may vary due to age or medical condition  Airway Management Planned: Natural Airway and Simple Face Mask  Additional Equipment:   Intra-op Plan:   Post-operative Plan:   Informed Consent: I have reviewed the patients History and Physical, chart, labs and discussed the procedure including  the risks, benefits and alternatives for the proposed anesthesia with the patient or authorized representative who has indicated his/her understanding and acceptance.   Dental advisory given  Plan Discussed with: CRNA  Anesthesia Plan Comments: (Pt would like to try spinal with previous spinal surgery. Plan for MAC with spinal and GA as back up. Last dose of plavix >7days per pt and pts wife.)       Anesthesia Quick Evaluation

## 2017-12-18 ENCOUNTER — Observation Stay (HOSPITAL_COMMUNITY)
Admission: RE | Admit: 2017-12-18 | Discharge: 2017-12-19 | Disposition: A | Discharge status: Home / Self Care | Payer: Medicare HMO | Source: Ambulatory Visit | Attending: Orthopedic Surgery | Admitting: Orthopedic Surgery

## 2017-12-18 ENCOUNTER — Ambulatory Visit (HOSPITAL_COMMUNITY): Payer: Medicare HMO | Admitting: Anesthesiology

## 2017-12-18 ENCOUNTER — Encounter (HOSPITAL_COMMUNITY)
Admission: RE | Disposition: A | Discharge status: Home / Self Care | Payer: Self-pay | Source: Ambulatory Visit | Attending: Orthopedic Surgery

## 2017-12-18 ENCOUNTER — Other Ambulatory Visit: Payer: Self-pay

## 2017-12-18 ENCOUNTER — Encounter (HOSPITAL_COMMUNITY): Payer: Self-pay | Admitting: *Deleted

## 2017-12-18 DIAGNOSIS — Z955 Presence of coronary angioplasty implant and graft: Secondary | ICD-10-CM | POA: Diagnosis not present

## 2017-12-18 DIAGNOSIS — Z8546 Personal history of malignant neoplasm of prostate: Secondary | ICD-10-CM | POA: Diagnosis not present

## 2017-12-18 DIAGNOSIS — I2510 Atherosclerotic heart disease of native coronary artery without angina pectoris: Secondary | ICD-10-CM | POA: Diagnosis not present

## 2017-12-18 DIAGNOSIS — Z96659 Presence of unspecified artificial knee joint: Secondary | ICD-10-CM

## 2017-12-18 DIAGNOSIS — E039 Hypothyroidism, unspecified: Secondary | ICD-10-CM | POA: Insufficient documentation

## 2017-12-18 DIAGNOSIS — Q2546 Tortuous aortic arch: Secondary | ICD-10-CM | POA: Insufficient documentation

## 2017-12-18 DIAGNOSIS — Z7902 Long term (current) use of antithrombotics/antiplatelets: Secondary | ICD-10-CM | POA: Diagnosis not present

## 2017-12-18 DIAGNOSIS — Z8619 Personal history of other infectious and parasitic diseases: Secondary | ICD-10-CM | POA: Insufficient documentation

## 2017-12-18 DIAGNOSIS — M1712 Unilateral primary osteoarthritis, left knee: Principal | ICD-10-CM | POA: Insufficient documentation

## 2017-12-18 DIAGNOSIS — Z79899 Other long term (current) drug therapy: Secondary | ICD-10-CM | POA: Insufficient documentation

## 2017-12-18 DIAGNOSIS — R0602 Shortness of breath: Secondary | ICD-10-CM | POA: Insufficient documentation

## 2017-12-18 DIAGNOSIS — I739 Peripheral vascular disease, unspecified: Secondary | ICD-10-CM | POA: Insufficient documentation

## 2017-12-18 DIAGNOSIS — Z981 Arthrodesis status: Secondary | ICD-10-CM | POA: Insufficient documentation

## 2017-12-18 DIAGNOSIS — Z888 Allergy status to other drugs, medicaments and biological substances status: Secondary | ICD-10-CM | POA: Diagnosis not present

## 2017-12-18 DIAGNOSIS — Z7982 Long term (current) use of aspirin: Secondary | ICD-10-CM | POA: Insufficient documentation

## 2017-12-18 DIAGNOSIS — I700 Atherosclerosis of aorta: Secondary | ICD-10-CM | POA: Insufficient documentation

## 2017-12-18 DIAGNOSIS — I493 Ventricular premature depolarization: Secondary | ICD-10-CM | POA: Insufficient documentation

## 2017-12-18 DIAGNOSIS — I10 Essential (primary) hypertension: Secondary | ICD-10-CM | POA: Insufficient documentation

## 2017-12-18 DIAGNOSIS — E785 Hyperlipidemia, unspecified: Secondary | ICD-10-CM | POA: Diagnosis not present

## 2017-12-18 DIAGNOSIS — K219 Gastro-esophageal reflux disease without esophagitis: Secondary | ICD-10-CM | POA: Diagnosis not present

## 2017-12-18 HISTORY — PX: TOTAL KNEE ARTHROPLASTY: SHX125

## 2017-12-18 SURGERY — ARTHROPLASTY, KNEE, TOTAL
Anesthesia: Monitor Anesthesia Care | Site: Knee | Laterality: Left

## 2017-12-18 MED ORDER — TRANEXAMIC ACID 1000 MG/10ML IV SOLN
1000.0000 mg | Freq: Once | INTRAVENOUS | Status: AC
  Administered 2017-12-18: 1000 mg via INTRAVENOUS
  Filled 2017-12-18: qty 10

## 2017-12-18 MED ORDER — BUPIVACAINE LIPOSOME 1.3 % IJ SUSP
INTRAMUSCULAR | Status: DC | PRN
  Administered 2017-12-18: 20 mL

## 2017-12-18 MED ORDER — GABAPENTIN 300 MG PO CAPS: 300.0000 mg | ORAL_CAPSULE | Freq: Once | ORAL | Status: DC

## 2017-12-18 MED ORDER — MIDAZOLAM HCL 2 MG/2ML IJ SOLN
1.0000 mg | Freq: Once | INTRAMUSCULAR | Status: AC
  Administered 2017-12-18: 1 mg via INTRAVENOUS

## 2017-12-18 MED ORDER — CHLORHEXIDINE GLUCONATE 4 % EX LIQD: 60.0000 mL | Freq: Once | CUTANEOUS | Status: DC

## 2017-12-18 MED ORDER — ASPIRIN 81 MG PO CHEW
81.0000 mg | CHEWABLE_TABLET | Freq: Two times a day (BID) | ORAL | Status: DC
  Administered 2017-12-18 – 2017-12-19 (×2): 81 mg via ORAL
  Filled 2017-12-18 (×2): qty 1

## 2017-12-18 MED ORDER — LACTATED RINGERS IV SOLN
INTRAVENOUS | Status: DC
  Administered 2017-12-18 (×2): via INTRAVENOUS

## 2017-12-18 MED ORDER — ONDANSETRON HCL 4 MG/2ML IJ SOLN: 4.0000 mg | Freq: Four times a day (QID) | INTRAMUSCULAR | Status: DC | PRN

## 2017-12-18 MED ORDER — PROMETHAZINE HCL 25 MG/ML IJ SOLN: 6.2500 mg | INTRAMUSCULAR | Status: DC | PRN

## 2017-12-18 MED ORDER — PHENYLEPHRINE HCL 10 MG/ML IJ SOLN
INTRAVENOUS | Status: DC | PRN
  Administered 2017-12-18: 20 ug/min via INTRAVENOUS

## 2017-12-18 MED ORDER — HYDROMORPHONE HCL 2 MG/ML IJ SOLN: 0.2500 mg | INTRAMUSCULAR | Status: DC | PRN

## 2017-12-18 MED ORDER — PROPOFOL 500 MG/50ML IV EMUL
INTRAVENOUS | Status: DC | PRN
  Administered 2017-12-18: 75 ug/kg/min via INTRAVENOUS

## 2017-12-18 MED ORDER — FAMOTIDINE 20 MG PO TABS
20.0000 mg | ORAL_TABLET | Freq: Every day | ORAL | Status: DC
  Administered 2017-12-19: 20 mg via ORAL
  Filled 2017-12-18: qty 1

## 2017-12-18 MED ORDER — ROPIVACAINE HCL 7.5 MG/ML IJ SOLN
INTRAMUSCULAR | Status: DC | PRN
  Administered 2017-12-18: 20 mL via PERINEURAL

## 2017-12-18 MED ORDER — MEXILETINE HCL 150 MG PO CAPS
150.0000 mg | ORAL_CAPSULE | Freq: Two times a day (BID) | ORAL | Status: DC
  Administered 2017-12-18 – 2017-12-19 (×2): 150 mg via ORAL
  Filled 2017-12-18 (×3): qty 1

## 2017-12-18 MED ORDER — ACETAMINOPHEN 500 MG PO TABS
1000.0000 mg | ORAL_TABLET | Freq: Four times a day (QID) | ORAL | Status: AC
  Administered 2017-12-18 – 2017-12-19 (×3): 1000 mg via ORAL
  Filled 2017-12-18 (×3): qty 2

## 2017-12-18 MED ORDER — MIDAZOLAM HCL 5 MG/5ML IJ SOLN
INTRAMUSCULAR | Status: DC | PRN
  Administered 2017-12-18 (×2): 1 mg via INTRAVENOUS

## 2017-12-18 MED ORDER — GABAPENTIN 300 MG PO CAPS
300.0000 mg | ORAL_CAPSULE | Freq: Three times a day (TID) | ORAL | Status: DC
  Administered 2017-12-18 – 2017-12-19 (×4): 300 mg via ORAL
  Filled 2017-12-18 (×4): qty 1

## 2017-12-18 MED ORDER — METHOCARBAMOL 500 MG PO TABS: 500.0000 mg | ORAL_TABLET | Freq: Four times a day (QID) | ORAL | Status: DC | PRN

## 2017-12-18 MED ORDER — CEFAZOLIN SODIUM-DEXTROSE 2-4 GM/100ML-% IV SOLN
2.0000 g | Freq: Four times a day (QID) | INTRAVENOUS | Status: AC
  Administered 2017-12-18 (×2): 2 g via INTRAVENOUS
  Filled 2017-12-18 (×2): qty 100

## 2017-12-18 MED ORDER — 0.9 % SODIUM CHLORIDE (POUR BTL) OPTIME
TOPICAL | Status: DC | PRN
  Administered 2017-12-18: 1000 mL

## 2017-12-18 MED ORDER — METOCLOPRAMIDE HCL 5 MG PO TABS: 5.0000 mg | ORAL_TABLET | Freq: Three times a day (TID) | ORAL | Status: DC | PRN

## 2017-12-18 MED ORDER — CEFAZOLIN SODIUM-DEXTROSE 2-4 GM/100ML-% IV SOLN
2.0000 g | INTRAVENOUS | Status: AC
  Administered 2017-12-18: 2 g via INTRAVENOUS

## 2017-12-18 MED ORDER — DEXAMETHASONE SODIUM PHOSPHATE 10 MG/ML IJ SOLN
INTRAMUSCULAR | Status: AC
  Filled 2017-12-18: qty 1

## 2017-12-18 MED ORDER — DEXAMETHASONE SODIUM PHOSPHATE 10 MG/ML IJ SOLN
8.0000 mg | Freq: Once | INTRAMUSCULAR | Status: AC
  Administered 2017-12-18: 8 mg via INTRAVENOUS

## 2017-12-18 MED ORDER — NITROGLYCERIN 0.4 MG SL SUBL: 0.4000 mg | SUBLINGUAL_TABLET | SUBLINGUAL | Status: DC | PRN

## 2017-12-18 MED ORDER — MIDAZOLAM HCL 2 MG/2ML IJ SOLN
INTRAMUSCULAR | Status: AC
  Filled 2017-12-18: qty 2

## 2017-12-18 MED ORDER — VERAPAMIL HCL ER 240 MG PO TBCR
240.0000 mg | EXTENDED_RELEASE_TABLET | Freq: Every day | ORAL | Status: DC
  Administered 2017-12-18: 240 mg via ORAL
  Filled 2017-12-18 (×2): qty 1

## 2017-12-18 MED ORDER — PROPOFOL 10 MG/ML IV BOLUS
INTRAVENOUS | Status: AC
  Filled 2017-12-18: qty 20

## 2017-12-18 MED ORDER — ALUM & MAG HYDROXIDE-SIMETH 200-200-20 MG/5ML PO SUSP: 30.0000 mL | ORAL | Status: DC | PRN

## 2017-12-18 MED ORDER — FLEET ENEMA 7-19 GM/118ML RE ENEM: 1.0000 | ENEMA | Freq: Once | RECTAL | Status: DC | PRN

## 2017-12-18 MED ORDER — ATORVASTATIN CALCIUM 40 MG PO TABS
40.0000 mg | ORAL_TABLET | Freq: Every day | ORAL | Status: DC
  Administered 2017-12-18: 40 mg via ORAL
  Filled 2017-12-18: qty 1

## 2017-12-18 MED ORDER — FENTANYL CITRATE (PF) 250 MCG/5ML IJ SOLN
INTRAMUSCULAR | Status: AC
  Filled 2017-12-18: qty 5

## 2017-12-18 MED ORDER — PANTOPRAZOLE SODIUM 40 MG PO TBEC: 40.0000 mg | DELAYED_RELEASE_TABLET | Freq: Every day | ORAL | Status: DC

## 2017-12-18 MED ORDER — LEVOTHYROXINE SODIUM 112 MCG PO TABS
112.0000 ug | ORAL_TABLET | Freq: Every day | ORAL | Status: DC
  Administered 2017-12-19: 112 ug via ORAL
  Filled 2017-12-18: qty 1

## 2017-12-18 MED ORDER — ZOLPIDEM TARTRATE 5 MG PO TABS: 5.0000 mg | ORAL_TABLET | Freq: Every evening | ORAL | Status: DC | PRN

## 2017-12-18 MED ORDER — SENNOSIDES-DOCUSATE SODIUM 8.6-50 MG PO TABS: 1.0000 | ORAL_TABLET | Freq: Every evening | ORAL | Status: DC | PRN

## 2017-12-18 MED ORDER — GABAPENTIN 300 MG PO CAPS
ORAL_CAPSULE | ORAL | Status: AC
  Administered 2017-12-18: 300 mg
  Filled 2017-12-18: qty 1

## 2017-12-18 MED ORDER — DOCUSATE SODIUM 100 MG PO CAPS
100.0000 mg | ORAL_CAPSULE | Freq: Two times a day (BID) | ORAL | Status: DC
  Administered 2017-12-18 – 2017-12-19 (×2): 100 mg via ORAL
  Filled 2017-12-18 (×2): qty 1

## 2017-12-18 MED ORDER — ONDANSETRON HCL 4 MG/2ML IJ SOLN
INTRAMUSCULAR | Status: AC
  Filled 2017-12-18: qty 2

## 2017-12-18 MED ORDER — ONDANSETRON HCL 4 MG PO TABS: 4.0000 mg | ORAL_TABLET | Freq: Four times a day (QID) | ORAL | Status: DC | PRN

## 2017-12-18 MED ORDER — SODIUM CHLORIDE 0.9 % IJ SOLN
INTRAMUSCULAR | Status: DC | PRN
  Administered 2017-12-18: 20 mL

## 2017-12-18 MED ORDER — ACETAMINOPHEN 500 MG PO TABS: 1000.0000 mg | ORAL_TABLET | Freq: Once | ORAL | Status: DC

## 2017-12-18 MED ORDER — BUPIVACAINE-EPINEPHRINE (PF) 0.25% -1:200000 IJ SOLN
INTRAMUSCULAR | Status: DC | PRN
  Administered 2017-12-18: 30 mL

## 2017-12-18 MED ORDER — HYDROMORPHONE HCL 2 MG/ML IJ SOLN: 0.5000 mg | INTRAMUSCULAR | Status: DC | PRN

## 2017-12-18 MED ORDER — DIPHENHYDRAMINE HCL 12.5 MG/5ML PO ELIX: 12.5000 mg | ORAL_SOLUTION | ORAL | Status: DC | PRN

## 2017-12-18 MED ORDER — BUPIVACAINE-EPINEPHRINE (PF) 0.25% -1:200000 IJ SOLN
INTRAMUSCULAR | Status: AC
  Filled 2017-12-18: qty 30

## 2017-12-18 MED ORDER — CLONIDINE HCL (ANALGESIA) 100 MCG/ML EP SOLN
EPIDURAL | Status: DC | PRN
  Administered 2017-12-18: 50 ug

## 2017-12-18 MED ORDER — BUPIVACAINE IN DEXTROSE 0.75-8.25 % IT SOLN
INTRATHECAL | Status: DC | PRN
  Administered 2017-12-18: 2 mL via INTRATHECAL

## 2017-12-18 MED ORDER — FENTANYL CITRATE (PF) 100 MCG/2ML IJ SOLN
50.0000 ug | Freq: Once | INTRAMUSCULAR | Status: AC
  Administered 2017-12-18: 50 ug via INTRAVENOUS

## 2017-12-18 MED ORDER — PANTOPRAZOLE SODIUM 40 MG PO TBEC
40.0000 mg | DELAYED_RELEASE_TABLET | Freq: Every day | ORAL | Status: DC
  Administered 2017-12-19: 40 mg via ORAL
  Filled 2017-12-18: qty 1

## 2017-12-18 MED ORDER — MENTHOL 3 MG MT LOZG: 1.0000 | LOZENGE | OROMUCOSAL | Status: DC | PRN

## 2017-12-18 MED ORDER — CLOPIDOGREL BISULFATE 75 MG PO TABS
75.0000 mg | ORAL_TABLET | Freq: Every day | ORAL | Status: DC
  Administered 2017-12-19: 75 mg via ORAL
  Filled 2017-12-18: qty 1

## 2017-12-18 MED ORDER — DEXAMETHASONE SODIUM PHOSPHATE 10 MG/ML IJ SOLN
10.0000 mg | Freq: Once | INTRAMUSCULAR | Status: AC
  Administered 2017-12-19: 10 mg via INTRAVENOUS
  Filled 2017-12-18: qty 1

## 2017-12-18 MED ORDER — BISACODYL 5 MG PO TBEC: 5.0000 mg | DELAYED_RELEASE_TABLET | Freq: Every day | ORAL | Status: DC | PRN

## 2017-12-18 MED ORDER — PROPOFOL 10 MG/ML IV BOLUS
INTRAVENOUS | Status: DC | PRN
  Administered 2017-12-18 (×2): 20 mg via INTRAVENOUS

## 2017-12-18 MED ORDER — PROPOFOL 500 MG/50ML IV EMUL: INTRAVENOUS | Status: DC | PRN

## 2017-12-18 MED ORDER — PHENOL 1.4 % MT LIQD: 1.0000 | OROMUCOSAL | Status: DC | PRN

## 2017-12-18 MED ORDER — OXYCODONE HCL 5 MG PO TABS
5.0000 mg | ORAL_TABLET | ORAL | Status: DC | PRN
  Administered 2017-12-19: 5 mg via ORAL
  Administered 2017-12-19: 10 mg via ORAL
  Filled 2017-12-18: qty 1
  Filled 2017-12-18: qty 2

## 2017-12-18 MED ORDER — METHOCARBAMOL 1000 MG/10ML IJ SOLN
500.0000 mg | Freq: Four times a day (QID) | INTRAVENOUS | Status: DC | PRN
  Filled 2017-12-18: qty 5

## 2017-12-18 MED ORDER — METOCLOPRAMIDE HCL 5 MG/ML IJ SOLN: 5.0000 mg | Freq: Three times a day (TID) | INTRAMUSCULAR | Status: DC | PRN

## 2017-12-18 MED ORDER — SODIUM CHLORIDE 0.9 % IR SOLN
Status: DC | PRN
  Administered 2017-12-18: 3000 mL

## 2017-12-18 MED ORDER — FENTANYL CITRATE (PF) 100 MCG/2ML IJ SOLN
INTRAMUSCULAR | Status: AC
  Filled 2017-12-18: qty 2

## 2017-12-18 MED ORDER — ONDANSETRON HCL 4 MG/2ML IJ SOLN
INTRAMUSCULAR | Status: DC | PRN
  Administered 2017-12-18: 4 mg via INTRAVENOUS

## 2017-12-18 MED ORDER — CEFAZOLIN SODIUM-DEXTROSE 2-4 GM/100ML-% IV SOLN
INTRAVENOUS | Status: AC
  Filled 2017-12-18: qty 100

## 2017-12-18 MED ORDER — BUPIVACAINE-EPINEPHRINE (PF) 0.5% -1:200000 IJ SOLN
INTRAMUSCULAR | Status: AC
  Filled 2017-12-18: qty 30

## 2017-12-18 MED ORDER — ACETAMINOPHEN 500 MG PO TABS
ORAL_TABLET | ORAL | Status: AC
  Administered 2017-12-18: 1000 mg
  Filled 2017-12-18: qty 2

## 2017-12-18 SURGICAL SUPPLY — 70 items
BANDAGE ACE 6X5 VEL STRL LF (GAUZE/BANDAGES/DRESSINGS) ×2 IMPLANT
BANDAGE ELASTIC 6 VELCRO ST LF (GAUZE/BANDAGES/DRESSINGS) ×1 IMPLANT
BANDAGE ESMARK 6X9 LF (GAUZE/BANDAGES/DRESSINGS) ×1 IMPLANT
BLADE SAGITTAL 13X1.27X60 (BLADE) ×2 IMPLANT
BLADE SAW SGTL 83.5X18.5 (BLADE) ×2 IMPLANT
BLADE SURG 10 STRL SS (BLADE) ×2 IMPLANT
BNDG CMPR 9X6 STRL LF SNTH (GAUZE/BANDAGES/DRESSINGS) ×1
BNDG ESMARK 6X9 LF (GAUZE/BANDAGES/DRESSINGS) ×2
BOWL SMART MIX CTS (DISPOSABLE) ×2 IMPLANT
BSPLAT TIB 5D E CMNT STM LT (Knees) ×1 IMPLANT
CEMENT BONE SIMPLEX SPEEDSET (Cement) ×4 IMPLANT
CLOSURE STERI-STRIP 1/4X4 (GAUZE/BANDAGES/DRESSINGS) ×1 IMPLANT
COVER SURGICAL LIGHT HANDLE (MISCELLANEOUS) ×2 IMPLANT
CUFF TOURNIQUET SINGLE 34IN LL (TOURNIQUET CUFF) ×2 IMPLANT
DECANTER SPIKE VIAL GLASS SM (MISCELLANEOUS) ×1 IMPLANT
DRAPE EXTREMITY T 121X128X90 (DRAPE) ×2 IMPLANT
DRAPE HALF SHEET 40X57 (DRAPES) ×2 IMPLANT
DRAPE INCISE IOBAN 66X45 STRL (DRAPES) ×4 IMPLANT
DRAPE U-SHAPE 47X51 STRL (DRAPES) ×2 IMPLANT
DRSG AQUACEL AG ADV 3.5X10 (GAUZE/BANDAGES/DRESSINGS) ×2 IMPLANT
DURAPREP 26ML APPLICATOR (WOUND CARE) ×4 IMPLANT
ELECT REM PT RETURN 9FT ADLT (ELECTROSURGICAL) ×2
ELECTRODE REM PT RTRN 9FT ADLT (ELECTROSURGICAL) ×1 IMPLANT
FEMUR  CMT CCR STD SZ8 L KNEE (Knees) ×1 IMPLANT
FEMUR CMT CCR STD SZ8 L KNEE (Knees) ×1 IMPLANT
FEMUR CMTD CCR STD SZ8 L KNEE (Knees) IMPLANT
GLOVE BIOGEL M 7.0 STRL (GLOVE) ×1 IMPLANT
GLOVE BIOGEL PI IND STRL 7.5 (GLOVE) IMPLANT
GLOVE BIOGEL PI IND STRL 8.5 (GLOVE) ×1 IMPLANT
GLOVE BIOGEL PI INDICATOR 7.5 (GLOVE) ×1
GLOVE BIOGEL PI INDICATOR 8.5 (GLOVE)
GLOVE SURG ORTHO 8.0 STRL STRW (GLOVE) ×3 IMPLANT
GOWN STRL REUS W/ TWL LRG LVL3 (GOWN DISPOSABLE) ×1 IMPLANT
GOWN STRL REUS W/ TWL XL LVL3 (GOWN DISPOSABLE) ×2 IMPLANT
GOWN STRL REUS W/TWL 2XL LVL3 (GOWN DISPOSABLE) ×2 IMPLANT
GOWN STRL REUS W/TWL LRG LVL3 (GOWN DISPOSABLE) ×2
GOWN STRL REUS W/TWL XL LVL3 (GOWN DISPOSABLE) ×4
HANDPIECE INTERPULSE COAX TIP (DISPOSABLE) ×2
HOOD PEEL AWAY FACE SHEILD DIS (HOOD) ×7 IMPLANT
INSERT TIBIA POLY EF/6-9X14 LT (Knees) ×1 IMPLANT
KIT BASIN OR (CUSTOM PROCEDURE TRAY) ×2 IMPLANT
KIT TURNOVER KIT B (KITS) ×2 IMPLANT
MANIFOLD NEPTUNE II (INSTRUMENTS) ×2 IMPLANT
NDL 18GX1X1/2 (RX/OR ONLY) (NEEDLE) IMPLANT
NEEDLE 18GX1X1/2 (RX/OR ONLY) (NEEDLE) IMPLANT
NEEDLE 22X1 1/2 (OR ONLY) (NEEDLE) ×4 IMPLANT
NS IRRIG 1000ML POUR BTL (IV SOLUTION) ×2 IMPLANT
PACK TOTAL JOINT (CUSTOM PROCEDURE TRAY) ×2 IMPLANT
PAD ARMBOARD 7.5X6 YLW CONV (MISCELLANEOUS) ×4 IMPLANT
SET HNDPC FAN SPRY TIP SCT (DISPOSABLE) ×1 IMPLANT
STEM POLY PAT PLY 35M KNEE (Knees) ×1 IMPLANT
STEM TIBIA 5 DEG SZ E L KNEE (Knees) IMPLANT
STRIP CLOSURE SKIN 1/2X4 (GAUZE/BANDAGES/DRESSINGS) ×2 IMPLANT
SUCTION FRAZIER HANDLE 10FR (MISCELLANEOUS) ×1
SUCTION TUBE FRAZIER 10FR DISP (MISCELLANEOUS) IMPLANT
SUT BONE WAX W31G (SUTURE) ×2 IMPLANT
SUT MNCRL AB 3-0 PS2 18 (SUTURE) ×2 IMPLANT
SUT VIC AB 0 CTB1 27 (SUTURE) ×2 IMPLANT
SUT VIC AB 1 CT1 27 (SUTURE) ×4
SUT VIC AB 1 CT1 27XBRD ANBCTR (SUTURE) ×2 IMPLANT
SUT VIC AB 2-0 CT1 27 (SUTURE) ×4
SUT VIC AB 2-0 CT1 TAPERPNT 27 (SUTURE) ×2 IMPLANT
SUT VLOC 180 0 24IN GS25 (SUTURE) ×2 IMPLANT
SYR 20CC LL (SYRINGE) ×4 IMPLANT
TIBIA STEM 5 DEG SZ E L KNEE (Knees) ×2 IMPLANT
TOWEL OR 17X24 6PK STRL BLUE (TOWEL DISPOSABLE) ×2 IMPLANT
TOWEL OR 17X26 10 PK STRL BLUE (TOWEL DISPOSABLE) ×2 IMPLANT
TRAY FOLEY W/BAG SLVR 16FR (SET/KITS/TRAYS/PACK) ×2
TRAY FOLEY W/BAG SLVR 16FR ST (SET/KITS/TRAYS/PACK) IMPLANT
WRAP KNEE MAXI GEL POST OP (GAUZE/BANDAGES/DRESSINGS) ×2 IMPLANT

## 2017-12-18 NOTE — Anesthesia Procedure Notes (Signed)
Spinal  Start time: 12/18/2017 8:22 AM End time: 12/18/2017 8:26 AM Staffing Anesthesiologist: Suzette Battiest, MD Performed: anesthesiologist  Preanesthetic Checklist Completed: patient identified, site marked, surgical consent, pre-op evaluation, timeout performed, IV checked, risks and benefits discussed and monitors and equipment checked Spinal Block Patient position: sitting Prep: site prepped and draped and DuraPrep Patient monitoring: blood pressure, continuous pulse ox and heart rate Approach: midline Location: L4-5 Injection technique: single-shot Needle Needle type: Pencan  Needle gauge: 24 G Needle length: 9 cm

## 2017-12-18 NOTE — H&P (Addendum)
Duane Reyes MRN:  [id number] DOB/SEX:  [birth year]/male  CHIEF COMPLAINT:  Painful left Knee  HISTORY: Patient is a 67 y.o. male presented with a history of pain in the left knee. Onset of symptoms was gradual starting a few years ago with gradually worsening course since that time. Patient has been treated conservatively with over-the-counter NSAIDs and activity modification. Patient currently rates pain in the knee at 10 out of 10 with activity. There is pain at night.  PAST MEDICAL HISTORY: Patient Active Problem List   Diagnosis Date Noted  . Bronchitis 12/13/2017  . Pre-operative clearance 12/13/2017  . 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 CAD in native artery 01/30/2016  . S/P angioplasty with stent 01/29/16 RPDA with 2 overlapping DES   01/30/2016  . Tortuosity of aortic arch, -would not pursue Rt radial approach in this patient for future cardiac caths 01/30/2016  . DOE (dyspnea on exertion)   . Coronary artery calcification seen on CT scan   . PVC (premature ventricular contraction) 01/12/2016  . HTN (hypertension) 04/30/2015  . Hyperlipidemia LDL goal <100 04/30/2015  . Spondylolisthesis of lumbar region L4-5 L5-S1 03/12/2013  . Spinal stenosis, lumbar region, with neurogenic claudication 03/12/2013   Past Medical History:  Diagnosis Date  . Arthritis    "left knee" (01/29/2016)  . 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 Bronchiolitis   . CAD in native artery    a. Abnl cardiac CT -> LHC 01/2016 s/p 50% mLAD, 25% OM2, 95% RPDA which was treated with overlapping DES.  [name] Chronic lower back pain   . Dyspnea   . Frequent PVCs    a. 12/2015 - Holter showed SB, NSR, ST, HR 54-108, with frequent PVCs in singles, couplets, and bigemy, with elevated PVC load 24%.  [name] GERD (gastroesophageal reflux disease)   . History of shingles   . Hyperlipidemia   . Hypertension   . Hypothyroidism   . Prostate cancer ([place])   . Tortuosity of aortic arch, -would not pursue Rt radial approach  in this patient for future cardiac caths    Past Surgical History:  Procedure Laterality Date  . BACK SURGERY    . CARDIAC CATHETERIZATION N/A 01/29/2016   Procedure: Right/Left Heart Cath and Coronary Angiography;  Surgeon: Jettie Booze, MD;  Location: [facility];  Service: Cardiovascular;  Laterality: N/A;  . CARDIAC CATHETERIZATION N/A 01/29/2016   Procedure: Coronary Stent Intervention 2.5/16mm Synergy-proximal PDA;  Surgeon: Jettie Booze, MD;  Location: [facility];  Service: Cardiovascular;  Laterality: N/A;  . CATARACT EXTRACTION W/ INTRAOCULAR LENS IMPLANT Left 11/2015  . COLONOSCOPY    . CORONARY ANGIOPLASTY WITH STENT PLACEMENT  01/29/2016   "2 stents"  . CYST EXCISION Right 2005   "near bicep"  . ESOPHAGOGASTRODUODENOSCOPY    . EYE SURGERY    . KNEE ARTHROSCOPY Left 2012   "torn meniscus"  . POSTERIOR LUMBAR FUSION  02/2013   "L4-5; hardware in place"  . PROSTATE BIOPSY  2013  . PROSTATE SURGERY  2013   "brachytherapy for prostate radiation"  . TONSILLECTOMY  1950s     MEDICATIONS:   Medications Prior to Admission  Medication Sig Dispense Refill Last Dose  . acetaminophen (TYLENOL) 325 MG tablet Take 2 tablets (650 mg total) by mouth every 4 (four) hours as needed for headache or mild pain.   Taking  . atorvastatin (LIPITOR) 40 MG tablet Take 1 tablet (40 mg total) by mouth daily at 6 PM. 30 tablet 6  Taking  . clopidogrel (PLAVIX) 75 MG tablet Take 1 tablet (75 mg total) by mouth daily with breakfast. 90 tablet 3 Taking  . Coenzyme Q10 (CO Q 10) 100 MG CAPS Take 1 capsule by mouth daily.   Taking  . levothyroxine (SYNTHROID, LEVOTHROID) 112 MCG tablet Take 112 mcg by mouth daily before breakfast.   Taking  . mexiletine (MEXITIL) 150 MG capsule Take 1 capsule (150 mg total) by mouth 2 (two) times daily. 180 capsule 3 Taking  . nitroGLYCERIN (NITROSTAT) 0.4 MG SL tablet Place 0.4 mg under the tongue every 5 (five) minutes as needed for chest pain (x 3  doses).   Taking  . pantoprazole (PROTONIX) 40 MG tablet Take 1 tablet (40 mg total) by mouth daily. 90 tablet 3 Taking  . ranitidine (ZANTAC) 150 MG capsule Take 1 capsule by mouth 2 (two) times daily.  3 Taking  . verapamil (CALAN-SR) 240 MG CR tablet Take 1 tablet (240 mg total) by mouth at bedtime. 90 tablet 3 Taking  . aspirin 81 MG chewable tablet Chew 81 mg by mouth daily.   Taking  . Multiple Vitamins-Minerals (PRESERVISION AREDS 2 PO) Take 1 capsule by mouth 2 (two) times daily.   Taking    ALLERGIES:   Allergies  Allergen Reactions  . Topamax [Topiramate] Other (See Comments)    blurred vision, tired, ringing in ears, restless, confussion blurred vision, tired, ringing in ears, restless, confussion  . Celebrex [Celecoxib]     unknown  . Crestor [Rosuvastatin Calcium]     myalgias  . Pitavastatin Rash    Per patient had a rash and a cough    REVIEW OF SYSTEMS:  A comprehensive review of systems was negative except for: Musculoskeletal: positive for arthralgias and bone pain   FAMILY HISTORY:   Family History  Problem Relation Age of Onset  . Heart attack Father   . Heart disease Father     SOCIAL HISTORY:   Social History   Tobacco Use  . Smoking status: Never Smoker  . Smokeless tobacco: Never Used  Substance Use Topics  . Alcohol use: Yes    Comment: 01/29/2016 "glass of wine q couple months"     EXAMINATION:  Vital signs in last 24 hours:    There were no vitals taken for this visit.  General Appearance:    Alert, cooperative, no distress, appears stated age  Head:    Normocephalic, without obvious abnormality, atraumatic  Eyes:    PERRL, conjunctiva/corneas clear, EOM's intact, fundi    benign, both eyes       Ears:    Normal TM's and external ear canals, both ears  Nose:   Nares normal, septum midline, mucosa normal, no drainage    or sinus tenderness  Throat:   Lips, mucosa, and tongue normal; teeth and gums normal  Neck:   Supple, symmetrical,  trachea midline, no adenopathy;       thyroid:  No enlargement/tenderness/nodules; no carotid   bruit or JVD  Back:     Symmetric, no curvature, ROM normal, no CVA tenderness  Lungs:     Clear to auscultation bilaterally, respirations unlabored  Chest wall:    No tenderness or deformity  Heart:    Regular rate and rhythm, S1 and S2 normal, no murmur, rub   or gallop  Abdomen:     Soft, non-tender, bowel sounds active all four quadrants,    no masses, no organomegaly  Genitalia:    Normal male without  lesion, discharge or tenderness  Rectal:    Normal tone, normal prostate, no masses or tenderness;   guaiac negative stool  Extremities:   Extremities normal, atraumatic, no cyanosis or edema  Pulses:   2+ and symmetric all extremities  Skin:   Skin color, texture, turgor normal, no rashes or lesions  Lymph nodes:   Cervical, supraclavicular, and axillary nodes normal  Neurologic:   CNII-XII intact. Normal strength, sensation and reflexes      throughout    Musculoskeletal:  ROM 0-120, Ligaments intact,  Imaging Review Plain radiographs demonstrate severe degenerative joint disease of the left knee. The overall alignment is neutral. The bone quality appears to be good for age and reported activity level.  Assessment/Plan: Primary osteoarthritis, left knee   The patient history, physical examination and imaging studies are consistent with advanced degenerative joint disease of the left knee. The patient has failed conservative treatment.  The clearance notes were reviewed.  After discussion with the patient it was felt that Total Knee Replacement was indicated. The procedure,  risks, and benefits of total knee arthroplasty were presented and reviewed. The risks including but not limited to aseptic loosening, infection, blood clots, vascular injury, stiffness, patella tracking problems complications among others were discussed. The patient acknowledged the explanation, agreed to proceed with the  plan.  Donia Ast 12/18/2017, 6:15 AM    Patient's anticipated LOS is less than 2 midnights, meeting these requirements: - Lives within 1 hour of care - Has a competent adult at home to recover with post-op recover - NO history of  - Chronic pain requiring opiods  - Diabetes  - Coronary Artery Disease  - Heart failure  - Heart attack  - Stroke  - DVT/VTE  - Cardiac arrhythmia  - Respiratory Failure/COPD  - Renal failure  - Anemia  - Advanced Liver disease

## 2017-12-18 NOTE — Anesthesia Procedure Notes (Signed)
Anesthesia Regional Block: Adductor canal block   Pre-Anesthetic Checklist: ,, timeout performed, Correct Patient, Correct Site, Correct Laterality, Correct Procedure, Correct Position, site marked, Risks and benefits discussed,  Surgical consent,  Pre-op evaluation,  At surgeon's request and post-op pain management  Laterality: Left  Prep: chloraprep       Needles:  Injection technique: Single-shot  Needle Type: Echogenic Needle     Needle Length: 9cm  Needle Gauge: 21     Additional Needles:   Procedures:,,,, ultrasound used (permanent image in chart),,,,  Narrative:  Start time: 12/18/2017 7:32 AM End time: 12/18/2017 7:38 AM Injection made incrementally with aspirations every 5 mL.  Performed by: Personally  Anesthesiologist: Suzette Battiest, MD

## 2017-12-18 NOTE — Transfer of Care (Signed)
Immediate Anesthesia Transfer of Care Note  Patient: Duane Reyes  Procedure(s) Performed: TOTAL KNEE ARTHROPLASTY (Left Knee)  Patient Location: PACU  Anesthesia Type:Spinal and MAC combined with regional for post-op pain  Level of Consciousness: drowsy and patient cooperative  Airway & Oxygen Therapy: Patient Spontanous Breathing and Patient connected to face mask oxygen  Post-op Assessment: Report given to RN and Post -op Vital signs reviewed and stable  Post vital signs: Reviewed and stable  Last Vitals:  Vitals Value Taken Time  BP 102/49 12/18/2017 10:33 AM  Temp 36.5 C 12/18/2017 10:33 AM  Pulse 61 12/18/2017 10:35 AM  Resp 18 12/18/2017 10:35 AM  SpO2 99 % 12/18/2017 10:35 AM  Vitals shown include unvalidated device data.  Last Pain:  Vitals:   12/18/17 0659  TempSrc:   PainSc: 2       Patients Stated Pain Goal: 3 (58/72/76 1848)  Complications: No apparent anesthesia complications

## 2017-12-18 NOTE — Anesthesia Postprocedure Evaluation (Signed)
Anesthesia Post Note  Patient: Duane Reyes  Procedure(s) Performed: TOTAL KNEE ARTHROPLASTY (Left Knee)     Patient location during evaluation: PACU Anesthesia Type: Spinal Level of consciousness: awake and alert Pain management: pain level controlled Vital Signs Assessment: post-procedure vital signs reviewed and stable Respiratory status: spontaneous breathing and respiratory function stable Cardiovascular status: blood pressure returned to baseline and stable Postop Assessment: spinal receding Anesthetic complications: no    Last Vitals:  Vitals:   12/18/17 1340 12/18/17 1355  BP: 99/64 97/67  Pulse: 61 (!) 58  Resp: 14 14  Temp: 36.5 C   SpO2: 98% 95%    Last Pain:  Vitals:   12/18/17 1340  TempSrc:   PainSc: Tyler Deis

## 2017-12-18 NOTE — Progress Notes (Signed)
Orthopedic Tech Progress Note Patient Details:  Duane Reyes Mar 16, 1951 [id number]  CPM Left Knee CPM Left Knee: On Left Knee Flexion (Degrees): 90 Left Knee Extension (Degrees): 0  Post Interventions Patient Tolerated: Well Instructions Provided: Care of device  Hildred Priest 12/18/2017, 11:02 AM

## 2017-12-18 NOTE — Evaluation (Signed)
Physical Therapy Evaluation Patient Details Name: Duane Reyes MRN: [id number] DOB: 10/13/1950 Today's Date: 12/18/2017   History of Present Illness  Pt is a 67 y/o male s/p elective L TKA. PMH includes CAD s/p stent placement, HTN, prostate cancer, and lumbar fusion.   Clinical Impression  Pt is s/p surgery above with deficits below. Pt with decreased sensation and R knee buckling in standing, therefore mobility limited to standing at EOB.  Required mod A for standing at EOB. Educated about knee precautions and supine HEP. Will continue to follow acutely to maximize functional mobility independence and safety.     Follow Up Recommendations Follow surgeon's recommendation for DC plan and follow-up therapies;Supervision for mobility/OOB    Equipment Recommendations  3in1 (PT)    Recommendations for Other Services OT consult     Precautions / Restrictions Precautions Precautions: Knee Precaution Booklet Issued: No Precaution Comments: Reviewed knee precautions and supine HEP.  Restrictions Weight Bearing Restrictions: Yes LLE Weight Bearing: Weight bearing as tolerated      Mobility  Bed Mobility Overal bed mobility: Needs Assistance Bed Mobility: Supine to Sit;Sit to Supine     Supine to sit: Min guard Sit to supine: Min assist   General bed mobility comments: Min guard for safety to come up to sitting. Min A for LLE assist for return to supine.   Transfers Overall transfer level: Needs assistance Equipment used: Rolling walker (2 wheeled) Transfers: Sit to/from Stand Sit to Stand: Mod assist         General transfer comment: Mod A for lift assist and steadying. Attempted X 2. Pt very unsteady and L knee buckling when attempting to put weight on LLE, therefore further mobility deferred.   Ambulation/Gait             General Gait Details: NT  Stairs            Wheelchair Mobility    Modified Rankin (Stroke Patients Only)       Balance  Overall balance assessment: Needs assistance Sitting-balance support: Feet supported;No upper extremity supported Sitting balance-[name] Scale: Good     Standing balance support: Bilateral upper extremity supported;During functional activity Standing balance-[name] Scale: Poor Standing balance comment: Reliant on heavy UE support.                              Pertinent Vitals/Pain Pain Assessment: 0-10 Pain Score: 5  Pain Location: L knee  Pain Descriptors / Indicators: Aching;Operative site guarding Pain Intervention(s): Limited activity within patient's tolerance;Monitored during session;Repositioned    Home Living Family/patient expects to be discharged to:: Private residence Living Arrangements: Spouse/significant other Available Help at Discharge: Family;Available 24 hours/day Type of Home: House Home Access: Stairs to enter Entrance Stairs-Rails: None Entrance Stairs-Number of Steps: 3 Home Layout: One level Home Equipment: Walker - 2 wheels      Prior Function Level of Independence: Independent               Hand Dominance        Extremity/Trunk Assessment   Upper Extremity Assessment Upper Extremity Assessment: Defer to OT evaluation    Lower Extremity Assessment Lower Extremity Assessment: LLE deficits/detail LLE Deficits / Details: Reports decreased sensation. Able to perform ther ex below. Deficits consistent with post op pain and weakness.     Cervical / Trunk Assessment Cervical / Trunk Assessment: Normal  Communication   Communication: No difficulties  Cognition Arousal/Alertness: Awake/alert Behavior During  Therapy: WFL for tasks assessed/performed Overall Cognitive Status: Within Functional Limits for tasks assessed                                        General Comments General comments (skin integrity, edema, etc.): Pt's wife present during session.     Exercises Total Joint Exercises Ankle Circles/Pumps:  AROM;Both;20 reps Quad Sets: AROM;Left;10 reps Heel Slides: AROM;Left;10 reps   Assessment/Plan    PT Assessment Patient needs continued PT services  PT Problem List Decreased strength;Decreased balance;Decreased mobility;Decreased coordination;Decreased knowledge of use of DME;Decreased knowledge of precautions;Impaired sensation;Pain       PT Treatment Interventions Gait training;DME instruction;Stair training;Functional mobility training;Therapeutic exercise;Therapeutic activities;Balance training;Neuromuscular re-education;Patient/family education    PT Goals (Current goals can be found in the Care Plan section)  Acute Rehab PT Goals Patient Stated Goal: to be able to walk  PT Goal Formulation: With patient Time For Goal Achievement: 01/01/18 Potential to Achieve Goals: Good    Frequency 7X/week   Barriers to discharge        Co-evaluation               AM-PAC PT "6 Clicks" Daily Activity  Outcome Measure Difficulty turning over in bed (including adjusting bedclothes, sheets and blankets)?: A Little Difficulty moving from lying on back to sitting on the side of the bed? : Unable Difficulty sitting down on and standing up from a chair with arms (e.g., wheelchair, bedside commode, etc,.)?: Unable Help needed moving to and from a bed to chair (including a wheelchair)?: A Lot Help needed walking in hospital room?: Total Help needed climbing 3-5 steps with a railing? : Total 6 Click Score: 9    End of Session Equipment Utilized During Treatment: Gait belt Activity Tolerance: Patient tolerated treatment well Patient left: in bed;with call bell/phone within reach;with family/visitor present Nurse Communication: Mobility status PT Visit Diagnosis: Unsteadiness on feet (R26.81);Other abnormalities of gait and mobility (R26.89);Other symptoms and signs involving the nervous system (R29.898);Pain Pain - Right/Left: Left Pain - part of body: Knee    Time: 4503-8882 PT  Time Calculation (min) (ACUTE ONLY): 34 min   Charges:   PT Evaluation $PT Eval Moderate Complexity: 1 Mod PT Treatments $Therapeutic Activity: 8-22 mins   PT G Codes:        Duane Reyes, PT, DPT  Acute Rehabilitation Services  Pager: [phone]   Duane Reyes 12/18/2017, 7:45 PM

## 2017-12-19 ENCOUNTER — Encounter (HOSPITAL_COMMUNITY): Payer: Self-pay | Admitting: Orthopedic Surgery

## 2017-12-19 DIAGNOSIS — M1712 Unilateral primary osteoarthritis, left knee: Secondary | ICD-10-CM | POA: Diagnosis not present

## 2017-12-19 MED ORDER — METHOCARBAMOL 500 MG PO TABS: 500.0000 mg | ORAL_TABLET | Freq: Four times a day (QID) | ORAL | 0 refills | Status: DC | PRN

## 2017-12-19 MED ORDER — ASPIRIN 81 MG PO CHEW: 81.0000 mg | CHEWABLE_TABLET | Freq: Two times a day (BID) | ORAL | 0 refills | Status: DC

## 2017-12-19 MED ORDER — OXYCODONE HCL 10 MG PO TABS: 10.0000 mg | ORAL_TABLET | ORAL | 0 refills | Status: DC | PRN

## 2017-12-19 MED ORDER — ACETAMINOPHEN 500 MG PO TABS: 1000.0000 mg | ORAL_TABLET | Freq: Four times a day (QID) | ORAL | 0 refills | Status: DC

## 2017-12-19 NOTE — Discharge Summary (Signed)
SPORTS MEDICINE & JOINT REPLACEMENT   Lara Mulch, MD   Carlyon Shadow, PA-C [place], [place], [place]  [address]                             [phone]  PATIENT ID: Duane Reyes        MRN:  [id number]          DOB/AGE: [birth year] / 67 y.o.    DISCHARGE SUMMARY  ADMISSION DATE:    12/18/2017 DISCHARGE DATE:   12/19/2017   ADMISSION DIAGNOSIS: primary osteoarthritis left knee    DISCHARGE DIAGNOSIS:  primary osteoarthritis left knee    ADDITIONAL DIAGNOSIS: Active Problems:   S/P total knee replacement  Past Medical History:  Diagnosis Date  . Arthritis    "left knee" (01/29/2016)  . 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 Bronchiolitis   . CAD in native artery    a. Abnl cardiac CT -> LHC 01/2016 s/p 50% mLAD, 25% OM2, 95% RPDA which was treated with overlapping DES.  [name] Chronic lower back pain   . Dyspnea   . Frequent PVCs    a. 12/2015 - Holter showed SB, NSR, ST, HR 54-108, with frequent PVCs in singles, couplets, and bigemy, with elevated PVC load 24%.  [name] GERD (gastroesophageal reflux disease)   . History of shingles   . Hyperlipidemia   . Hypertension   . Hypothyroidism   . Prostate cancer ([place])   . Tortuosity of aortic arch, -would not pursue Rt radial approach in this patient for future cardiac caths     PROCEDURE: Procedure(s): TOTAL KNEE ARTHROPLASTY on 12/18/2017  CONSULTS:    HISTORY:  See H&P in chart  HOSPITAL COURSE:  Duane Reyes is a 67 y.o. admitted on 12/18/2017 and found to have a diagnosis of primary osteoarthritis left knee.  After appropriate laboratory studies were obtained  they were taken to the operating room on 12/18/2017 and underwent Procedure(s): TOTAL KNEE ARTHROPLASTY.   They were given perioperative antibiotics:  Anti-infectives (From admission, onward)   Start     Dose/Rate Route Frequency Ordered Stop   12/18/17 1700  ceFAZolin (ANCEF) IVPB 2g/100 mL premix     2 g 200 mL/hr over 30 Minutes Intravenous Every 6  hours 12/18/17 1638 12/19/17 0019   12/18/17 0703  ceFAZolin (ANCEF) 2-4 GM/100ML-% IVPB    Note to Pharmacy:  Nyoka Cowden   : cabinet override      12/18/17 0703 12/18/17 0830   12/18/17 0655  ceFAZolin (ANCEF) IVPB 2g/100 mL premix     2 g 200 mL/hr over 30 Minutes Intravenous On call to O.R. 12/18/17 8546 12/18/17 2703    .  Patient given tranexamic acid IV or topical and exparel intra-operatively.  Tolerated the procedure well.    POD# 1: Vital signs were stable.  Patient denied Chest pain, shortness of breath, or calf pain.  Patient was started on Lovenox 30 mg subcutaneously twice daily at 8am.  Consults to PT, OT, and care management were made.  The patient was weight bearing as tolerated.  CPM was placed on the operative leg 0-90 degrees for 6-8 hours a day. When out of the CPM, patient was placed in the foam block to achieve full extension. Incentive spirometry was taught.  Dressing was changed.       POD #2, Continued  PT for ambulation and exercise program.  IV saline locked.  O2 discontinued.  The remainder of the hospital course was dedicated to ambulation and strengthening.   The patient was discharged on 1 Day Post-Op in  Good condition.  Blood products given:none  DIAGNOSTIC STUDIES: Recent vital signs:  Patient Vitals for the past 24 hrs:  BP Temp Temp src Pulse Resp SpO2  12/19/17 1000 133/87 (!) 97.5 F (36.4 C) Oral 85 14 99 %  12/19/17 0554 132/86 - - 84 18 98 %  12/18/17 2155 111/71 97.9 F (36.6 C) Oral 75 18 95 %  12/18/17 1633 108/71 97.6 F (36.4 C) Oral 65 17 93 %  12/18/17 1610 106/78 97.9 F (36.6 C) - 62 15 100 %  12/18/17 1540 115/74 - - 60 14 99 %  12/18/17 1525 97/74 - - 61 12 100 %  12/18/17 1510 104/67 - - 62 12 100 %  12/18/17 1355 97/67 - - (!) 58 14 95 %  12/18/17 1340 99/64 97.7 F (36.5 C) - 61 14 98 %  12/18/17 1310 100/65 - - (!) 59 15 95 %  12/18/17 1255 96/68 - - (!) 58 15 93 %  12/18/17 1240 102/63 - - (!) 54 13 97 %   12/18/17 1225 98/68 - - 60 15 95 %  12/18/17 1210 98/67 - - (!) 59 14 94 %       Recent laboratory studies: No results for input(s): WBC, HGB, HCT, PLT in the last 168 hours. No results for input(s): NA, K, CL, CO2, BUN, CREATININE, GLUCOSE, CALCIUM in the last 168 hours. Lab Results  Component Value Date   INR 0.96 01/27/2016   INR 1.07 04/30/2015   INR 1.16 04/29/2015     Recent Radiographic Studies :  Ct Chest High Resolution  Result Date: 12/11/2017 CLINICAL DATA:  Shortness of breath, bronchitis.  Cough. EXAM: CT CHEST WITHOUT CONTRAST TECHNIQUE: Multidetector CT imaging of the chest was performed following the standard protocol without intravenous contrast. High resolution imaging of the lungs, as well as inspiratory and expiratory imaging, was performed. COMPARISON:  01/13/2017 and 01/21/2016. FINDINGS: Cardiovascular: Atherosclerotic calcification of the arterial vasculature, including extensive three-vessel involvement of the coronary arteries. Heart is enlarged. No pericardial effusion. Mediastinum/Nodes: No pathologically enlarged mediastinal or axillary lymph nodes. Hilar regions are difficult to evaluate without IV contrast but appear grossly unremarkable. Esophagus is grossly unremarkable. Lungs/Pleura: Scattered subpleural nodules in the lung bases measure 5 mm or less in size, unchanged and likely benign subpleural lymph nodes. Mild subpleural scarring at the lung bases. No subpleural reticulation, traction bronchiectasis/bronchiolectasis, ground-glass, architectural distortion or honeycombing. 3 mm left upper lobe nodule (image 34), unchanged and considered benign. No pleural fluid. Airway is unremarkable. No air trapping. Upper Abdomen: Subcentimeter low-attenuation lesion in the right hepatic lobe is unchanged and likely benign. Visualized portions of the liver, gallbladder, adrenal glands, left kidney, spleen, pancreas, stomach and bowel are otherwise grossly unremarkable. No  upper abdominal adenopathy. Musculoskeletal: Degenerative changes and scoliosis in the spine. No worrisome lytic or sclerotic lesions. IMPRESSION: 1. Mild bibasilar subpleural scarring. No definitive evidence of interstitial lung disease. 2. Aortic atherosclerosis (ICD10-170.0). Extensive three-vessel coronary artery calcification. Electronically Signed   By: Lorin Picket M.D.   On: 12/11/2017 15:55    DISCHARGE INSTRUCTIONS: Discharge Instructions    CPM   Complete by:  As directed    Continuous passive motion machine (CPM):      Use the CPM from 0 to 90 for 4-6 hours per day.      You may increase by  10 per day.  You may break it up into 2 or 3 sessions per day.      Use CPM for 2 weeks or until you are told to stop.   Call MD / Call 911   Complete by:  As directed    If you experience chest pain or shortness of breath, CALL 911 and be transported to the hospital emergency room.  If you develope a fever above 101 F, pus (white drainage) or increased drainage or redness at the wound, or calf pain, call your surgeon's office.   Constipation Prevention   Complete by:  As directed    Drink plenty of fluids.  Prune juice may be helpful.  You may use a stool softener, such as Colace (over the counter) 100 mg twice a day.  Use MiraLax (over the counter) for constipation as needed.   Diet - low sodium heart healthy   Complete by:  As directed    Discharge instructions   Complete by:  As directed    INSTRUCTIONS AFTER JOINT REPLACEMENT   Remove items at home which could result in a fall. This includes throw rugs or furniture in walking pathways ICE to the affected joint every three hours while awake for 30 minutes at a time, for at least the first 3-5 days, and then as needed for pain and swelling.  Continue to use ice for pain and swelling. You may notice swelling that will progress down to the foot and ankle.  This is normal after surgery.  Elevate your leg when you are not up walking on it.    Continue to use the breathing machine you got in the hospital (incentive spirometer) which will help keep your temperature down.  It is common for your temperature to cycle up and down following surgery, especially at night when you are not up moving around and exerting yourself.  The breathing machine keeps your lungs expanded and your temperature down.   DIET:  As you were doing prior to hospitalization, we recommend a well-balanced diet.  DRESSING / WOUND CARE / SHOWERING  Keep the surgical dressing until follow up.  The dressing is water proof, so you can shower without any extra covering.  IF THE DRESSING FALLS OFF or the wound gets wet inside, change the dressing with sterile gauze.  Please use good hand washing techniques before changing the dressing.  Do not use any lotions or creams on the incision until instructed by your surgeon.    ACTIVITY  Increase activity slowly as tolerated, but follow the weight bearing instructions below.   No driving for 6 weeks or until further direction given by your physician.  You cannot drive while taking narcotics.  No lifting or carrying greater than 10 lbs. until further directed by your surgeon. Avoid periods of inactivity such as sitting longer than an hour when not asleep. This helps prevent blood clots.  You may return to work once you are authorized by your doctor.     WEIGHT BEARING   Weight bearing as tolerated with assist device (walker, cane, etc) as directed, use it as long as suggested by your surgeon or therapist, typically at least 4-6 weeks.   EXERCISES  Results after joint replacement surgery are often greatly improved when you follow the exercise, range of motion and muscle strengthening exercises prescribed by your doctor. Safety measures are also important to protect the joint from further injury. Any time any of these exercises cause you to have increased  pain or swelling, decrease what you are doing until you are comfortable  again and then slowly increase them. If you have problems or questions, call your caregiver or physical therapist for advice.   Rehabilitation is important following a joint replacement. After just a few days of immobilization, the muscles of the leg can become weakened and shrink (atrophy).  These exercises are designed to build up the tone and strength of the thigh and leg muscles and to improve motion. Often times heat used for twenty to thirty minutes before working out will loosen up your tissues and help with improving the range of motion but do not use heat for the first two weeks following surgery (sometimes heat can increase post-operative swelling).   These exercises can be done on a training (exercise) mat, on the floor, on a table or on a bed. Use whatever works the best and is most comfortable for you.    Use music or television while you are exercising so that the exercises are a pleasant break in your day. This will make your life better with the exercises acting as a break in your routine that you can look forward to.   Perform all exercises about fifteen times, three times per day or as directed.  You should exercise both the operative leg and the other leg as well.   Exercises include:   Quad Sets - Tighten up the muscle on the front of the thigh (Quad) and hold for 5-10 seconds.   Straight Leg Raises - With your knee straight (if you were given a brace, keep it on), lift the leg to 60 degrees, hold for 3 seconds, and slowly lower the leg.  Perform this exercise against resistance later as your leg gets stronger.  Leg Slides: Lying on your back, slowly slide your foot toward your buttocks, bending your knee up off the floor (only go as far as is comfortable). Then slowly slide your foot back down until your leg is flat on the floor again.  Angel Wings: Lying on your back spread your legs to the side as far apart as you can without causing discomfort.  Hamstring Strength:  Lying on your  back, push your heel against the floor with your leg straight by tightening up the muscles of your buttocks.  Repeat, but this time bend your knee to a comfortable angle, and push your heel against the floor.  You may put a pillow under the heel to make it more comfortable if necessary.   A rehabilitation program following joint replacement surgery can speed recovery and prevent re-injury in the future due to weakened muscles. Contact your doctor or a physical therapist for more information on knee rehabilitation.    CONSTIPATION  Constipation is defined medically as fewer than three stools per week and severe constipation as less than one stool per week.  Even if you have a regular bowel pattern at home, your normal regimen is likely to be disrupted due to multiple reasons following surgery.  Combination of anesthesia, postoperative narcotics, change in appetite and fluid intake all can affect your bowels.   YOU MUST use at least one of the following options; they are listed in order of increasing strength to get the job done.  They are all available over the counter, and you may need to use some, POSSIBLY even all of these options:    Drink plenty of fluids (prune juice may be helpful) and high fiber foods Colace 100 mg by mouth  twice a day  Senokot for constipation as directed and as needed Dulcolax (bisacodyl), take with full glass of water  Miralax (polyethylene glycol) once or twice a day as needed.  If you have tried all these things and are unable to have a bowel movement in the first 3-4 days after surgery call either your surgeon or your primary doctor.    If you experience loose stools or diarrhea, hold the medications until you stool forms back up.  If your symptoms do not get better within 1 week or if they get worse, check with your doctor.  If you experience "the worst abdominal pain ever" or develop nausea or vomiting, please contact the office immediately for further  recommendations for treatment.   ITCHING:  If you experience itching with your medications, try taking only a single pain pill, or even half a pain pill at a time.  You can also use Benadryl over the counter for itching or also to help with sleep.   TED HOSE STOCKINGS:  Use stockings on both legs until for at least 2 weeks or as directed by physician office. They may be removed at night for sleeping.  MEDICATIONS:  See your medication summary on the "After Visit Summary" that nursing will review with you.  You may have some home medications which will be placed on hold until you complete the course of blood thinner medication.  It is important for you to complete the blood thinner medication as prescribed.  PRECAUTIONS:  If you experience chest pain or shortness of breath - call 911 immediately for transfer to the hospital emergency department.   If you develop a fever greater that 101 F, purulent drainage from wound, increased redness or drainage from wound, foul odor from the wound/dressing, or calf pain - CONTACT YOUR SURGEON.                                                   FOLLOW-UP APPOINTMENTS:  If you do not already have a post-op appointment, please call the office for an appointment to be seen by your surgeon.  Guidelines for how soon to be seen are listed in your "After Visit Summary", but are typically between 1-4 weeks after surgery.  OTHER INSTRUCTIONS:   Knee Replacement:  Do not place pillow under knee, focus on keeping the knee straight while resting. CPM instructions: 0-90 degrees, 2 hours in the morning, 2 hours in the afternoon, and 2 hours in the evening. Place foam block, curve side up under heel at all times except when in CPM or when walking.  DO NOT modify, tear, cut, or change the foam block in any way.  MAKE SURE YOU:  Understand these instructions.  Get help right away if you are not doing well or get worse.    Thank you for letting us be a part of your medical  care team.  It is a privilege we respect greatly.  We hope these instructions will help you stay on track for a fast and full recovery!   Increase activity slowly as tolerated   Complete by:  As directed       DISCHARGE MEDICATIONS:   Allergies as of 12/19/2017      Reactions   Topamax [topiramate] Other (See Comments)   blurred vision, tired, ringing in ears, restless, confussion blurred vision,  tired, ringing in ears, restless, confussion   Celebrex [celecoxib]    unknown   Crestor [rosuvastatin Calcium]    myalgias   Pitavastatin Rash   Per patient had a rash and a cough      Medication List    TAKE these medications   acetaminophen 500 MG tablet Commonly known as:  TYLENOL Take 2 tablets (1,000 mg total) by mouth every 6 (six) hours. What changed:    medication strength  how much to take  when to take this  reasons to take this   aspirin 81 MG chewable tablet Chew 1 tablet (81 mg total) by mouth 2 (two) times daily. What changed:  when to take this   atorvastatin 40 MG tablet Commonly known as:  LIPITOR Take 1 tablet (40 mg total) by mouth daily at 6 PM.   clopidogrel 75 MG tablet Commonly known as:  PLAVIX Take 1 tablet (75 mg total) by mouth daily with breakfast.   Co Q 10 100 MG Caps Take 1 capsule by mouth daily.   levothyroxine 112 MCG tablet Commonly known as:  SYNTHROID, LEVOTHROID Take 112 mcg by mouth daily before breakfast.   methocarbamol 500 MG tablet Commonly known as:  ROBAXIN Take 1-2 tablets (500-1,000 mg total) by mouth every 6 (six) hours as needed for muscle spasms.   mexiletine 150 MG capsule Commonly known as:  MEXITIL Take 1 capsule (150 mg total) by mouth 2 (two) times daily.   nitroGLYCERIN 0.4 MG SL tablet Commonly known as:  NITROSTAT Place 0.4 mg under the tongue every 5 (five) minutes as needed for chest pain (x 3 doses).   Oxycodone HCl 10 MG Tabs Take 1 tablet (10 mg total) by mouth every 4 (four) hours as needed for  moderate pain (pain score 4-6).   pantoprazole 40 MG tablet Commonly known as:  PROTONIX Take 1 tablet (40 mg total) by mouth daily.   PRESERVISION AREDS 2 PO Take 1 capsule by mouth 2 (two) times daily.   ranitidine 150 MG capsule Commonly known as:  ZANTAC Take 1 capsule by mouth 2 (two) times daily.   verapamil 240 MG CR tablet Commonly known as:  CALAN-SR Take 1 tablet (240 mg total) by mouth at bedtime.            Durable Medical Equipment  (From admission, onward)        Start     Ordered   12/18/17 1639  DME Walker rolling  Once    Question:  Patient needs a walker to treat with the following condition  Answer:  S/P total knee replacement   12/18/17 1638   12/18/17 1639  DME 3 n 1  Once     12/18/17 1638   12/18/17 1639  DME Bedside commode  Once    Question:  Patient needs a bedside commode to treat with the following condition  Answer:  S/P total knee replacement   12/18/17 1638      FOLLOW UP VISIT:    DISPOSITION: HOME VS. SNF  CONDITION:  Good   Donia Ast 12/19/2017, 12:05 PM

## 2017-12-19 NOTE — Evaluation (Signed)
Occupational Therapy Evaluation Patient Details Name: Duane Reyes MRN: [id number] DOB: 12/27/1950 Today's Date: 12/19/2017    History of Present Illness Pt is a 67 y/o male s/p elective L TKA. PMH includes CAD s/p stent placement, HTN, prostate cancer, and lumbar fusion.    Clinical Impression   Pt is at min A level with LB ADLs, min guard A with grooming standing at sink and with ADL transfers using DME. Pt will have 24/7 assist at home from wife and has all necessary DME for previous ortho surgeries. All education completed and no further acute OT is indicated at this time    Follow Up Recommendations  No OT follow up;Supervision - Intermittent    Equipment Recommendations  None recommended by OT    Recommendations for Other Services       Precautions / Restrictions Precautions Precautions: Knee Precaution Booklet Issued: No Precaution Comments: Reviewed knee precautions  Restrictions Weight Bearing Restrictions: Yes LLE Weight Bearing: Weight bearing as tolerated      Mobility Bed Mobility               General bed mobility comments: pt up in recliner upon OT arrival  Transfers Overall transfer level: Needs assistance Equipment used: Rolling walker (2 wheeled) Transfers: Sit to/from Stand Sit to Stand: Min guard              Balance Overall balance assessment: Needs assistance Sitting-balance support: Feet supported;No upper extremity supported Sitting balance-[name] Scale: Good     Standing balance support: Bilateral upper extremity supported;During functional activity Standing balance-[name] Scale: Poor                             ADL either performed or assessed with clinical judgement   ADL                                         General ADL Comments: min A with LB ADLs, min guard with transfers and standing at sink for grooming     Vision Baseline Vision/History: Wears glasses Wears Glasses: Reading  only Patient Visual Report: No change from baseline       Perception     Praxis      Pertinent Vitals/Pain Pain Assessment: 0-10 Pain Score: 3  Pain Descriptors / Indicators: Aching;Operative site guarding Pain Intervention(s): Monitored during session;Premedicated before session;Repositioned     Hand Dominance Right   Extremity/Trunk Assessment Upper Extremity Assessment Upper Extremity Assessment: Overall WFL for tasks assessed   Lower Extremity Assessment Lower Extremity Assessment: Defer to PT evaluation   Cervical / Trunk Assessment Cervical / Trunk Assessment: Normal   Communication Communication Communication: No difficulties   Cognition Arousal/Alertness: Awake/alert Behavior During Therapy: WFL for tasks assessed/performed Overall Cognitive Status: Within Functional Limits for tasks assessed                                     General Comments       Exercises     Shoulder Instructions      Home Living Family/patient expects to be discharged to:: Private residence Living Arrangements: Spouse/significant other Available Help at Discharge: Family;Available 24 hours/day Type of Home: House Home Access: Stairs to enter [employer] of Steps: 3 Entrance Stairs-Rails: None Home Layout: One  level     Bathroom Shower/Tub: Occupational psychologist: Standard     Home Equipment: Environmental consultant - 2 wheels;Adaptive equipment;Bedside commode;Shower Theme park manager: Reacher        Prior Functioning/Environment Level of Independence: Independent                 OT Problem List: Decreased activity tolerance;Decreased knowledge of use of DME or AE;Impaired balance (sitting and/or standing);Pain      OT Treatment/Interventions:      OT Goals(Current goals can be found in the care plan section) Acute Rehab OT Goals Patient Stated Goal: to be able to walk  OT Goal Formulation: With patient/family Potential to  Achieve Goals: Good  OT Frequency:     Barriers to D/C:    no barriers       Co-evaluation              AM-PAC PT "6 Clicks" Daily Activity     Outcome Measure Help from another person eating meals?: None Help from another person taking care of personal grooming?: A Little   Help from another person bathing (including washing, rinsing, drying)?: A Little Help from another person to put on and taking off regular upper body clothing?: None Help from another person to put on and taking off regular lower body clothing?: A Little 6 Click Score: 17   End of Session Equipment Utilized During Treatment: Rolling walker;Other (comment)(3 in 1) CPM Left Knee CPM Left Knee: Off  Activity Tolerance: Patient tolerated treatment well Patient left: in chair;with call bell/phone within reach;with family/visitor present  OT Visit Diagnosis: Other abnormalities of gait and mobility (R26.89);Pain Pain - Right/Left: Left Pain - part of body: Knee                Time: 1324-4010 OT Time Calculation (min): 32 min Charges:  OT General Charges $OT Visit: 1 Visit OT Evaluation $OT Eval Moderate Complexity: 1 Mod OT Treatments $Therapeutic Activity: 8-22 mins G-Codes: OT G-codes **NOT FOR INPATIENT CLASS** Functional Assessment Tool Used: AM-PAC 6 Clicks Daily Activity     Britt Bottom 12/19/2017, 1:05 PM

## 2017-12-19 NOTE — Op Note (Signed)
TOTAL KNEE REPLACEMENT OPERATIVE NOTE:  12/18/2017  11:47 AM  PATIENT:  Duane Reyes  67 y.o. male  PRE-OPERATIVE DIAGNOSIS:  primary osteoarthritis left knee  POST-OPERATIVE DIAGNOSIS:  primary osteoarthritis left knee  PROCEDURE:  Procedure(s): TOTAL KNEE ARTHROPLASTY  SURGEON:  Surgeon(s): Vickey Huger, MD  PHYSICIAN ASSISTANT:Blair Mancel Bale, [facility]  ANESTHESIA:   spinal  DRAINS: Hemovac  SPECIMEN: None  COUNTS:  Correct  TOURNIQUET:   Total Tourniquet Time Documented: Thigh (Left) - 45 minutes Total: Thigh (Left) - 45 minutes   DICTATION:  Indication for procedure:    The patient is a 67 y.o. male who has failed conservative treatment for primary osteoarthritis left knee.  Informed consent was obtained prior to anesthesia. The risks versus benefits of the operation were explain and in a way the patient can, and did, understand.   On the implant demand matching protocol, this patient scored 8.  Therefore, this patient was not receive a polyethylene insert with vitamin E which is a high demand implant.  Description of procedure:     The patient was taken to the operating room and placed under anesthesia.  The patient was positioned in the usual fashion taking care that all body parts were adequately padded and/or protected.  I foley catheter was not placed.  A tourniquet was applied and the leg prepped and draped in the usual sterile fashion.  The extremity was exsanguinated with the esmarch and tourniquet inflated to 350 mmHg.  Pre-operative range of motion was normal.  The knee was in 5 degree of mild varus.  A midline incision approximately 6-7 inches long was made with a #10 blade.  A new blade was used to make a parapatellar arthrotomy going 2-3 cm into the quadriceps tendon, over the patella, and alongside the medial aspect of the patellar tendon.  A synovectomy was then performed with the #10 blade and forceps. I then elevated the deep MCL off the medial tibial  metaphysis subperiosteally around to the semimembranosus attachment.    I everted the patella and used calipers to measure patellar thickness.  I used the reamer to ream down to appropriate thickness to recreate the native thickness.  I then removed excess bone with the rongeur and sagittal saw.  I used the appropriately sized template and drilled the three lug holes.  I then put the trial in place and measured the thickness with the calipers to ensure recreation of the native thickness.  The trial was then removed and the patella subluxed and the knee brought into flexion.  A homan retractor was place to retract and protect the patella and lateral structures.  A Z-retractor was place medially to protect the medial structures.  The extra-medullary alignment system was used to make cut the tibial articular surface perpendicular to the anamotic axis of the tibia and in 3 degrees of posterior slope.  The cut surface and alignment jig was removed.  I then used the intramedullary alignment guide to make a 6 valgus cut on the distal femur.  I then marked out the epicondylar axis on the distal femur.  The posterior condylar axis measured 5 degrees.  I then used the anterior referencing sizer and measured the femur to be a size 8.  The 4-In-1 cutting block was screwed into place in external rotation matching the posterior condylar angle, making our cuts perpendicular to the epicondylar axis.  Anterior, posterior and chamfer cuts were made with the sagittal saw.  The cutting block and cut pieces were removed.  A lamina spreader was placed in 90 degrees of flexion.  The ACL, PCL, menisci, and posterior condylar osteophytes were removed.  A 14 mm spacer blocked was found to offer good flexion and extension gap balance after mild in degree releasing.   The scoop retractor was then placed and the femoral finishing block was pinned in place.  The small sagittal saw was used as well as the lug drill to finish the femur.   The block and cut surfaces were removed and the medullary canal hole filled with autograft bone from the cut pieces.  The tibia was delivered forward in deep flexion and external rotation.  A size E tray was selected and pinned into place centered on the medial 1/3 of the tibial tubercle.  The reamer and keel was used to prepare the tibia through the tray.    I then trialed with the size 8 femur, size E tibia, a 14 mm insert and the 35 patella.  I had excellent flexion/extension gap balance, excellent patella tracking.  Flexion was full and beyond 120 degrees; extension was zero.  These components were chosen and the staff opened them to me on the back table while the knee was lavaged copiously and the cement mixed.  The soft tissue was infiltrated with 60cc of exparel 1.3% through a 21 gauge needle.  I cemented in the components and removed all excess cement.  The polyethylene tibial component was snapped into place and the knee placed in extension while cement was hardening.  The capsule was infilltrated with 30cc of .25% Marcaine with epinephrine.  A hemovac was place in the joint exiting superolaterally.  A pain pump was place superomedially superficial to the arthrotomy.  Once the cement was hard, the tourniquet was let down.  Hemostasis was obtained.  The arthrotomy was closed with figure-8 #1 vicryl sutures.  The deep soft tissues were closed with #0 vicryls and the subcuticular layer closed with a running #2-0 vicryl.  The skin was reapproximated and closed with skin staples.  The wound was dressed with xeroform, 4 x4's, 2 ABD sponges, a single layer of webril and a TED stocking.   The patient was then awakened, extubated, and taken to the recovery room in stable condition.  BLOOD LOSS:  300cc DRAINS: 1 hemovac, 1 pain catheter COMPLICATIONS:  None.  PLAN OF CARE: Admit for overnight observation  PATIENT DISPOSITION:  PACU - hemodynamically stable.   Delay start of Pharmacological VTE agent  (>24hrs) due to surgical blood loss or risk of bleeding:  not applicable  Please fax a copy of this op note to my office at [phone] (please only include page 1 and 2 of the Case Information op note)

## 2017-12-19 NOTE — Progress Notes (Signed)
Physical Therapy Treatment Patient Details Name: Duane Reyes MRN: [id number] DOB: 11-Dec-1950 Today's Date: 12/19/2017    History of Present Illness Pt is a 67 y/o male s/p elective L TKA. PMH includes CAD s/p stent placement, HTN, prostate cancer, and lumbar fusion.     PT Comments    Today's session focused on gait and stair training.  Pt tolerated session well.  He was [employer] for transfers and mobility.  Pt was MinA during stair training but had 3 episodes of LOB requiring Mod A to correct.  He required VC's for proper technique and then demonstrated technique w/o cues.  Reviewed HEP and knee precautions with pt and his wife.  Plan to continue POC with emphasis on increasing activity tolerance and stair safety.       Follow Up Recommendations  Follow surgeon's recommendation for DC plan and follow-up therapies;Supervision for mobility/OOB     Equipment Recommendations  3in1 (PT)    Recommendations for Other Services OT consult     Precautions / Restrictions Precautions Precautions: Knee Precaution Booklet Issued: No Precaution Comments: Reviewed knee precautions  Restrictions Weight Bearing Restrictions: Yes LLE Weight Bearing: Weight bearing as tolerated    Mobility  Bed Mobility Overal bed mobility: Needs Assistance             General bed mobility comments: Pt sitting in chair on arrival.  Transfers Overall transfer level: Needs assistance Equipment used: Rolling walker (2 wheeled) Transfers: Sit to/from Stand Sit to Stand: Min guard         General transfer comment: Min guard for safety.    Ambulation/Gait Ambulation/Gait assistance: Min guard Ambulation Distance (Feet): 150 Feet Assistive device: Rolling walker (2 wheeled) Gait Pattern/deviations: Step-through pattern;Decreased stance time - left;Decreased dorsiflexion - left Gait velocity: Reduced   General Gait Details: Cues for upper trunk and neck control.   Cues for left heel strike.     Stairs Stairs: Yes Stairs assistance: Min assist Stair Management: One rail Right Number of Stairs: 4 General stair comments: 3x stair trials of 4 steps each.  Pt LOBx3 requiring ModA to correct.  LOB due to legs getting weak and buckling.  Pt can continue training once LOB is corrected.     Wheelchair Mobility    Modified Rankin (Stroke Patients Only)       Balance Overall balance assessment: Needs assistance Sitting-balance support: Feet supported;No upper extremity supported Sitting balance-[name] Scale: Good     Standing balance support: Bilateral upper extremity supported;During functional activity Standing balance-[name] Scale: Poor Standing balance comment: Reliant on heavy UE support.                             Cognition Arousal/Alertness: Awake/alert Behavior During Therapy: WFL for tasks assessed/performed Overall Cognitive Status: Within Functional Limits for tasks assessed                                        Exercises Total Joint Exercises Ankle Circles/Pumps: AROM;Both;20 reps;Seated Quad Sets: AROM;Left;10 reps;Supine Towel Squeeze: AROM;Both;10 reps;Supine Short Arc Quad: AROM;Left;10 reps;Seated Heel Slides: AROM;Left;10 reps;Seated;AAROM(1 set of AROM followed by 1 set of AAROM) Hip ABduction/ADduction: AROM;10 reps;Left;Seated Straight Leg Raises: AROM;Left;10 reps;Seated Long Arc Quad: AROM;Left;10 reps;Seated Goniometric ROM: 6-79 degrees L knee.      General Comments General comments (skin integrity, edema, etc.): Pt's wife present during  session.        Pertinent Vitals/Pain Pain Assessment: 0-10 Pain Score: 3  Pain Location: L knee  Pain Descriptors / Indicators: Aching;Operative site guarding Pain Intervention(s): Limited activity within patient's tolerance;Monitored during session;Repositioned;Ice applied    Home Living                      Prior Function            PT Goals (current  goals can now be found in the care plan section) Acute Rehab PT Goals Patient Stated Goal: to be able to walk  PT Goal Formulation: With patient Time For Goal Achievement: 01/01/18 Potential to Achieve Goals: Good Progress towards PT goals: Progressing toward goals    Frequency    7X/week      PT Plan Current plan remains appropriate    Co-evaluation              AM-PAC PT "6 Clicks" Daily Activity  Outcome Measure  Difficulty turning over in bed (including adjusting bedclothes, sheets and blankets)?: A Little Difficulty moving from lying on back to sitting on the side of the bed? : Unable Difficulty sitting down on and standing up from a chair with arms (e.g., wheelchair, bedside commode, etc,.)?: Unable Help needed moving to and from a bed to chair (including a wheelchair)?: A Little Help needed walking in hospital room?: A Little Help needed climbing 3-5 steps with a railing? : A Lot 6 Click Score: 13    End of Session Equipment Utilized During Treatment: Gait belt Activity Tolerance: Patient tolerated treatment well Patient left: with call bell/phone within reach;with family/visitor present;in chair Nurse Communication: Mobility status PT Visit Diagnosis: Unsteadiness on feet (R26.81);Other abnormalities of gait and mobility (R26.89);Other symptoms and signs involving the nervous system (R29.898);Pain Pain - Right/Left: Left Pain - part of body: Knee     Time: 7893-8101 PT Time Calculation (min) (ACUTE ONLY): 26 min  Charges:  $Gait Training: 8-22 mins $Therapeutic Exercise: 8-22 mins                    G Codes:       Terri Skains, SPTA [phone]    Terri Skains 12/19/2017, 5:21 PM

## 2017-12-19 NOTE — Plan of Care (Signed)

## 2017-12-19 NOTE — Progress Notes (Signed)
Physical Therapy Treatment Patient Details Name: Duane Reyes MRN: [id number] DOB: 1950/09/15 Today's Date: 12/19/2017    History of Present Illness Pt is a 67 y/o male s/p elective L TKA. PMH includes CAD s/p stent placement, HTN, prostate cancer, and lumbar fusion.     PT Comments    Pt performed gait and functional mobility with improved ability.  No longer complains of numbness and no buckling present.  Pt tolerated session well, will f/u in pm for stair training.  Plan for d/c home this pm.     Follow Up Recommendations  Follow surgeon's recommendation for DC plan and follow-up therapies;Supervision for mobility/OOB     Equipment Recommendations  3in1 (PT)    Recommendations for Other Services       Precautions / Restrictions Precautions Precautions: Knee Precaution Booklet Issued: No Precaution Comments: Reviewed knee precautions  Restrictions Weight Bearing Restrictions: Yes LLE Weight Bearing: Weight bearing as tolerated    Mobility  Bed Mobility               General bed mobility comments: Pt sitting on edge of bed on arrival.    Transfers Overall transfer level: Needs assistance Equipment used: Rolling walker (2 wheeled) Transfers: Sit to/from Stand Sit to Stand: Min guard         General transfer comment: Cues for hand placement to and from seated surface.    Ambulation/Gait Ambulation/Gait assistance: Min guard Ambulation Distance (Feet): 100 Feet Assistive device: Rolling walker (2 wheeled) Gait Pattern/deviations: Step-through pattern;Decreased stance time - left;Decreased dorsiflexion - left     General Gait Details: Cues for L heel strike, progression to step through pattern and cues for upper trunk control.     Stairs             Wheelchair Mobility    Modified Rankin (Stroke Patients Only)       Balance Overall balance assessment: Needs assistance Sitting-balance support: Feet supported;No upper extremity  supported Sitting balance-[name] Scale: Good     Standing balance support: Bilateral upper extremity supported;During functional activity Standing balance-[name] Scale: Poor                              Cognition Arousal/Alertness: Awake/alert Behavior During Therapy: WFL for tasks assessed/performed Overall Cognitive Status: Within Functional Limits for tasks assessed                                        Exercises Total Joint Exercises Ankle Circles/Pumps: AROM;Both;20 reps;Supine Quad Sets: AROM;Left;10 reps;Supine Towel Squeeze: AROM;Both;10 reps;Supine Short Arc Quad: AROM;Left;10 reps;Supine Heel Slides: AROM;Left;10 reps;Supine Hip ABduction/ADduction: AROM;10 reps;Left;Supine Straight Leg Raises: AROM;Left;10 reps;Supine Long Arc Quad: AROM;Left;10 reps;Seated Goniometric ROM: 6-79 degrees L knee.      General Comments        Pertinent Vitals/Pain Pain Assessment: 0-10 Pain Score: 5  Pain Descriptors / Indicators: Aching;Operative site guarding Pain Intervention(s): Monitored during session;Repositioned    Home Living Family/patient expects to be discharged to:: Private residence Living Arrangements: Spouse/significant other Available Help at Discharge: Family;Available 24 hours/day Type of Home: House Home Access: Stairs to enter Entrance Stairs-Rails: None Home Layout: One level Home Equipment: Environmental consultant - 2 wheels;Adaptive equipment;Bedside commode;Shower seat      Prior Function Level of Independence: Independent          PT Goals (current goals can  now be found in the care plan section) Acute Rehab PT Goals Patient Stated Goal: to be able to walk  Potential to Achieve Goals: Good Progress towards PT goals: Progressing toward goals    Frequency    7X/week      PT Plan Current plan remains appropriate    Co-evaluation              AM-PAC PT "6 Clicks" Daily Activity  Outcome Measure  Difficulty turning  over in bed (including adjusting bedclothes, sheets and blankets)?: A Little Difficulty moving from lying on back to sitting on the side of the bed? : Unable Difficulty sitting down on and standing up from a chair with arms (e.g., wheelchair, bedside commode, etc,.)?: Unable Help needed moving to and from a bed to chair (including a wheelchair)?: A Little Help needed walking in hospital room?: A Little Help needed climbing 3-5 steps with a railing? : A Little 6 Click Score: 14    End of Session Equipment Utilized During Treatment: Gait belt Activity Tolerance: Patient tolerated treatment well Patient left: in bed;with call bell/phone within reach;with family/visitor present Nurse Communication: Mobility status PT Visit Diagnosis: Unsteadiness on feet (R26.81);Other abnormalities of gait and mobility (R26.89);Other symptoms and signs involving the nervous system (R29.898);Pain Pain - Right/Left: Left Pain - part of body: Knee     Time: 9833-8250 PT Time Calculation (min) (ACUTE ONLY): 32 min  Charges:  $Gait Training: 8-22 mins $Therapeutic Exercise: 8-22 mins                    G Codes:       Governor Rooks, PTA pager [phone]    Cristela Blue 12/19/2017, 2:27 PM

## 2017-12-19 NOTE — Progress Notes (Signed)
Discharge instructions, RX's and follow up appt explained and provided to patient and wife verbalized understanding.   Azavion Bouillon, Tivis Ringer, RN

## 2017-12-19 NOTE — Progress Notes (Signed)
SPORTS MEDICINE AND JOINT REPLACEMENT  Lara Mulch, MD    Carlyon Shadow, PA-C [address], [place], [place]  [address]                             [phone]   PROGRESS NOTE  Subjective:  negative for Chest Pain  negative for Shortness of Breath  negative for Nausea/Vomiting   negative for Calf Pain  negative for Bowel Movement   Tolerating Diet: yes         Patient reports pain as 3 on 0-10 scale.    Objective: Vital signs in last 24 hours:    Patient Vitals for the past 24 hrs:  BP Temp Temp src Pulse Resp SpO2  12/19/17 0554 132/86 - - 84 18 98 %  12/18/17 2155 111/71 97.9 F (36.6 C) Oral 75 18 95 %  12/18/17 1633 108/71 97.6 F (36.4 C) Oral 65 17 93 %  12/18/17 1610 106/78 97.9 F (36.6 C) - 62 15 100 %  12/18/17 1540 115/74 - - 60 14 99 %  12/18/17 1525 97/74 - - 61 12 100 %  12/18/17 1510 104/67 - - 62 12 100 %  12/18/17 1355 97/67 - - (!) 58 14 95 %  12/18/17 1340 99/64 97.7 F (36.5 C) - 61 14 98 %  12/18/17 1310 100/65 - - (!) 59 15 95 %  12/18/17 1255 96/68 - - (!) 58 15 93 %  12/18/17 1240 102/63 - - (!) 54 13 97 %  12/18/17 1225 98/68 - - 60 15 95 %  12/18/17 1210 98/67 - - (!) 59 14 94 %  12/18/17 1155 98/62 - - (!) 54 12 100 %  12/18/17 1118 92/63 - - (!) 51 16 97 %  12/18/17 1103 (!) 92/58 - - (!) 53 13 97 %  12/18/17 1048 93/66 - - 66 15 95 %  12/18/17 1033 (!) 102/49 97.7 F (36.5 C) - 64 18 96 %  12/18/17 0750 102/85 - - 66 17 99 %  12/18/17 0745 113/68 - - 68 14 96 %  12/18/17 0740 123/71 - - 66 13 98 %  12/18/17 0735 126/75 - - 69 18 97 %    @flow {1959:LAST@   Intake/Output from previous day:   04/22 0701 - 04/23 0700 In: 1710 [I.V.:1500] Out: 1420 [Urine:1400]   Intake/Output this shift:   No intake/output data recorded.   Intake/Output      04/22 0701 - 04/23 0700 04/23 0701 - 04/24 0700   I.V. (mL/kg) 1500 (18.4)    IV Piggyback 210    Total Intake(mL/kg) 1710 (21)    Urine (mL/kg/hr) 1400 (0.7)    Blood 20    Total Output 1420    Net +290            LABORATORY DATA: No results for input(s): WBC, HGB, HCT, PLT in the last 168 hours. No results for input(s): NA, K, CL, CO2, BUN, CREATININE, GLUCOSE, CALCIUM in the last 168 hours. Lab Results  Component Value Date   INR 0.96 01/27/2016   INR 1.07 04/30/2015   INR 1.16 04/29/2015    Examination:  General appearance: alert, cooperative and no distress Extremities: extremities normal, atraumatic, no cyanosis or edema  Wound Exam: clean, dry, intact   Drainage:  None: wound tissue dry  Motor Exam: Quadriceps and Hamstrings Intact  Sensory Exam: Superficial Peroneal, Deep Peroneal and Tibial normal  Assessment:    1 Day Post-Op  Procedure(s) (LRB): TOTAL KNEE ARTHROPLASTY (Left)  ADDITIONAL DIAGNOSIS:  Active Problems:   S/P total knee replacement     Plan: Physical Therapy as ordered Weight Bearing as Tolerated (WBAT)  DVT Prophylaxis:  Aspirin  DISCHARGE PLAN: Home  DISCHARGE NEEDS: HHPT   Patient progressing well, expected D/C home today   Patient's anticipated LOS is less than 2 midnights, meeting these requirements: - Lives within 1 hour of care - Has a competent adult at home to recover with post-op recover - NO history of  - Chronic pain requiring opiods  - Diabetes  - Coronary Artery Disease  - Heart failure  - Heart attack  - Stroke  - DVT/VTE  - Cardiac arrhythmia  - Respiratory Failure/COPD  - Renal failure  - Anemia  - Advanced Liver disease              Donia Ast 12/19/2017, 7:11 AM

## 2017-12-19 NOTE — Plan of Care (Signed)

## 2017-12-20 NOTE — Care Management Note (Signed)
Case Management Note  Patient Details  Name: Duane Reyes MRN: [id number] Date of Birth: 03-Nov-1950  Subjective/Objective:  67  Yr old male s/p left total knee arthroplasty.                Action/Plan: Case manager spoke with patient concerning discharge plan. Patient was preoperatively setup with [facility]. Will have family support at discharge.   Expected Discharge Date:  12/19/17               Expected Discharge Plan:  [place]  In-House Referral:  NA  Discharge planning Services  CM Consult  Post Acute Care Choice:  Home Health, Durable Medical Equipment Choice offered to:  Patient  DME Arranged:  3-N-1, Walker rolling, Hospice Equipment Package D, CPM DME Agency:  [employer]  HH Arranged:  PT [place]:  [facility] [employer]  Status of Service:  Completed, signed off  If discussed at [employer], dates discussed:    Additional Comments:  Ninfa Meeker, RN 12/20/2017, 7:50 AM

## 2018-01-25 ENCOUNTER — Encounter (INDEPENDENT_AMBULATORY_CARE_PROVIDER_SITE_OTHER): Payer: Medicare HMO | Admitting: Ophthalmology

## 2018-01-25 DIAGNOSIS — I10 Essential (primary) hypertension: Secondary | ICD-10-CM | POA: Diagnosis not present

## 2018-01-25 DIAGNOSIS — H353122 Nonexudative age-related macular degeneration, left eye, intermediate dry stage: Secondary | ICD-10-CM

## 2018-01-25 DIAGNOSIS — H59032 Cystoid macular edema following cataract surgery, left eye: Secondary | ICD-10-CM | POA: Diagnosis not present

## 2018-01-25 DIAGNOSIS — H35033 Hypertensive retinopathy, bilateral: Secondary | ICD-10-CM | POA: Diagnosis not present

## 2018-01-25 DIAGNOSIS — H35372 Puckering of macula, left eye: Secondary | ICD-10-CM

## 2018-01-25 DIAGNOSIS — H353111 Nonexudative age-related macular degeneration, right eye, early dry stage: Secondary | ICD-10-CM | POA: Diagnosis not present

## 2018-01-25 DIAGNOSIS — H43813 Vitreous degeneration, bilateral: Secondary | ICD-10-CM | POA: Diagnosis not present

## 2018-03-30 ENCOUNTER — Other Ambulatory Visit: Payer: Self-pay | Admitting: Cardiology

## 2018-03-30 MED ORDER — VERAPAMIL HCL ER 240 MG PO TBCR: 240.0000 mg | EXTENDED_RELEASE_TABLET | Freq: Every day | ORAL | 2 refills | Status: DC

## 2018-03-30 NOTE — Telephone Encounter (Signed)
Order Providers   Prescribing Provider Encounter Provider  Constance Haw, MD Constance Haw, MD  Outpatient Medication Detail    Disp Refills Start End   verapamil (CALAN-SR) 240 MG CR tablet 90 tablet 2 03/30/2018    Sig - Route: Take 1 tablet (240 mg total) by mouth at bedtime. - Oral   Sent to pharmacy as: verapamil (CALAN-SR) 240 MG CR tablet   Notes to Pharmacy: Dose increase   E-Prescribing Status: Receipt confirmed by pharmacy (03/30/2018 1:40 PM EDT)   Pharmacy   [facility] [place], [place]

## 2018-04-13 ENCOUNTER — Ambulatory Visit: Payer: Medicare HMO | Admitting: Adult Health

## 2018-04-18 ENCOUNTER — Ambulatory Visit: Payer: Medicare HMO | Admitting: Adult Health

## 2018-04-18 ENCOUNTER — Encounter: Payer: Self-pay | Admitting: Adult Health

## 2018-04-18 DIAGNOSIS — R001 Bradycardia, unspecified: Secondary | ICD-10-CM

## 2018-04-18 DIAGNOSIS — T50905A Adverse effect of unspecified drugs, medicaments and biological substances, initial encounter: Secondary | ICD-10-CM | POA: Diagnosis not present

## 2018-04-18 DIAGNOSIS — J984 Other disorders of lung: Secondary | ICD-10-CM

## 2018-04-18 NOTE — Progress Notes (Signed)
@Patient  ID: Duane Reyes, male    DOB: 09/25/1950, 67 y.o.   MRN: [id number]  Chief Complaint  Patient presents with  . Follow-up    Referring provider: [name], MD  HPI: 67 year old male never smoker seen for pulmonary consult on December 01, 2017 for preop clearance .  PMH -Chronic Bronchitis , Scoliosis   TEST  May 2018 CT chest images independently reviewed showing fairly severe scoliosis changes but nosignificantpulmonary parenchymal abnormality(some mild reticular changes in bases, non-specific and rare)  PFT: April 2018 ratio normal, FVC 2.73 L 70% predicted, total lung capacity 3.43 L 55% predicted, ERV 40% predicted, DLCO 20.1-74% predicted 2D echo February 2019 EF 50 to 36%, diastolic dysfunction  6/44/0347 Follow up : Dyspnea  Patient returns for a 78-month-old.  Patient was seen earlier this year for preop clearance.  He had knee surgery in April.  He says he has been doing better with his knee.  He is finished his physical therapy.  And is getting ready to start exercising. He was previously seen due to recurrent bronchitis and dyspnea.  His bronchitis has resolved.  He does have chronic shortness of breath with activity.  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  Patient has very severe lifelong scoliosis.  Weight has increased gradually over the years.  And has gained quite a bit of weight in his stomach area.  He has been more and active over the last several years.  Due to joint problems.  PFT showed a restrictive pattern.  CT chest showed no evidence of ILD.  Has gained weight in his stomach.  Has been very inactive. PFT showed more of a restrictive pattern.   Followed by cardiology.  Has known coronary artery disease.  Previous PVCs.  Heart rate ranges anywhere between the 40s and 50s. No chest pain or syncope.    Allergies  Allergen Reactions  . Topamax [Topiramate] Other (See  Comments)    blurred vision, tired, ringing in ears, restless, confussion blurred vision, tired, ringing in ears, restless, confussion  . Celebrex [Celecoxib]     unknown  . Crestor [Rosuvastatin Calcium]     myalgias  . Pitavastatin Rash    Per patient had a rash and a cough    Immunization History  Administered Date(s) Administered  . DT 08/29/2009  . Influenza, High Dose Seasonal PF 06/20/2016, 07/14/2016, 06/06/2017  . Pneumococcal Conjugate-13 10/20/2015  . Pneumococcal Polysaccharide-23 08/29/2006, 03/16/2017  . Td 08/29/2009  . Tdap 06/04/2015    Past Medical History:  Diagnosis Date  . Arthritis    "left knee" (01/29/2016)  . 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 Bronchiolitis   . CAD in native artery    a. Abnl cardiac CT -> LHC 01/2016 s/p 50% mLAD, 25% OM2, 95% RPDA which was treated with overlapping DES.  [name] Chronic lower back pain   . Dyspnea   . Frequent PVCs    a. 12/2015 - Holter showed SB, NSR, ST, HR 54-108, with frequent PVCs in singles, couplets, and bigemy, with elevated PVC load 24%.  [name] GERD (gastroesophageal reflux disease)   . History of shingles   . Hyperlipidemia   . Hypertension   . Hypothyroidism   . Prostate cancer ([place])   . Tortuosity of aortic arch, -would not pursue Rt radial approach in this patient for future cardiac caths     Tobacco History: Social History   Tobacco Use  Smoking Status Never Smoker  Smokeless Tobacco Never Used   Counseling given: Not Answered   Outpatient Medications Prior to Visit  Medication Sig Dispense Refill  . acetaminophen (TYLENOL) 500 MG tablet Take 2 tablets (1,000 mg total) by mouth every 6 (six) hours. 30 tablet 0  . aspirin 81 MG chewable tablet Chew 1 tablet (81 mg total) by mouth 2 (two) times daily. 30 tablet 0  . atorvastatin (LIPITOR) 40 MG tablet Take 1 tablet (40 mg total) by mouth daily at 6 PM. 30 tablet 6  . clopidogrel (PLAVIX) 75 MG tablet TAKE 1 TABLET (75 MG TOTAL) BY MOUTH DAILY WITH  BREAKFAST. 90 tablet 0  . Coenzyme Q10 (CO Q 10) 100 MG CAPS Take 1 capsule by mouth daily.    [name] levothyroxine (SYNTHROID, LEVOTHROID) 112 MCG tablet Take 112 mcg by mouth daily before breakfast.    . mexiletine (MEXITIL) 150 MG capsule Take 1 capsule (150 mg total) by mouth 2 (two) times daily. 180 capsule 3  . Multiple Vitamins-Minerals (PRESERVISION AREDS 2 PO) Take 1 capsule by mouth 2 (two) times daily.    . nitroGLYCERIN (NITROSTAT) 0.4 MG SL tablet Place 0.4 mg under the tongue every 5 (five) minutes as needed for chest pain (x 3 doses).    . pantoprazole (PROTONIX) 40 MG tablet TAKE 1 TABLET EVERY DAY 90 tablet 0  . ranitidine (ZANTAC) 150 MG capsule Take 1 capsule by mouth 2 (two) times daily.  3  . verapamil (CALAN-SR) 240 MG CR tablet Take 1 tablet (240 mg total) by mouth at bedtime. 90 tablet 2  . methocarbamol (ROBAXIN) 500 MG tablet Take 1-2 tablets (500-1,000 mg total) by mouth every 6 (six) hours as needed for muscle spasms. (Patient not taking: Reported on 04/18/2018) 60 tablet 0  . oxyCODONE 10 MG TABS Take 1 tablet (10 mg total) by mouth every 4 (four) hours as needed for moderate pain (pain score 4-6). (Patient not taking: Reported on 04/18/2018) 40 tablet 0   No facility-administered medications prior to visit.      Review of Systems  Constitutional:   No  weight loss, night sweats,  Fevers, chills,  +fatigue, or  lassitude.  HEENT:   No headaches,  Difficulty swallowing,  Tooth/dental problems, or  Sore throat,                No sneezing, itching, ear ache, nasal congestion, post nasal drip,   CV:  No chest pain,  Orthopnea, PND, swelling in lower extremities, anasarca, dizziness, palpitations, syncope.   GI  No heartburn, indigestion, abdominal pain, nausea, vomiting, diarrhea, change in bowel habits, loss of appetite, bloody stools.   Resp:  No excess mucus, no productive cough,  No non-productive cough,  No coughing up of blood.  No change in color of mucus.  No  wheezing.  No chest wall deformity  Skin: no rash or lesions.  GU: no dysuria, change in color of urine, no urgency or frequency.  No flank pain, no hematuria   MS:  No joint pain or swelling.  No decreased range of motion.  No back pain.    Physical Exam  BP 126/68 (BP Location: Left Arm, Cuff Size: Normal)   Pulse (!) 44   Ht 5\' 7"  (1.702 m)   Wt 176 lb 9.6 oz (80.1 kg)   SpO2 97%   BMI 27.66 kg/m   GEN: A/Ox3; pleasant , NAD, well nourished    HEENT:  [place]/AT,  EACs-clear, TMs-wnl, NOSE-clear,  THROAT-clear, no lesions, no postnasal drip or exudate noted.   NECK:  Supple w/ fair ROM; no JVD; normal carotid impulses w/o bruits; no thyromegaly or nodules palpated; no lymphadenopathy.    RESP  Clear  P & A; w/o, wheezes/ rales/ or rhonchi. no accessory muscle use, no dullness to percussion  CARD:  RRR, no m/r/g, no peripheral edema, pulses intact, no cyanosis or clubbing.  GI:   Soft & nt; nml bowel sounds; no organomegaly or masses detected.   Musco: Warm bil, no deformities or joint swelling noted.  Severe scoliosis   Neuro: alert, no focal deficits noted.    Skin: Warm, no lesions or rashes    Lab Results:  CBC    Component Value Date/Time   WBC 6.9 12/07/2017 0921   RBC 5.08 12/07/2017 0921   HGB 16.3 12/07/2017 0921   HCT 49.1 12/07/2017 0921   PLT 161 12/07/2017 0921   MCV 96.7 12/07/2017 0921   MCH 32.1 12/07/2017 0921   MCHC 33.2 12/07/2017 0921   RDW 13.8 12/07/2017 0921   LYMPHSABS 1.6 12/07/2017 0921   MONOABS 0.6 12/07/2017 0921   EOSABS 0.2 12/07/2017 0921   BASOSABS 0.0 12/07/2017 0921    BMET    Component Value Date/Time   NA 139 12/07/2017 0921   K 4.4 12/07/2017 0921   CL 108 12/07/2017 0921   CO2 22 12/07/2017 0921   GLUCOSE 96 12/07/2017 0921   BUN 16 12/07/2017 0921   CREATININE 1.12 12/07/2017 0921   CREATININE 1.29 (H) 01/27/2016 1652   CALCIUM 8.9 12/07/2017 0921   GFRNONAA >60 12/07/2017 0921   GFRAA >60 12/07/2017 0921     BNP    Component Value Date/Time   BNP 30.0 04/29/2015 1225    ProBNP No results found for: PROBNP  Imaging: No results found.   Assessment & Plan:   No problem-specific Assessment & Plan notes found for this encounter.     Rexene Edison, NP 04/18/2018

## 2018-04-18 NOTE — Patient Instructions (Addendum)
Advance Activity as tolerated  Call Cardiologist for follow up and discuss low heart rate .  Follow up with our office as needed.

## 2018-04-19 DIAGNOSIS — J984 Other disorders of lung: Secondary | ICD-10-CM | POA: Insufficient documentation

## 2018-04-19 NOTE — Assessment & Plan Note (Signed)
Bradycardia on exam appears to be asymptomatic.  Patient is advised to follow-up with cardiology

## 2018-04-19 NOTE — Assessment & Plan Note (Signed)
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  Work-up for dyspnea unrevealing with no ILD on CT chest.  PFTs with mild to moderate restriction.  No desaturations with ambulation.  On room air.  Suspect chronic dyspnea is multifactorial with underlying restrictive lung disease, deconditioning, diastolic heart failure.  Patient does not drop his oxygen with ambulation.  Do not feel that inhalers will be of benefit.  Would advance activity as tolerated.  May follow-up with the pulmonary clinic as needed.

## 2018-05-14 ENCOUNTER — Other Ambulatory Visit: Payer: Self-pay | Admitting: Cardiology

## 2018-05-14 MED ORDER — NITROGLYCERIN 0.4 MG SL SUBL: 0.4000 mg | SUBLINGUAL_TABLET | SUBLINGUAL | 5 refills | Status: DC | PRN

## 2018-06-05 ENCOUNTER — Other Ambulatory Visit: Payer: Self-pay | Admitting: Cardiology

## 2018-07-17 ENCOUNTER — Emergency Department (HOSPITAL_COMMUNITY): Payer: Medicare HMO

## 2018-07-17 ENCOUNTER — Emergency Department (HOSPITAL_COMMUNITY)
Admission: EM | Admit: 2018-07-17 | Discharge: 2018-07-17 | Disposition: A | Discharge status: Home / Self Care | Payer: Medicare HMO | Attending: Emergency Medicine | Admitting: Emergency Medicine

## 2018-07-17 ENCOUNTER — Encounter (HOSPITAL_COMMUNITY): Payer: Self-pay | Admitting: Emergency Medicine

## 2018-07-17 DIAGNOSIS — M419 Scoliosis, unspecified: Secondary | ICD-10-CM | POA: Diagnosis not present

## 2018-07-17 DIAGNOSIS — E039 Hypothyroidism, unspecified: Secondary | ICD-10-CM | POA: Diagnosis not present

## 2018-07-17 DIAGNOSIS — M40209 Unspecified kyphosis, site unspecified: Secondary | ICD-10-CM | POA: Insufficient documentation

## 2018-07-17 DIAGNOSIS — Z79899 Other long term (current) drug therapy: Secondary | ICD-10-CM | POA: Insufficient documentation

## 2018-07-17 DIAGNOSIS — R079 Chest pain, unspecified: Secondary | ICD-10-CM | POA: Diagnosis not present

## 2018-07-17 DIAGNOSIS — Z7902 Long term (current) use of antithrombotics/antiplatelets: Secondary | ICD-10-CM | POA: Insufficient documentation

## 2018-07-17 DIAGNOSIS — R0602 Shortness of breath: Secondary | ICD-10-CM | POA: Insufficient documentation

## 2018-07-17 DIAGNOSIS — I10 Essential (primary) hypertension: Secondary | ICD-10-CM | POA: Insufficient documentation

## 2018-07-17 DIAGNOSIS — Z8546 Personal history of malignant neoplasm of prostate: Secondary | ICD-10-CM | POA: Diagnosis not present

## 2018-07-17 DIAGNOSIS — I2510 Atherosclerotic heart disease of native coronary artery without angina pectoris: Secondary | ICD-10-CM | POA: Insufficient documentation

## 2018-07-17 DIAGNOSIS — Z96652 Presence of left artificial knee joint: Secondary | ICD-10-CM | POA: Diagnosis not present

## 2018-07-17 LAB — BASIC METABOLIC PANEL
ANION GAP: 7 (ref 5–15)
BUN: 15 mg/dL (ref 8–23)
CALCIUM: 9.1 mg/dL (ref 8.9–10.3)
CO2: 24 mmol/L (ref 22–32)
Chloride: 109 mmol/L (ref 98–111)
Creatinine, Ser: 1.06 mg/dL (ref 0.61–1.24)
GFR calc Af Amer: >60 mL/min (ref >60)
GFR calc non Af Amer: >60 mL/min (ref >60)
GLUCOSE: 91 mg/dL (ref 70–99)
Potassium: 4.2 mmol/L (ref 3.5–5.1)
Sodium: 140 mmol/L (ref 135–145)

## 2018-07-17 LAB — CBC
HEMATOCRIT: 48.6 % (ref 39.0–52.0)
HEMOGLOBIN: 15.8 g/dL (ref 13.0–17.0)
MCH: 30.8 pg (ref 26.0–34.0)
MCHC: 32.5 g/dL (ref 30.0–36.0)
MCV: 94.7 fL (ref 80.0–100.0)
PLATELETS: 175 10*3/uL (ref 150–400)
RBC: 5.13 MIL/uL (ref 4.22–5.81)
RDW: 13.2 % (ref 11.5–15.5)
WBC: 7.9 10*3/uL (ref 4.0–10.5)
nRBC: 0 % (ref 0.0–0.2)

## 2018-07-17 LAB — TROPONIN I
Troponin I: <0.03 ng/mL (ref <0.03)
Troponin I: <0.03 ng/mL (ref <0.03)

## 2018-07-17 MED ORDER — NITROGLYCERIN 0.4 MG SL SUBL
0.4000 mg | SUBLINGUAL_TABLET | SUBLINGUAL | Status: DC | PRN
  Administered 2018-07-17: 0.4 mg via SUBLINGUAL
  Filled 2018-07-17: qty 1

## 2018-07-17 MED ORDER — ASPIRIN 81 MG PO CHEW
324.0000 mg | CHEWABLE_TABLET | Freq: Once | ORAL | Status: AC
  Administered 2018-07-17: 324 mg via ORAL
  Filled 2018-07-17: qty 4

## 2018-07-17 NOTE — ED Provider Notes (Signed)
[facility] EMERGENCY DEPARTMENT Provider Note   CSN: [id number] Arrival date & time: 07/17/18  1730     History   Chief Complaint Chief Complaint  Patient presents with  . Chest Pain    HPI Duane Reyes is a 67 y.o. male.  HPI Pt started having sharp chest pain on the left side of his chest since yesterday.  It has been pretty constant since yesterday at noon.  He has some shortness of breath with it but this is more chronic and not new with these symptoms.  This is mostly with activity.  No fevers or cough.  No nause or radiation of the pain. Pt has history of CAD.  Previous stents.   Tried some ntg without any improvement  Past Medical History:  Diagnosis Date  . Arthritis    "left knee" (01/29/2016)  . 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 Bronchiolitis   . CAD in native artery    a. Abnl cardiac CT -> LHC 01/2016 s/p 50% mLAD, 25% OM2, 95% RPDA which was treated with overlapping DES.  [name] Chronic lower back pain   . Dyspnea   . Frequent PVCs    a. 12/2015 - Holter showed SB, NSR, ST, HR 54-108, with frequent PVCs in singles, couplets, and bigemy, with elevated PVC load 24%.  [name] GERD (gastroesophageal reflux disease)   . History of shingles   . Hyperlipidemia   . Hypertension   . Hypothyroidism   . Prostate cancer ([place])   . Tortuosity of aortic arch, -would not pursue Rt radial approach in this patient for future cardiac caths     Patient Active Problem List   Diagnosis Date Noted  . Restrictive lung disease 04/19/2018  . 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 Bronchitis 12/13/2017  . Pre-operative clearance 12/13/2017  . 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 CAD in native artery 01/30/2016  . S/P angioplasty with stent 01/29/16 RPDA with 2 overlapping DES   01/30/2016  . Tortuosity of aortic arch, -would not pursue Rt radial approach in this patient for future cardiac caths 01/30/2016  . DOE (dyspnea on exertion)   . Coronary artery  calcification seen on CT scan   . PVC (premature ventricular contraction) 01/12/2016  . HTN (hypertension) 04/30/2015  . Hyperlipidemia LDL goal <100 04/30/2015  . Spondylolisthesis of lumbar region L4-5 L5-S1 03/12/2013  . 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 BACK SURGERY    . CARDIAC CATHETERIZATION N/A 01/29/2016   Procedure: Right/Left Heart Cath and Coronary Angiography;  Surgeon: Jettie Booze, MD;  Location: [facility];  Service: Cardiovascular;  Laterality: N/A;  . CARDIAC CATHETERIZATION N/A 01/29/2016   Procedure: Coronary Stent Intervention 2.5/16mm Synergy-proximal PDA;  Surgeon: Jettie Booze, MD;  Location: [facility];  Service: Cardiovascular;  Laterality: N/A;  . CATARACT EXTRACTION W/ INTRAOCULAR LENS IMPLANT Left 11/2015  . COLONOSCOPY    . CORONARY ANGIOPLASTY WITH STENT PLACEMENT  01/29/2016   "2 stents"  . CYST EXCISION Right 2005   "near bicep"  . ESOPHAGOGASTRODUODENOSCOPY    . EYE SURGERY    . JOINT REPLACEMENT    . KNEE ARTHROSCOPY Left 2012   "torn meniscus"  . POSTERIOR LUMBAR FUSION  02/2013   "L4-5; hardware in place"  . PROSTATE BIOPSY  2013  . PROSTATE SURGERY  2013   "brachytherapy for prostate radiation"  . TONSILLECTOMY  1950s  .  TOTAL KNEE ARTHROPLASTY Left 12/18/2017  . TOTAL KNEE ARTHROPLASTY Left 12/18/2017   Procedure: TOTAL KNEE ARTHROPLASTY;  Surgeon: Vickey Huger, MD;  Location: [facility];  Service: Orthopedics;  Laterality: Left;        Home Medications    Prior to Admission medications   Medication Sig Start Date End Date Taking? Authorizing Provider  Albuterol Sulfate (PROAIR HFA IN) Inhale 2 puffs into the lungs every 6 (six) hours as needed (for wheezing or shortness of breath).    Yes [provider]  atorvastatin (LIPITOR) 40 MG tablet Take 1 tablet (40 mg total) by mouth daily at 6 PM. 01/30/16  Yes Isaiah Serge, NP  budesonide-formoterol (SYMBICORT) 80-4.5 MCG/ACT inhaler Inhale 2 puffs into the lungs 2 (two) times daily as needed (for flares).  12/18/17  Yes [provider]  Cholecalciferol (VITAMIN D-3 PO) Take 1 capsule by mouth 2 (two) times a week.   Yes [provider]  clopidogrel (PLAVIX) 75 MG tablet TAKE 1 TABLET EVERY DAY WITH BREAKFAST 06/05/18  Yes Isaiah Serge, NP  Coenzyme Q10 (CO Q-10) 120 MG CAPS Take 120 mg by mouth 3 (three) times daily.   Yes [provider]  levothyroxine (SYNTHROID, LEVOTHROID) 125 MCG tablet Take 125 mcg by mouth daily before breakfast.   Yes [provider]  mexiletine (MEXITIL) 150 MG capsule Take 1 capsule (150 mg total) by mouth 2 (two) times daily. 11/21/17  Yes Camnitz, Will Hassell Done, MD  Multiple Vitamins-Minerals (PRESERVISION AREDS 2 PO) Take 1 capsule by mouth 2 (two) times daily.   Yes [provider]  nitroGLYCERIN (NITROSTAT) 0.4 MG SL tablet Place 1 tablet (0.4 mg total) under the tongue every 5 (five) minutes as needed for chest pain (x 3 doses). 05/14/18  Yes Camnitz, Will Hassell Done, MD  pantoprazole (PROTONIX) 40 MG tablet TAKE 1 TABLET EVERY DAY 06/05/18  Yes Isaiah Serge, NP  verapamil (CALAN-SR) 240 MG CR tablet Take 1 tablet (240 mg total) by mouth at bedtime. 03/30/18  Yes Camnitz, Ocie Doyne, MD  acetaminophen (TYLENOL) 500 MG tablet Take 500 mg by mouth every 6 (six) hours as needed.    [provider]    Family History Family History  Problem Relation Age of Onset  . Heart attack Father   . Heart disease Father     Social History Social History   Tobacco Use  . Smoking status: Never Smoker  . Smokeless tobacco: Never Used  Substance Use Topics  . Alcohol use: Yes    Comment: 01/29/2016 "glass of wine q couple months"  . Drug use: No     Allergies   Topiramate; Celecoxib; Pitavastatin; Rosuvastatin calcium; and Hydrocodone-acetaminophen   Review of Systems Review of  Systems  All other systems reviewed and are negative.    Physical Exam Updated Vital Signs BP (!) 137/91 (BP Location: Left Arm)   Pulse 74   Temp 98 F (36.7 C) (Oral)   Resp 15   SpO2 99%   Physical Exam  Constitutional: He appears well-developed and well-nourished. No distress.  HENT:  Head: Normocephalic and atraumatic.  Right Ear: External ear normal.  Left Ear: External ear normal.  Eyes: Conjunctivae are normal. Right eye exhibits no discharge. Left eye exhibits no discharge. No scleral icterus.  Neck: Neck supple. No tracheal deviation present.  Cardiovascular: Normal rate, regular rhythm and intact distal pulses.  Pulmonary/Chest: Effort normal and breath sounds normal. No stridor. No respiratory distress. He has no wheezes.  He has no rales.  Abdominal: Soft. Bowel sounds are normal. He exhibits no distension. There is no tenderness. There is no rebound and no guarding.  Musculoskeletal: He exhibits no edema or tenderness.  Scoliosis and kyphosis  Neurological: He is alert. He has normal strength. No cranial nerve deficit (no facial droop, extraocular movements intact, no slurred speech) or sensory deficit. He exhibits normal muscle tone. He displays no seizure activity. Coordination normal.  Skin: Skin is warm and dry. No rash noted.  Psychiatric: He has a normal mood and affect.  Nursing note and vitals reviewed.    ED Treatments / Results  Labs (all labs ordered are listed, but only abnormal results are displayed) Labs Reviewed  BASIC METABOLIC PANEL  CBC  TROPONIN I  TROPONIN I    EKG EKG Interpretation  Date/Time:  Tuesday July 17 2018 17:35:52 EST Ventricular Rate:  78 PR Interval:  196 QRS Duration: 94 QT Interval:  374 QTC Calculation: 426 R Axis:   50 Text Interpretation:  Sinus rhythm with frequent Premature ventricular complexes Incomplete right bundle branch block Anterior infarct , age undetermined Abnormal ECG pvcs are new Confirmed by  Dorie Rank [phone]) on 07/17/2018 5:51:30 PM   Radiology No results found.  Procedures Procedures (including critical care time)  Medications Ordered in ED Medications  aspirin chewable tablet 324 mg (324 mg Oral Given 07/17/18 1758)     Initial Impression / Assessment and Plan / ED Course  I have reviewed the triage vital signs and the nursing notes.  Pertinent labs & imaging results that were available during my care of the patient were reviewed by me and considered in my medical decision making (see chart for details).  Clinical Course as of Jul 20 1501  Tue Jul 17, 2018  2032 Pt is feeling better.  Sx free now   [JK]  2051 Discussed with Dr Gwenlyn Found \   [JK]    Clinical Course User Index [JK] Dorie Rank, MD    Pt presents to the ED with chest pain.  Known history of acs.  Sx are not typical for ACS in that it has been constant for since yesterday.  Pt states the pain has never resolved.  Cardic enzymes are normal.  No acute ischemia on EKG.  Pt however is at higher risk and has a moderate risk heart score.  Pt would prefer to go home if possible.  I will plan on a delta troponin.  Consult with cardiology to discuss observation admission vs delta trop and close follow up.  Discussed with Dr Gwenlyn Found.  Will have pt closely follow up as an outpatient.  Delta trop was negative.   Final Clinical Impressions(s) / ED Diagnoses   Final diagnoses:  Chest pain, unspecified type    ED Discharge Orders    None       Dorie Rank, MD 07/20/18 1502

## 2018-07-17 NOTE — Discharge Instructions (Addendum)
Continue your current medications, follow up with Dr [name].  Return as needed for worsening symptoms

## 2018-07-17 NOTE — ED Triage Notes (Signed)
Pt reports chest pain/ discomfort mid to left sided with no radiation that started yesterday. Denies any dizziness. Endorses some SOB but states that is normal. Reports taking nitro this AM around 8-9 with very little relief.

## 2018-07-17 NOTE — ED Notes (Signed)
Patient verbalizes understanding of discharge instructions. Opportunity for questioning and answers were provided. Armband removed by staff, pt discharged from ED, ambulatory to lobby with family.

## 2018-07-19 ENCOUNTER — Encounter: Payer: Self-pay | Admitting: Cardiology

## 2018-07-19 ENCOUNTER — Ambulatory Visit: Payer: Medicare HMO | Admitting: Cardiology

## 2018-07-19 VITALS — BP 120/66 | HR 64 | Ht 67.0 in | Wt 177.0 lb

## 2018-07-19 DIAGNOSIS — I2510 Atherosclerotic heart disease of native coronary artery without angina pectoris: Secondary | ICD-10-CM | POA: Diagnosis not present

## 2018-07-19 NOTE — Progress Notes (Signed)
07/19/2018 Duane Reyes   Jan 14, 1951  [id number]  Primary Physician [name], MD Primary Cardiologist: [name] EP: [name]   Reason for Visit/CC: Post ED f/u for CP   HPI:  Duane Reyes is a 67 y.o. male, followed by [name], who presents to clinic today for post ED f/u after visit there for CP.  He has a h/o CAD. He initially underwent CP w/u with a NST that showed no ischemia and normal LVEF, however given continued CP and dyspnea, [name] elected to order a coronary CTA that showed significant calcification of the coronary arteries. This lead to a [place] 01/2016 and he was found to have 50% mid LAD, 25% OM2, 95% RPDA s/p PCI. Despite PCI, he continued to have significant dyspnea. [name] subsequently ordered PFTs which were markedly abnormal. He was referred to pulmonology. He was seen by Dr. Malvin Johns. He reports he was felt to have restricted breathing secondary to scoliosis. No COPD. Also of note, he also has a h/o PVCs. This is followed by [name].  He has been unable to tolerate beta-blockers. He is on mexiletine verapamil. In Feb of this year, [name] ordered a stress test.  This was negative for ischemia but EF was noted to be reduced at 27%.  Subsequently he recommended an echocardiogram to better assess EF.  Echo was normal.  EF by echo was 50 to 55%.  He also has hypothyroidism, followed by his PCP Dr. Edrick Oh. He is on thyroid replacement therapy with Synthroid, 112 mcg daily.   He was recently seen in the ED for CP. Symptoms were noted to be atypical based on EDP note.  He had left-sided chest pain.  Sharp in nature.  Also pleuritic.  Lasted several hours.  No improvement with nitroglycerin.  Work-up in the ED was negative.  Troponins were negative x 2.  EKG was nonischemic.  His symptoms were not felt to be cardiac in nature.  Pt preferred to be relased and have outpatient f/u arranged. He is here today for f/u.  He is here with his wife.  He reports that  he is done better.  Denies any significant recurrence.  He denies any exertional chest pain with activity.  He does continue to have some exertional dyspnea which has not changed since he was diagnosed with restrictive breathing secondary to scoliosis.  He denies any resting dyspnea.  He reports full medication.  Blood pressure heart rates well controlled.   Current Meds  Medication Sig  . acetaminophen (TYLENOL) 500 MG tablet Take 500 mg by mouth every 6 (six) hours as needed.  . Albuterol Sulfate (PROAIR HFA IN) Inhale 2 puffs into the lungs every 6 (six) hours as needed (for wheezing or shortness of breath).   [name] atorvastatin (LIPITOR) 40 MG tablet Take 1 tablet (40 mg total) by mouth daily at 6 PM.  . budesonide-formoterol (SYMBICORT) 80-4.5 MCG/ACT inhaler Inhale 2 puffs into the lungs 2 (two) times daily as needed (for flares).   . Cholecalciferol (VITAMIN D-3 PO) Take 1 capsule by mouth 2 (two) times a week.  . clopidogrel (PLAVIX) 75 MG tablet TAKE 1 TABLET EVERY DAY WITH BREAKFAST  . Coenzyme Q10 (CO Q-10) 120 MG CAPS Take 120 mg by mouth 3 (three) times daily.  [name] levothyroxine (SYNTHROID, LEVOTHROID) 125 MCG tablet Take 125 mcg by mouth daily before breakfast.  . mexiletine (MEXITIL) 150 MG capsule Take 1 capsule (150 mg total) by mouth 2 (two) times  daily.  . Multiple Vitamins-Minerals (PRESERVISION AREDS 2 PO) Take 1 capsule by mouth 2 (two) times daily.  . nitroGLYCERIN (NITROSTAT) 0.4 MG SL tablet Place 1 tablet (0.4 mg total) under the tongue every 5 (five) minutes as needed for chest pain (x 3 doses).  . pantoprazole (PROTONIX) 40 MG tablet TAKE 1 TABLET EVERY DAY  . verapamil (CALAN-SR) 240 MG CR tablet Take 1 tablet (240 mg total) by mouth at bedtime.   Allergies  Allergen Reactions  . Topiramate Anxiety, Palpitations and Other (See Comments)    blurred vision, tired, ringing in ears, restlessness, confusion, rapid heart rate, made him jittery  . Celecoxib Other (See Comments)     GI Upset; did not mix with his Nexium/Caused acid relux    . Pitavastatin Rash and Cough    LIVALO   . Rosuvastatin Calcium Other (See Comments)    Muscle aches   . Hydrocodone-Acetaminophen Other (See Comments)    Delusions    Past Medical History:  Diagnosis Date  . Arthritis    "left knee" (01/29/2016)  . 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 Bronchiolitis   . CAD in native artery    a. Abnl cardiac CT -> LHC 01/2016 s/p 50% mLAD, 25% OM2, 95% RPDA which was treated with overlapping DES.  [name] Chronic lower back pain   . Dyspnea   . Frequent PVCs    a. 12/2015 - Holter showed SB, NSR, ST, HR 54-108, with frequent PVCs in singles, couplets, and bigemy, with elevated PVC load 24%.  [name] GERD (gastroesophageal reflux disease)   . History of shingles   . Hyperlipidemia   . Hypertension   . Hypothyroidism   . Prostate cancer ([place])   . Tortuosity of aortic arch, -would not pursue Rt radial approach in this patient for future cardiac caths    Family History  Problem Relation Age of Onset  . Heart attack Father   . Heart disease Father    Past Surgical History:  Procedure Laterality Date  . BACK SURGERY    . CARDIAC CATHETERIZATION N/A 01/29/2016   Procedure: Right/Left Heart Cath and Coronary Angiography;  Surgeon: Jettie Booze, MD;  Location: [facility];  Service: Cardiovascular;  Laterality: N/A;  . CARDIAC CATHETERIZATION N/A 01/29/2016   Procedure: Coronary Stent Intervention 2.5/16mm Synergy-proximal PDA;  Surgeon: Jettie Booze, MD;  Location: [facility];  Service: Cardiovascular;  Laterality: N/A;  . CATARACT EXTRACTION W/ INTRAOCULAR LENS IMPLANT Left 11/2015  . COLONOSCOPY    . CORONARY ANGIOPLASTY WITH STENT PLACEMENT  01/29/2016   "2 stents"  . CYST EXCISION Right 2005   "near bicep"  . ESOPHAGOGASTRODUODENOSCOPY    . EYE SURGERY    . JOINT REPLACEMENT    . KNEE ARTHROSCOPY Left 2012   "torn meniscus"  . POSTERIOR LUMBAR FUSION  02/2013    "L4-5; hardware in place"  . PROSTATE BIOPSY  2013  . PROSTATE SURGERY  2013   "brachytherapy for prostate radiation"  . TONSILLECTOMY  1950s  . TOTAL KNEE ARTHROPLASTY Left 12/18/2017  . TOTAL KNEE ARTHROPLASTY Left 12/18/2017   Procedure: TOTAL KNEE ARTHROPLASTY;  Surgeon: Vickey Huger, MD;  Location: [facility];  Service: Orthopedics;  Laterality: Left;   Social History   Socioeconomic History  . Marital status: Married    Spouse name: Not on file  . Number of children: Not on file  . Years of education: Not on file  . Highest education level: Not on file  Occupational History  . Not on file  Social Needs  . Financial resource strain: Not on file  . Food insecurity:    Worry: Not on file    Inability: Not on file  . Transportation needs:    Medical: Not on file    Non-medical: Not on file  Tobacco Use  . Smoking status: Never Smoker  . Smokeless tobacco: Never Used  Substance and Sexual Activity  . Alcohol use: Yes    Comment: 01/29/2016 "glass of wine q couple months"  . Drug use: No  . Sexual activity: Not Currently  Lifestyle  . Physical activity:    Days per week: Not on file    Minutes per session: Not on file  . Stress: Not on file  Relationships  . Social connections:    Talks on phone: Not on file    Gets together: Not on file    Attends religious service: Not on file    Active member of club or organization: Not on file    Attends meetings of clubs or organizations: Not on file    Relationship status: Not on file  . Intimate partner violence:    Fear of current or ex partner: Not on file    Emotionally abused: Not on file    Physically abused: Not on file    Forced sexual activity: Not on file  Other Topics Concern  . Not on file  Social History Narrative  . Not on file     Review of Systems: General: negative for chills, fever, night sweats or weight changes.  Cardiovascular: negative for chest pain, dyspnea on exertion, edema, orthopnea,  palpitations, paroxysmal nocturnal dyspnea or shortness of breath Dermatological: negative for rash Respiratory: negative for cough or wheezing Urologic: negative for hematuria Abdominal: negative for nausea, vomiting, diarrhea, bright red blood per rectum, melena, or hematemesis Neurologic: negative for visual changes, syncope, or dizziness All other systems reviewed and are otherwise negative except as noted above.   Physical Exam:  Blood pressure 120/66, pulse 64, height 5\' 7"  (1.702 m), weight 177 lb (80.3 kg), SpO2 96 %.  General appearance: alert, cooperative and no distress Neck: no adenopathy and no JVD Lungs: clear to auscultation bilaterally Heart: regular rate and rhythm, S1, S2 normal, no murmur, click, rub or gallop Extremities: extremities normal, atraumatic, no cyanosis or edema Pulses: 2+ and symmetric Skin: Skin color, texture, turgor normal. No rashes or lesions Neurologic: Grossly normal  EKG not performed -- personally reviewed   ASSESSMENT AND PLAN:   1. CAD: LHC 01/2016 showed  50% mid LAD, 25% OM2, 95% RPDA s/p PCI.  No recent symptoms consistent with angina.  Continue medical therapy.  2. Chest Pain: Recent chest pain was atypical. Evaluated in the ED.  Left-sided sharp stabbing pain lasting for several hours with negative troponins x2.  EKG in the ED was negative.  ED physician did not feel that this was acute coronary syndrome.  He presents back today for post ED follow-up.  Denies any recurrent chest pain.  He denies any exertional chest pressure or tightness.  He did have a stress test this past February which showed no ischemia.  EF by echo was normal.  We will not pursue any further cardiac evaluation at this time.  Patient was reassured and instructed to continue current medications for CAD.  He will notify us if he develops any chest pressure tightness/chest pain associated with exertion.   3. PVCs: Followed by [name].  He  is on verapamil and  mexiletine.  He denies any palpitations.  4. Chronic Exertional Dyspnea: History of abnormal PFTs leading to pulmonology referral.  After their assessment they felt that this was not COPD but instead restrictive breathing due to scoliosis.  He denies any change in baseline.   Follow-Up w/ [name] in 2020 as planned for his yearly.   Brittainy Ladoris Gene, MHS [facility] 07/19/2018 4:55 PM

## 2018-07-19 NOTE — Patient Instructions (Signed)
Medication Instructions:  none If you need a refill on your cardiac medications before your next appointment, please call your pharmacy.   Lab work: none If you have labs (blood work) drawn today and your tests are completely normal, you will receive your results only by: . MyChart Message (if you have MyChart) OR . A paper copy in the mail If you have any lab test that is abnormal or we need to change your treatment, we will call you to review the results.  Testing/Procedures: none  Follow-Up: At [facility], you and your health needs are our priority.  As part of our continuing mission to provide you with exceptional heart care, we have created designated Provider Care Teams.  These Care Teams include your primary Cardiologist (physician) and Advanced Practice Providers (APPs -  Physician Assistants and Nurse Practitioners) who all work together to provide you with the care you need, when you need it. .   Any Other Special Instructions Will Be Listed Below (If Applicable).    

## 2018-08-02 ENCOUNTER — Encounter (INDEPENDENT_AMBULATORY_CARE_PROVIDER_SITE_OTHER): Payer: Medicare HMO | Admitting: Ophthalmology

## 2018-08-02 DIAGNOSIS — H353132 Nonexudative age-related macular degeneration, bilateral, intermediate dry stage: Secondary | ICD-10-CM

## 2018-08-02 DIAGNOSIS — I10 Essential (primary) hypertension: Secondary | ICD-10-CM | POA: Diagnosis not present

## 2018-08-02 DIAGNOSIS — H43813 Vitreous degeneration, bilateral: Secondary | ICD-10-CM

## 2018-08-02 DIAGNOSIS — H59032 Cystoid macular edema following cataract surgery, left eye: Secondary | ICD-10-CM | POA: Diagnosis not present

## 2018-08-02 DIAGNOSIS — H35372 Puckering of macula, left eye: Secondary | ICD-10-CM | POA: Diagnosis not present

## 2018-08-02 DIAGNOSIS — H35033 Hypertensive retinopathy, bilateral: Secondary | ICD-10-CM

## 2018-08-02 DIAGNOSIS — H2511 Age-related nuclear cataract, right eye: Secondary | ICD-10-CM

## 2018-08-12 ENCOUNTER — Other Ambulatory Visit: Payer: Self-pay | Admitting: Cardiology

## 2018-08-13 ENCOUNTER — Other Ambulatory Visit: Payer: Self-pay | Admitting: Cardiology

## 2018-10-04 IMAGING — CT CT CHEST HIGH RESOLUTION W/O CM
2 of 5 series · 15 of 36 positions shown, 18 images · non-contrast
Comparison: 01/13/2017 and 01/21/2016.

CLINICAL DATA: Shortness of breath, bronchitis.  Cough.

EXAM:
CT CHEST WITHOUT CONTRAST
TECHNIQUE: Multidetector CT imaging of the chest was performed following the
standard protocol without intravenous contrast. High resolution
imaging of the lungs, as well as inspiratory and expiratory imaging,
was performed.

[Series 2: high resolution · axial · 0.78mm/px · z∈[+1264,+1530]mm · 12 of 147 slices shown, 15 images]
[im 7/147  mediastinal]
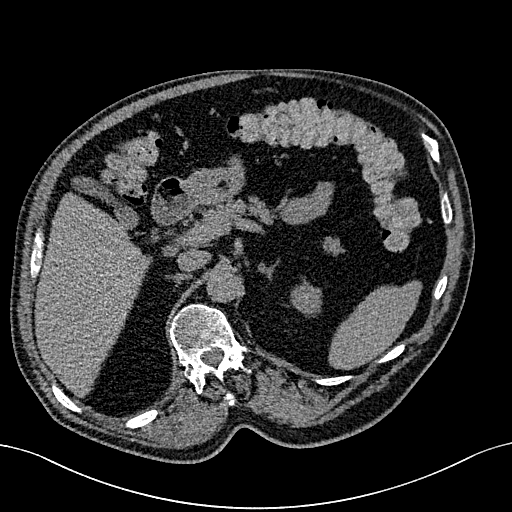
[im 7/147  lung]
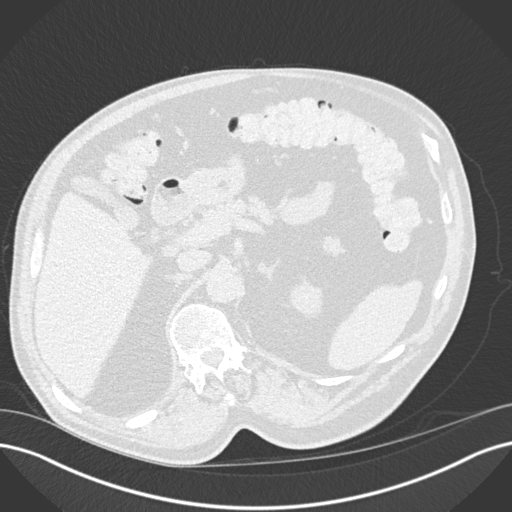
[im 20/147  lung]
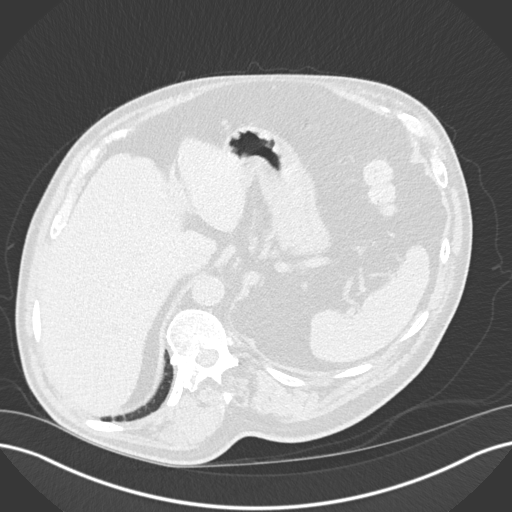
[im 32/147  lung]
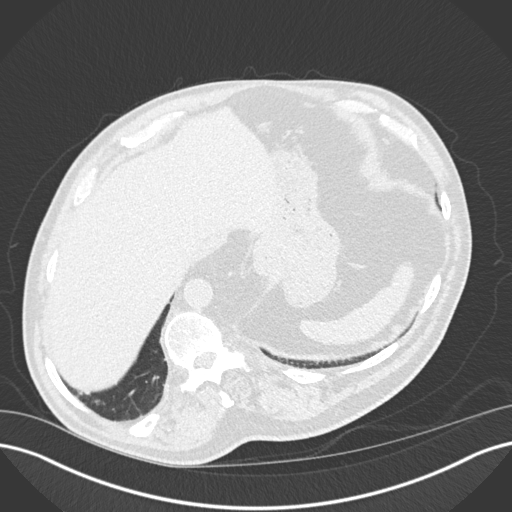
[im 45/147  lung]
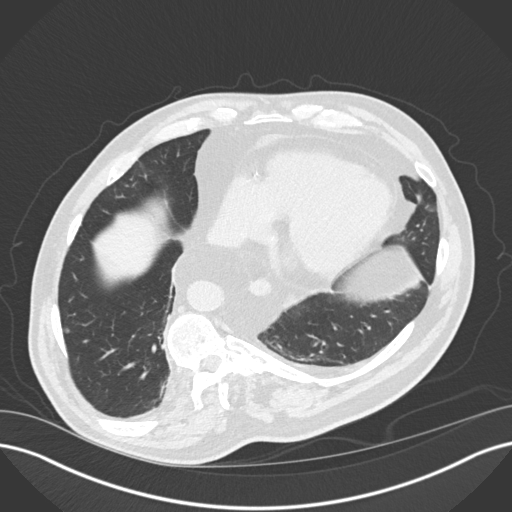
[im 58/147  mediastinal]
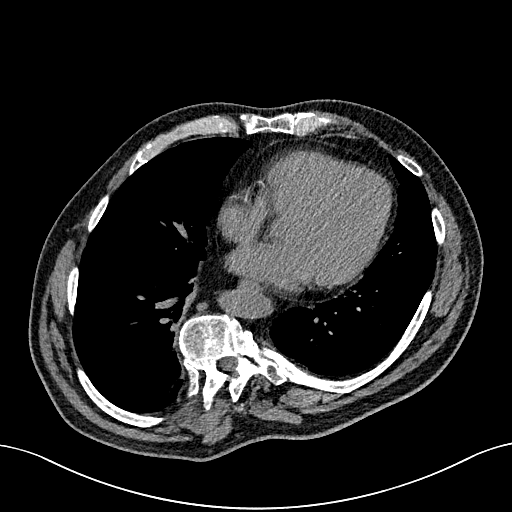
[im 58/147  lung]
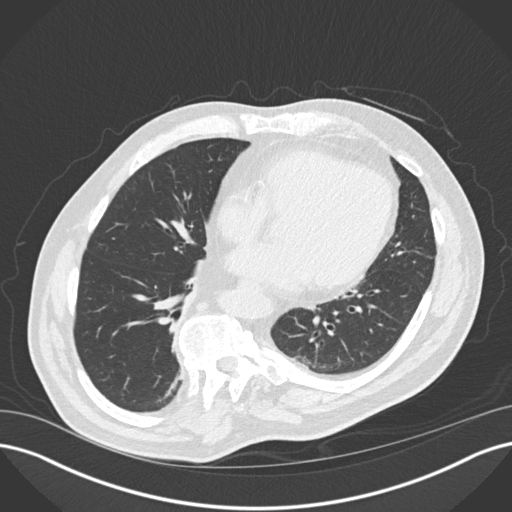
[im 70/147  lung]
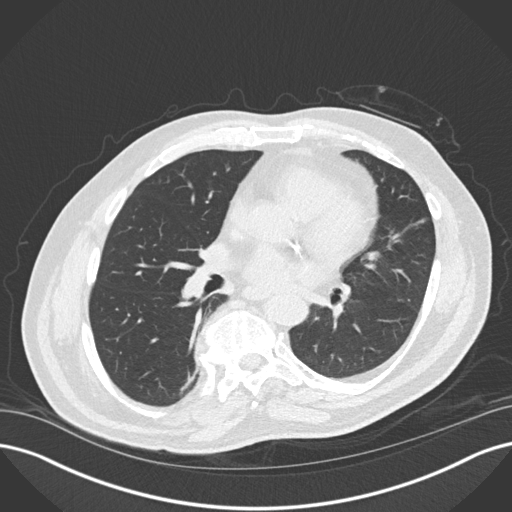
[im 77/147  lung]
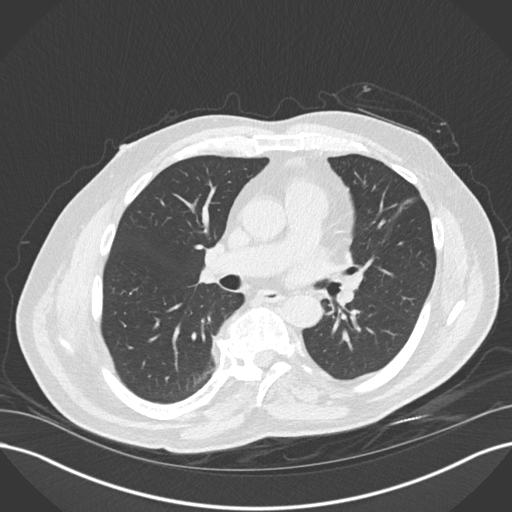
[im 89/147  lung]
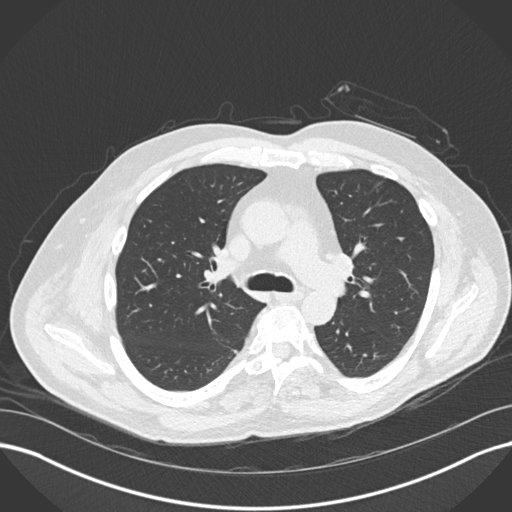
[im 102/147  mediastinal]
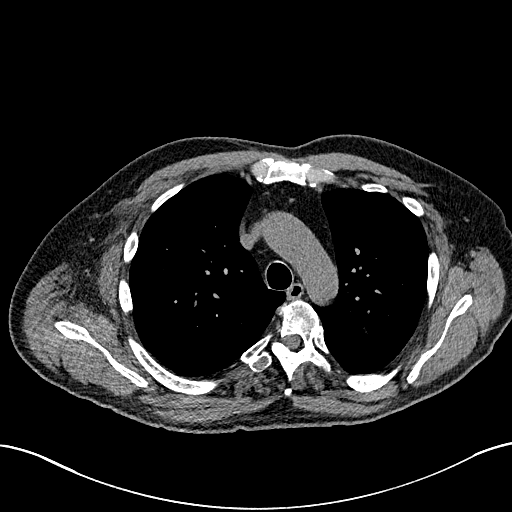
[im 102/147  lung]
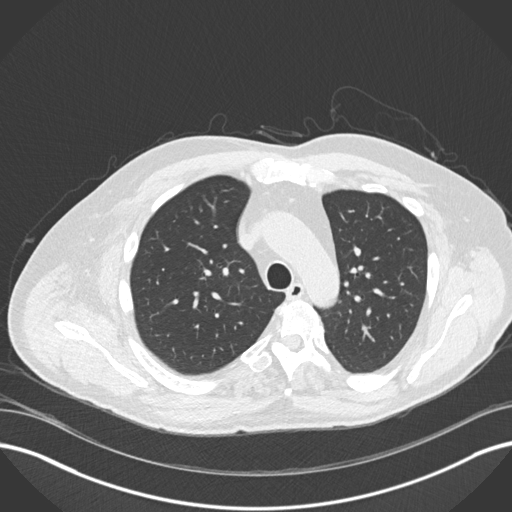
[im 115/147  lung]
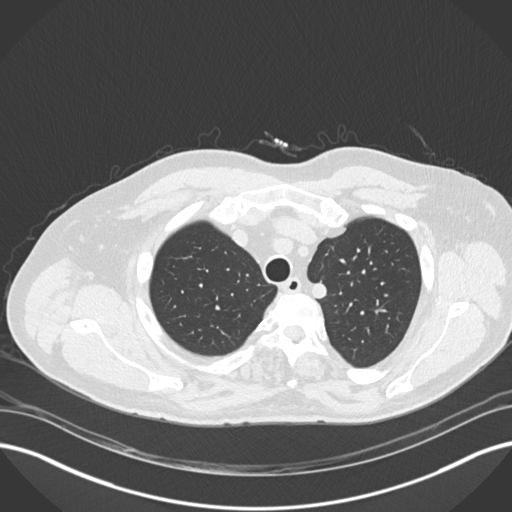
[im 127/147  lung]
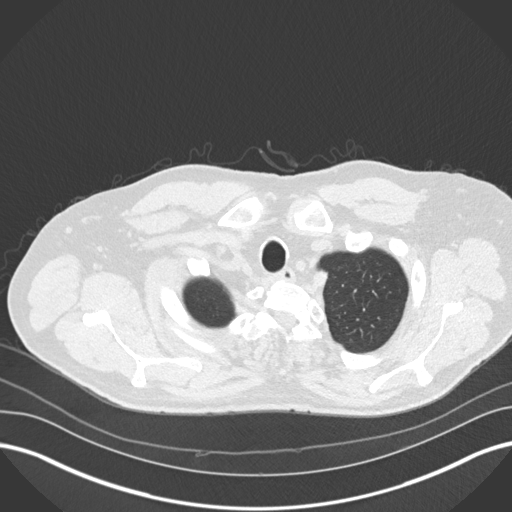
[im 140/147  lung]
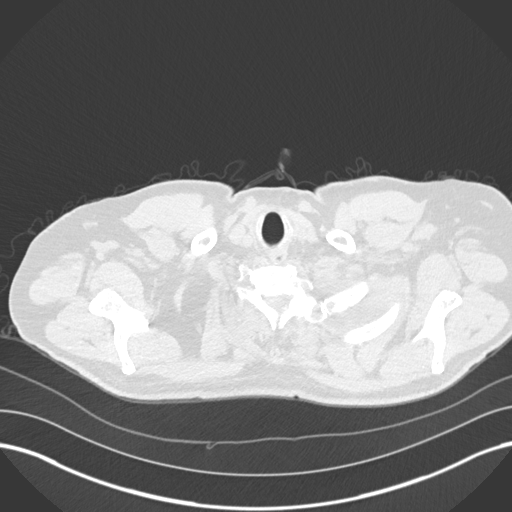

[Series 6: coronal · coronal · 0.61mm/px · 3 of 132 slices shown]
[im 27/132  lung]
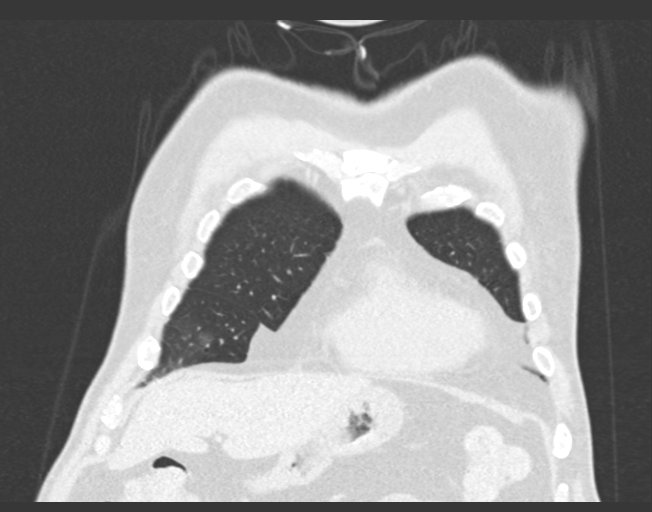
[im 53/132  lung]
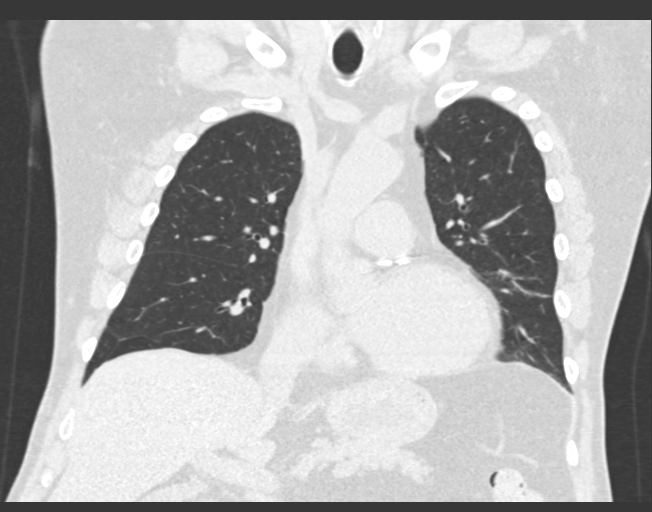
[im 79/132  lung]
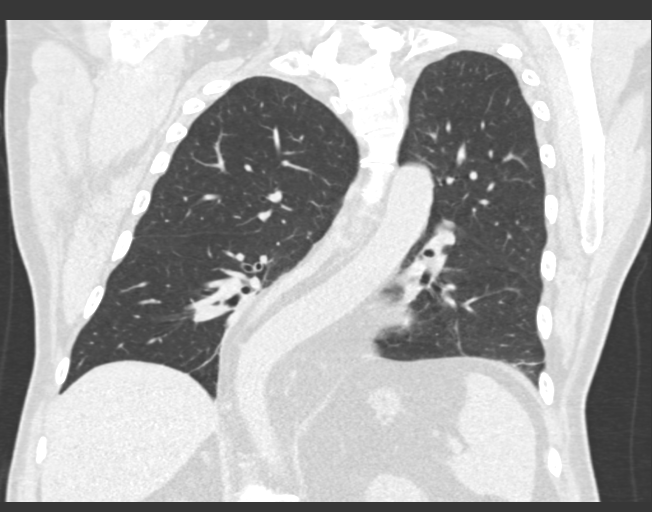

[15 of 36 positions shown; findings below may reference images not displayed]

FINDINGS: Cardiovascular: Atherosclerotic calcification of the arterial
vasculature, including extensive three-vessel involvement of the
coronary arteries. Heart is enlarged. No pericardial effusion.

Mediastinum/Nodes: No pathologically enlarged mediastinal or
axillary lymph nodes. Hilar regions are difficult to evaluate
without IV contrast but appear grossly unremarkable. Esophagus is
grossly unremarkable.

Lungs/Pleura: Scattered subpleural nodules in the lung bases measure
5 mm or less in size, unchanged and likely benign subpleural lymph
nodes. Mild subpleural scarring at the lung bases. No subpleural
reticulation, traction bronchiectasis/bronchiolectasis,
ground-glass, architectural distortion or honeycombing. 3 mm left
upper lobe nodule (image 34), unchanged and considered benign. No
pleural fluid. Airway is unremarkable. No air trapping.

Upper Abdomen: Subcentimeter low-attenuation lesion in the right
hepatic lobe is unchanged and likely benign. Visualized portions of
the liver, gallbladder, adrenal glands, left kidney, spleen,
pancreas, stomach and bowel are otherwise grossly unremarkable. No
upper abdominal adenopathy.

Musculoskeletal: Degenerative changes and scoliosis in the spine. No
worrisome lytic or sclerotic lesions.
IMPRESSION: 1. Mild bibasilar subpleural scarring. No definitive evidence of
interstitial lung disease.
2. Aortic atherosclerosis ([id number]-170.0). Extensive three-vessel
coronary artery calcification.

## 2018-11-12 ENCOUNTER — Other Ambulatory Visit: Payer: Self-pay | Admitting: Cardiology

## 2018-12-10 ENCOUNTER — Other Ambulatory Visit: Payer: Self-pay | Admitting: Cardiology

## 2018-12-10 MED ORDER — MEXILETINE HCL 150 MG PO CAPS: 150.0000 mg | ORAL_CAPSULE | Freq: Two times a day (BID) | ORAL | 2 refills | Status: DC

## 2018-12-25 ENCOUNTER — Telehealth: Payer: Self-pay

## 2018-12-25 NOTE — Telephone Encounter (Signed)
**Note De-Identified Kursten Kruk Obfuscation** I attempted to do a Mexiletine PA through covermymeds but was unable as Colletta Maryland at [employer] states that the NDC# is not correct and advised me to contact the pts pharmacy as they are running it under the wrong [place] #.  I called [facility] and was advised that the [facility] # they are running it under is the only one they have.  I then called [employer] and did a Mexiletine PA over the phone with Lennox Grumbles. Per Lennox Grumbles it can take up to 72 hrs for a determination. Reference#:[id number]

## 2018-12-26 NOTE — Telephone Encounter (Signed)
**Note De-Identified Willette Mudry Obfuscation** Letter received from [facility] stating that they have approved the pts Mexiletine PA. Approval good until 08/29/2019  I have notified the pts pharmacy ([facility]) of this approval.

## 2019-01-07 ENCOUNTER — Other Ambulatory Visit: Payer: Self-pay | Admitting: Cardiology

## 2019-01-22 ENCOUNTER — Other Ambulatory Visit: Payer: Self-pay | Admitting: Cardiology

## 2019-02-01 ENCOUNTER — Other Ambulatory Visit: Payer: Self-pay

## 2019-02-01 ENCOUNTER — Encounter (INDEPENDENT_AMBULATORY_CARE_PROVIDER_SITE_OTHER): Payer: Medicare HMO | Admitting: Ophthalmology

## 2019-02-01 DIAGNOSIS — H35033 Hypertensive retinopathy, bilateral: Secondary | ICD-10-CM

## 2019-02-01 DIAGNOSIS — I10 Essential (primary) hypertension: Secondary | ICD-10-CM

## 2019-02-01 DIAGNOSIS — H59032 Cystoid macular edema following cataract surgery, left eye: Secondary | ICD-10-CM | POA: Diagnosis not present

## 2019-02-01 DIAGNOSIS — H35373 Puckering of macula, bilateral: Secondary | ICD-10-CM | POA: Diagnosis not present

## 2019-02-01 DIAGNOSIS — H43813 Vitreous degeneration, bilateral: Secondary | ICD-10-CM

## 2019-02-01 DIAGNOSIS — H353132 Nonexudative age-related macular degeneration, bilateral, intermediate dry stage: Secondary | ICD-10-CM

## 2019-05-29 ENCOUNTER — Other Ambulatory Visit: Payer: Self-pay | Admitting: Cardiology

## 2019-08-05 ENCOUNTER — Telehealth: Payer: Self-pay | Admitting: Cardiology

## 2019-08-05 ENCOUNTER — Other Ambulatory Visit: Payer: Self-pay | Admitting: Cardiology

## 2019-08-05 ENCOUNTER — Encounter (INDEPENDENT_AMBULATORY_CARE_PROVIDER_SITE_OTHER): Payer: Medicare HMO | Admitting: Ophthalmology

## 2019-08-05 DIAGNOSIS — H43813 Vitreous degeneration, bilateral: Secondary | ICD-10-CM

## 2019-08-05 DIAGNOSIS — H35033 Hypertensive retinopathy, bilateral: Secondary | ICD-10-CM

## 2019-08-05 DIAGNOSIS — H35372 Puckering of macula, left eye: Secondary | ICD-10-CM | POA: Diagnosis not present

## 2019-08-05 DIAGNOSIS — H353132 Nonexudative age-related macular degeneration, bilateral, intermediate dry stage: Secondary | ICD-10-CM | POA: Diagnosis not present

## 2019-08-05 DIAGNOSIS — H59032 Cystoid macular edema following cataract surgery, left eye: Secondary | ICD-10-CM

## 2019-08-05 DIAGNOSIS — I10 Essential (primary) hypertension: Secondary | ICD-10-CM

## 2019-08-05 MED ORDER — PANTOPRAZOLE SODIUM 40 MG PO TBEC: 40.0000 mg | DELAYED_RELEASE_TABLET | Freq: Every day | ORAL | 0 refills | Status: DC

## 2019-08-05 MED ORDER — CLOPIDOGREL BISULFATE 75 MG PO TABS: ORAL_TABLET | ORAL | 0 refills | Status: DC

## 2019-08-05 NOTE — Telephone Encounter (Signed)
Pt's medications were sent to pt's pharmacy as requested. Confirmation received.  

## 2019-08-05 NOTE — Telephone Encounter (Signed)
New Message  Pt c/o medication issue:  1. Name of Medication: clopidogrel (PLAVIX) 75 MG tablet; pantoprazole (PROTONIX) 40 MG tablet  2. How are you currently taking this medication (dosage and times per day)? As written  3. Are you having a reaction (difficulty breathing--STAT)? No  4. What is your medication issue? Out of refills; needs new prescription

## 2019-08-16 ENCOUNTER — Other Ambulatory Visit: Payer: Self-pay | Admitting: Cardiology

## 2019-08-21 ENCOUNTER — Other Ambulatory Visit: Payer: Self-pay

## 2019-08-21 ENCOUNTER — Encounter: Payer: Self-pay | Admitting: Cardiology

## 2019-08-21 ENCOUNTER — Ambulatory Visit: Payer: Medicare HMO | Admitting: Cardiology

## 2019-08-21 VITALS — BP 116/82 | HR 79 | Ht 67.0 in | Wt 171.2 lb

## 2019-08-21 DIAGNOSIS — I493 Ventricular premature depolarization: Secondary | ICD-10-CM

## 2019-08-21 MED ORDER — CLOPIDOGREL BISULFATE 75 MG PO TABS: ORAL_TABLET | ORAL | 3 refills | Status: DC

## 2019-08-21 MED ORDER — PANTOPRAZOLE SODIUM 40 MG PO TBEC: 40.0000 mg | DELAYED_RELEASE_TABLET | Freq: Every day | ORAL | 3 refills | Status: AC

## 2019-08-21 NOTE — Progress Notes (Signed)
Electrophysiology Office Note   Date:  08/21/2019   ID:  Shaman, Maley 1950/10/17, MRN [id number]  PCP:  [name], MD  Cardiologist:  [name] Primary Electrophysiologist:  Constance Haw, MD    No chief complaint on file.    History of Present Illness: Duane Reyes is a 68 y.o. male who is being seen today for the evaluation of PVCs at the request of [name], MD. Presenting today for electrophysiology evaluation. He has history of coronary disease with coronary CTA showing significant calcification. Left heart cath 01/2016 showed 50% LAD, 25% OM 2, 95% PDA status post PCI. He has a history of PVCs. Was initially on Cardizem, but discontinued due to severe fatigue and bradycardia. He does have thyroid disease and has been on Synthroid. He wore a Holter monitor which showed 46% PVCs.  On mexiletine at his last visit.  Subsequent EKGs showed no further episodes of PVCs.  Today, denies symptoms of palpitations, chest pain, shortness of breath, orthopnea, PND, lower extremity edema, claudication, dizziness, presyncope, syncope, bleeding, or neurologic sequela. The patient is tolerating medications without difficulties.  Overall he is doing well.  He notices no further episodes of PVCs.  He is able to do all of his daily activities.  He has had a quite difficult year with family issues.  His mother died in 2023/05/01.  Aside from that, he has no major complaints.   Past Medical History:  Diagnosis Date  . Arthritis    "left knee" (01/29/2016)  . 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 Bronchiolitis   . CAD in native artery    a. Abnl cardiac CT -> LHC 01/2016 s/p 50% mLAD, 25% OM2, 95% RPDA which was treated with overlapping DES.  [name] Chronic lower back pain   . Dyspnea   . Frequent PVCs    a. 12/2015 - Holter showed SB, NSR, ST, HR 54-108, with frequent PVCs in singles, couplets, and bigemy, with elevated PVC load 24%.  [name] GERD (gastroesophageal reflux disease)   .  History of shingles   . Hyperlipidemia   . Hypertension   . Hypothyroidism   . Prostate cancer ([place])   . Tortuosity of aortic arch, -would not pursue Rt radial approach in this patient for future cardiac caths    Past Surgical History:  Procedure Laterality Date  . BACK SURGERY    . CARDIAC CATHETERIZATION N/A 01/29/2016   Procedure: Right/Left Heart Cath and Coronary Angiography;  Surgeon: Jettie Booze, MD;  Location: [facility];  Service: Cardiovascular;  Laterality: N/A;  . CARDIAC CATHETERIZATION N/A 01/29/2016   Procedure: Coronary Stent Intervention 2.5/16mm Synergy-proximal PDA;  Surgeon: Jettie Booze, MD;  Location: [facility];  Service: Cardiovascular;  Laterality: N/A;  . CATARACT EXTRACTION W/ INTRAOCULAR LENS IMPLANT Left 11/2015  . COLONOSCOPY    . CORONARY ANGIOPLASTY WITH STENT PLACEMENT  01/29/2016   "2 stents"  . CYST EXCISION Right 2005   "near bicep"  . ESOPHAGOGASTRODUODENOSCOPY    . EYE SURGERY    . JOINT REPLACEMENT    . KNEE ARTHROSCOPY Left 2012   "torn meniscus"  . POSTERIOR LUMBAR FUSION  02/2013   "L4-5; hardware in place"  . PROSTATE BIOPSY  2013  . PROSTATE SURGERY  2013   "brachytherapy for prostate radiation"  . TONSILLECTOMY  1950s  . TOTAL KNEE ARTHROPLASTY Left 12/18/2017  . TOTAL KNEE ARTHROPLASTY Left 12/18/2017   Procedure: TOTAL KNEE ARTHROPLASTY;  Surgeon: Vickey Huger,  MD;  Location: [facility];  Service: Orthopedics;  Laterality: Left;     Current Outpatient Medications  Medication Sig Dispense Refill  . acetaminophen (TYLENOL) 500 MG tablet Take 500 mg by mouth every 6 (six) hours as needed.    . Albuterol Sulfate (PROAIR HFA IN) Inhale 2 puffs into the lungs every 6 (six) hours as needed (for wheezing or shortness of breath).     [name] atorvastatin (LIPITOR) 40 MG tablet Take 1 tablet (40 mg total) by mouth daily at 6 PM. 30 tablet 6  . budesonide-formoterol (SYMBICORT) 80-4.5 MCG/ACT inhaler Inhale 2 puffs into the  lungs 2 (two) times daily as needed (for flares).     . Cholecalciferol (VITAMIN D-3 PO) Take 1 capsule by mouth 2 (two) times a week.    . clopidogrel (PLAVIX) 75 MG tablet TAKE 1 TABLET EVERY DAY WITH BREAKFAST. Please make overdue appt with [name] before anymore refills. 1st attempt 30 tablet 0  . Coenzyme Q10 (CO Q-10) 120 MG CAPS Take 120 mg by mouth 3 (three) times daily.    [name] levothyroxine (SYNTHROID, LEVOTHROID) 125 MCG tablet Take 125 mcg by mouth daily before breakfast.    . mexiletine (MEXITIL) 150 MG capsule Take 1 capsule (150 mg total) by mouth 2 (two) times daily. 180 capsule 2  . Multiple Vitamins-Minerals (PRESERVISION AREDS 2 PO) Take 1 capsule by mouth 2 (two) times daily.    . nitroGLYCERIN (NITROSTAT) 0.4 MG SL tablet Place 1 tablet (0.4 mg total) under the tongue every 5 (five) minutes as needed for chest pain (x 3 doses). 25 tablet 5  . pantoprazole (PROTONIX) 40 MG tablet Take 1 tablet (40 mg total) by mouth daily. Please make overdue appt with [name] before anymore refills. 1st attempt 30 tablet 0  . verapamil (CALAN-SR) 240 MG CR tablet Take 1 tablet (240 mg total) by mouth daily. 90 tablet 0   No current facility-administered medications for this visit.    Allergies:   Topiramate, Celecoxib, Pitavastatin, Rosuvastatin calcium, and Hydrocodone-acetaminophen   Social History:  The patient  reports that he has never smoked. He has never used smokeless tobacco. He reports current alcohol use. He reports that he does not use drugs.   Family History:  The patient's family history includes Heart attack in his father; Heart disease in his father.    ROS:  Please see the history of present illness.   Otherwise, review of systems is positive for none.   All other systems are reviewed and negative.   PHYSICAL EXAM: VS:  BP 116/82   Pulse 79   Ht 5\' 7"  (1.702 m)   Wt 171 lb 3.2 oz (77.7 kg)   SpO2 97%   BMI 26.81 kg/m  , BMI Body mass index is 26.81 kg/m. GEN:  Well nourished, well developed, in no acute distress  HEENT: normal  Neck: no JVD, carotid bruits, or masses Cardiac: RRR; no murmurs, rubs, or gallops,no edema  Respiratory:  clear to auscultation bilaterally, normal work of breathing GI: soft, nontender, nondistended, + BS MS: no deformity or atrophy  Skin: warm and dry Neuro:  Strength and sensation are intact Psych: euthymic mood, full affect  EKG:  EKG is ordered today. Personal review of the ekg ordered shows sinus rhythm, rate 79  Recent Labs: No results found for requested labs within last 8760 hours.    Lipid Panel     Component Value Date/Time   CHOL 149 10/26/2016 0927   TRIG 116  10/26/2016 0927   HDL 53 10/26/2016 0927   CHOLHDL 2.8 10/26/2016 0927   CHOLHDL 2.9 05/23/2016 0949   VLDL 18 05/23/2016 0949   LDLCALC 73 10/26/2016 0927     Wt Readings from Last 3 Encounters:  08/21/19 171 lb 3.2 oz (77.7 kg)  07/19/18 177 lb (80.3 kg)  04/18/18 176 lb 9.6 oz (80.1 kg)      Other studies Reviewed: Additional studies/ records that were reviewed today include: TTE 10/24/17 Review of the above records today demonstrates:  - Left ventricle: The cavity size was normal. Posterior wall   thickness was increased in a pattern of mild LVH. Systolic   function was normal. The estimated ejection fraction was in the   range of 50% to 55%. Wall motion was normal; there were no   regional wall motion abnormalities. Doppler parameters are   consistent with abnormal left ventricular relaxation (grade 1   diastolic dysfunction). Doppler parameters are consistent with   high ventricular filling pressure. - Aortic valve: Transvalvular velocity was within the normal range.   There was no stenosis. There was no regurgitation. - Mitral valve: Transvalvular velocity was within the normal range.   There was no evidence for stenosis. There was trivial   regurgitation. - Right ventricle: The cavity size was normal. Wall thickness  was   normal. Systolic function was normal. - Atrial septum: No defect or patent foramen ovale was identified. - Tricuspid valve: There was no regurgitation.  Cath 01/29/16  Mid LAD lesion, 50% stenosed.  2nd Mrg lesion, 25% stenosed.  The left ventricular systolic function is normal.  RPDA lesion, 95% stenosed. Post intervention with overlapping drug eluting stents postdilated to > 3 mm, there is a 0% residual stenosis.  Due to severe tortuosity in his aortic arch, would not pursue right radial approach in this patient in the future if catheterization was needed.  Normal pulmonary artery pressures.     Normal sinus rhythm with average heart rate 83bpm. The heart rate ranged from 55-116bpm.  Frequent PVCs, bigeminal PVCs, ventricular couplets and salvos.  Occasional PACs  PVC load was 46%  Holter 06/02/17 - personally reviewed Minimum HR: 49 BPM at 7:12:05 AM(2) Maximum HR: 123 BPM at 12:40:53 PM(2) Average HR: 75 BPM 31% PVCs <1% APCs Sinus rhythm  One run of 10 beats of SVT Zero atrial fibrillation  SPECT 10/10/17  Nuclear stress EF: 27%.  There was no ST segment deviation noted during stress.  Findings consistent with prior myocardial infarction.  This is a high risk study.  The left ventricular ejection fraction is severely decreased (<30%).   Anterior and apical as well as inferoapical infarct no ischemia EF 27%  ASSESSMENT AND PLAN:  1.  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  ECG is without PVCs today.  He is currently feeling well.  No changes.  2. Coronary artery disease with chronic stable angina: Status post PCI to the RCA.  No current chest pain.  3. Hypertension: Currently well controlled  4. Hypothyroidism: Continue Synthroid.  Attempting to avoid amiodarone.  Current medicines are reviewed at length with the patient today.   The patient does not have concerns regarding his medicines.  The following changes were made today: None  Labs/ tests  ordered today include:  Orders Placed This Encounter  Procedures  . EKG 12-Lead     Disposition:   FU with Archibald Marchetta 12 months  Signed, Chia Mowers Meredith Leeds, MD  08/21/2019 2:58 PM     [facility]  [address] [place] [address] [phone] (office) [phone] (fax)

## 2019-08-21 NOTE — Patient Instructions (Addendum)
Medication Instructions:  Your physician recommends that you continue on your current medications as directed. Please refer to the Current Medication list given to you today.  * If you need a refill on your cardiac medications before your next appointment, please call your pharmacy.   Labwork: None ordered  Testing/Procedures: None ordered  Follow-Up: At [facility], you and your health needs are our priority.  As part of our continuing mission to provide you with exceptional heart care, we have created designated Provider Care Teams.  These Care Teams include your primary Cardiologist (physician) and Advanced Practice Providers (APPs -  Physician Assistants and Nurse Practitioners) who all work together to provide you with the care you need, when you need it.  You will need a follow up appointment in 1 year.  Please call our office 2 months in advance to schedule this appointment.  You may see Dr Curt Bears or one of the following Advanced Practice Providers on your designated Care Team:    Chanetta Marshall, NP  Tommye Standard, PA-C  Oda Kilts, [place]  Thank you for choosing [facility]!!   Trinidad Curet, RN [phone]

## 2019-09-10 ENCOUNTER — Other Ambulatory Visit: Payer: Self-pay

## 2019-09-10 ENCOUNTER — Ambulatory Visit: Payer: Medicare HMO | Attending: Internal Medicine

## 2019-09-10 DIAGNOSIS — Z20822 Contact with and (suspected) exposure to covid-19: Secondary | ICD-10-CM

## 2019-09-11 ENCOUNTER — Telehealth: Payer: Self-pay | Admitting: Family Medicine

## 2019-09-11 LAB — NOVEL CORONAVIRUS, NAA: SARS-CoV-2, NAA: DETECTED — AB

## 2019-09-11 NOTE — Telephone Encounter (Signed)
Patient called about Positive Covid test from date testing event. The patient is not having any symptoms at this time.  Patient understands the needs to stay in isolation for a total of 10 days from onset of symptom or 14 days total from date of testing if no symptom. Patient knows the health department may be in touch.  I answered all of patient's questions and all concerns addressed.  Donia Pounds  APRN, MSN, FNP-C [facility] [address] [place], [place] [address] [phone]

## 2019-09-13 ENCOUNTER — Telehealth: Payer: Self-pay | Admitting: Nurse Practitioner

## 2019-09-13 NOTE — Telephone Encounter (Signed)
Called to Discuss with patient about Covid symptoms and the use of bamlanivimab, a monoclonal antibody infusion for those with mild to moderate Covid symptoms and at a high risk of hospitalization.     Patient states that his symptoms started 2 weeks ago.

## 2019-09-23 ENCOUNTER — Ambulatory Visit: Payer: Medicare HMO | Admitting: Cardiology

## 2019-10-07 ENCOUNTER — Ambulatory Visit: Payer: Medicare HMO | Attending: Internal Medicine

## 2019-10-07 ENCOUNTER — Other Ambulatory Visit: Payer: Self-pay

## 2019-10-07 DIAGNOSIS — Z20822 Contact with and (suspected) exposure to covid-19: Secondary | ICD-10-CM

## 2019-10-08 LAB — NOVEL CORONAVIRUS, NAA: SARS-CoV-2, NAA: NOT DETECTED

## 2019-10-19 ENCOUNTER — Other Ambulatory Visit: Payer: Self-pay | Admitting: Cardiology

## 2020-02-03 ENCOUNTER — Other Ambulatory Visit: Payer: Self-pay

## 2020-02-03 ENCOUNTER — Encounter (INDEPENDENT_AMBULATORY_CARE_PROVIDER_SITE_OTHER): Payer: Medicare HMO | Admitting: Ophthalmology

## 2020-02-03 DIAGNOSIS — H59032 Cystoid macular edema following cataract surgery, left eye: Secondary | ICD-10-CM

## 2020-02-03 DIAGNOSIS — I10 Essential (primary) hypertension: Secondary | ICD-10-CM | POA: Diagnosis not present

## 2020-02-03 DIAGNOSIS — H35033 Hypertensive retinopathy, bilateral: Secondary | ICD-10-CM | POA: Diagnosis not present

## 2020-02-03 DIAGNOSIS — H2511 Age-related nuclear cataract, right eye: Secondary | ICD-10-CM

## 2020-02-03 DIAGNOSIS — H35372 Puckering of macula, left eye: Secondary | ICD-10-CM

## 2020-02-03 DIAGNOSIS — H43813 Vitreous degeneration, bilateral: Secondary | ICD-10-CM

## 2020-02-03 DIAGNOSIS — H353132 Nonexudative age-related macular degeneration, bilateral, intermediate dry stage: Secondary | ICD-10-CM | POA: Diagnosis not present

## 2020-03-16 ENCOUNTER — Other Ambulatory Visit: Payer: Self-pay

## 2020-03-16 ENCOUNTER — Encounter (INDEPENDENT_AMBULATORY_CARE_PROVIDER_SITE_OTHER): Payer: Medicare HMO | Admitting: Ophthalmology

## 2020-03-16 DIAGNOSIS — H35033 Hypertensive retinopathy, bilateral: Secondary | ICD-10-CM | POA: Diagnosis not present

## 2020-03-16 DIAGNOSIS — H59032 Cystoid macular edema following cataract surgery, left eye: Secondary | ICD-10-CM

## 2020-03-16 DIAGNOSIS — H353132 Nonexudative age-related macular degeneration, bilateral, intermediate dry stage: Secondary | ICD-10-CM | POA: Diagnosis not present

## 2020-03-16 DIAGNOSIS — I10 Essential (primary) hypertension: Secondary | ICD-10-CM

## 2020-03-16 DIAGNOSIS — H43813 Vitreous degeneration, bilateral: Secondary | ICD-10-CM

## 2020-05-18 ENCOUNTER — Other Ambulatory Visit: Payer: Self-pay

## 2020-05-18 MED ORDER — CLOPIDOGREL BISULFATE 75 MG PO TABS: ORAL_TABLET | ORAL | 0 refills | Status: DC

## 2020-05-25 ENCOUNTER — Telehealth: Payer: Self-pay | Admitting: *Deleted

## 2020-05-25 NOTE — Telephone Encounter (Signed)
   [facility] Pre-operative Risk Assessment    HEARTCARE STAFF: - Please ensure there is not already an duplicate clearance open for this procedure. - Under Visit Info/Reason for Call, type in Other and utilize the format Clearance MM/DD/YY or Clearance TBD. Do not use dashes or single digits. - If request is for dental extraction, please clarify the # of teeth to be extracted.  Request for surgical clearance:  1. What type of surgery is being performed? T12-L1 TRANSLAMINAR EPIDURAL STEROID INJECTION   2. When is this surgery scheduled? 06/04/20   3. What type of clearance is required (medical clearance vs. Pharmacy clearance to hold med vs. Both)? MEDICAL  4. Are there any medications that need to be held prior to surgery and how long? PLAVIX   5. Practice name and name of physician performing surgery? [facility]; DR. Kristeen Miss   6. What is the office phone number? [phone]   7.   What is the office fax number? Wing: JESSICA  8.   Anesthesia type (None, local, MAC, general) ? IV SEDATION   Julaine Hua 05/25/2020, 1:50 PM  _________________________________________________________________   (provider comments below)

## 2020-05-26 NOTE — Telephone Encounter (Signed)
[place] this patient needs a pre op clearance appointment. Can you assist with this?

## 2020-05-26 NOTE — Telephone Encounter (Signed)
   Primary Cardiologist: Fransico Him, MD  Chart reviewed as part of pre-operative protocol coverage. Because of Duane Reyes's past medical history and time since last visit, they will require a follow-up visit in order to better assess preoperative cardiovascular risk.  Pre-op covering staff: - Please schedule appointment and call patient to inform them. If patient already had an upcoming appointment within acceptable timeframe, please add "pre-op clearance" to the appointment notes so provider is aware. - Please contact requesting surgeon's office via preferred method (i.e, phone, fax) to inform them of need for appointment prior to surgery.   [place], [place]  05/26/2020, 1:50 PM

## 2020-05-27 NOTE — Telephone Encounter (Signed)
Pt actually got a sooner appt 06/02/2020 and clearance will be addressed at that visit.   Will route back to the requesting surgeon's office to make them aware of sooner appointment.

## 2020-05-27 NOTE — Telephone Encounter (Signed)
Pt has been scheduled to see Any Tillery, PA-C 06/10/2020 and clearance will be addressed at that time.  Pt has been put on a cancellation list if something comes up sooner.  Will route back to the requesting surgeon's office to make them aware.

## 2020-06-01 NOTE — Progress Notes (Signed)
Cardiology Office Note Date:  06/01/2020  Patient ID:  Duane Reyes 11/06/1950, MRN [id number] PCP:  [name], MD  Cardiologist:  [name] (2019, last gen cards visit) Electrophysiologist: [name]    Chief Complaint: pre-op clearance  History of Present Illness: Duane Reyes is a 69 y.o. male with history of CAD (LHC6/2017and he was found to have 50% mid LAD, 25% OM2, 95% RPDA s/p PCI), SOB > abnormal PFTs >> seen by Dr. Malvin Johns. He reports he was felt to have restricted breathing secondary to scoliosis. No COPD, PVCs, hypothyroidism, HTN, HLD   He comes today to be seen for [name], pre-op for epidural injection.  She last saw [name] Dec 2020.  He was feeling well, no perceived PVCs/palpitations, able to do all of his ADLs.  His mother had passed away and had some family issues, though physically feeling well. Mentioned trying to avoid amio given baseline hypothyroidism.  TODAY He is doing well.  He has at baseline some DOE for years that has been felt 2/2 his scoliosis/mechanical issue.   He is pending an epidural, mentions that he had back surgery a few years back and is starting to have some pain again. He remains quite active though, walks 3-4 days a week for about 2 minutes at a brisk pace with no changes in his exertional capacity. No CP, rarely feels a brief skip in his beat. No dizzy spells, near syncope or syncope.  He does most of the care for his property, remains physically active, yesterday changed out a toilet at the curch and started on some of his own pluming issues.     RCRI score is one, 0.9%   PVCs holter 2018 noted 46% PVCs AAD Hx Mexiletine started Nov 2018 is current Intolerant of betablockers 2/2 severe fatigue and bradycardia  Past Medical History:  Diagnosis Date  . Arthritis    "left knee" (01/29/2016)  . 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 Bronchiolitis   . CAD in native artery    a. Abnl cardiac CT -> LHC  01/2016 s/p 50% mLAD, 25% OM2, 95% RPDA which was treated with overlapping DES.  [name] Chronic lower back pain   . Dyspnea   . Frequent PVCs    a. 12/2015 - Holter showed SB, NSR, ST, HR 54-108, with frequent PVCs in singles, couplets, and bigemy, with elevated PVC load 24%.  [name] GERD (gastroesophageal reflux disease)   . History of shingles   . Hyperlipidemia   . Hypertension   . Hypothyroidism   . Prostate cancer ([place])   . Tortuosity of aortic arch, -would not pursue Rt radial approach in this patient for future cardiac caths     Past Surgical History:  Procedure Laterality Date  . BACK SURGERY    . CARDIAC CATHETERIZATION N/A 01/29/2016   Procedure: Right/Left Heart Cath and Coronary Angiography;  Surgeon: Jettie Booze, MD;  Location: [facility];  Service: Cardiovascular;  Laterality: N/A;  . CARDIAC CATHETERIZATION N/A 01/29/2016   Procedure: Coronary Stent Intervention 2.5/16mm Synergy-proximal PDA;  Surgeon: Jettie Booze, MD;  Location: [facility];  Service: Cardiovascular;  Laterality: N/A;  . CATARACT EXTRACTION W/ INTRAOCULAR LENS IMPLANT Left 11/2015  . COLONOSCOPY    . CORONARY ANGIOPLASTY WITH STENT PLACEMENT  01/29/2016   "2 stents"  . CYST EXCISION Right 2005   "near bicep"  . ESOPHAGOGASTRODUODENOSCOPY    . EYE SURGERY    . JOINT REPLACEMENT    .  KNEE ARTHROSCOPY Left 2012   "torn meniscus"  . POSTERIOR LUMBAR FUSION  02/2013   "L4-5; hardware in place"  . PROSTATE BIOPSY  2013  . PROSTATE SURGERY  2013   "brachytherapy for prostate radiation"  . TONSILLECTOMY  1950s  . TOTAL KNEE ARTHROPLASTY Left 12/18/2017  . TOTAL KNEE ARTHROPLASTY Left 12/18/2017   Procedure: TOTAL KNEE ARTHROPLASTY;  Surgeon: Vickey Huger, MD;  Location: [facility];  Service: Orthopedics;  Laterality: Left;    Current Outpatient Medications  Medication Sig Dispense Refill  . acetaminophen (TYLENOL) 500 MG tablet Take 500 mg by mouth every 6 (six) hours as needed.    .  Albuterol Sulfate (PROAIR HFA IN) Inhale 2 puffs into the lungs every 6 (six) hours as needed (for wheezing or shortness of breath).     [name] atorvastatin (LIPITOR) 40 MG tablet Take 1 tablet (40 mg total) by mouth daily at 6 PM. 30 tablet 6  . budesonide-formoterol (SYMBICORT) 80-4.5 MCG/ACT inhaler Inhale 2 puffs into the lungs 2 (two) times daily as needed (for flares).     . Cholecalciferol (VITAMIN D-3 PO) Take 1 capsule by mouth 2 (two) times a week.    . clopidogrel (PLAVIX) 75 MG tablet Take 1 tablet daily with breakfast 90 tablet 0  . Coenzyme Q10 (CO Q-10) 120 MG CAPS Take 120 mg by mouth 3 (three) times daily.    [name] levothyroxine (SYNTHROID, LEVOTHROID) 125 MCG tablet Take 125 mcg by mouth daily before breakfast.    . mexiletine (MEXITIL) 150 MG capsule Take 1 capsule (150 mg total) by mouth 2 (two) times daily. 180 capsule 2  . Multiple Vitamins-Minerals (PRESERVISION AREDS 2 PO) Take 1 capsule by mouth 2 (two) times daily.    . nitroGLYCERIN (NITROSTAT) 0.4 MG SL tablet Place 1 tablet (0.4 mg total) under the tongue every 5 (five) minutes as needed for chest pain (x 3 doses). 25 tablet 5  . pantoprazole (PROTONIX) 40 MG tablet Take 1 tablet (40 mg total) by mouth daily. 90 tablet 3  . verapamil (CALAN-SR) 240 MG CR tablet TAKE 1 TABLET EVERY DAY 90 tablet 3   No current facility-administered medications for this visit.    Allergies:   Topiramate, Celecoxib, Pitavastatin, Rosuvastatin calcium, and Hydrocodone-acetaminophen   Social History:  The patient  reports that he has never smoked. He has never used smokeless tobacco. He reports current alcohol use. He reports that he does not use drugs.   Family History:  The patient's family history includes Heart attack in his father; Heart disease in his father.  ROS:  Please see the history of present illness.    All other systems are reviewed and otherwise negative.   PHYSICAL EXAM:  VS:  There were no vitals taken for this visit. BMI:  There is no height or weight on file to calculate BMI. Well nourished, well developed, in no acute distress HEENT: normocephalic, atraumatic Neck: no JVD, carotid bruits or masses Cardiac:  RRR; a few extrasystoles are appreciated, no significant murmurs, no rubs, or gallops Lungs:  CTA b/l, no wheezing, rhonchi or rales Abd: soft, nontender MS: scoliosis is appreciated otherwise no deformity or atrophy Ext: no edema Skin: warm and dry, no rash Neuro:  No gross deficits appreciated Psych: euthymic mood, full affect    EKG:  Done today and reviewed by myself shows  SR, no ST/T changes, PVCs  10/24/2017; TTE Study Conclusions  - Left ventricle: The cavity size was normal. Posterior wall  thickness was  increased in a pattern of mild LVH. Systolic  function was normal. The estimated ejection fraction was in the  range of 50% to 55%. Wall motion was normal; there were no  regional wall motion abnormalities. Doppler parameters are  consistent with abnormal left ventricular relaxation (grade 1  diastolic dysfunction). Doppler parameters are consistent with  high ventricular filling pressure.  - Aortic valve: Transvalvular velocity was within the normal range.  There was no stenosis. There was no regurgitation.  - Mitral valve: Transvalvular velocity was within the normal range.  There was no evidence for stenosis. There was trivial  regurgitation.  - Right ventricle: The cavity size was normal. Wall thickness was  normal. Systolic function was normal.  - Atrial septum: No defect or patent foramen ovale was identified.  - Tricuspid valve: There was no regurgitation.   Impressions:  - Frequent ectopy makes systolic function difficult to assess but  appears to be very mildly reduced.      10/10/2017: stress myoview  Nuclear stress EF: 27%.  There was no ST segment deviation noted during stress.  Findings consistent with prior myocardial  infarction.  This is a high risk study.  The left ventricular ejection fraction is severely decreased (<30%).   Anterior and apical as well as inferoapical infarct no ischemia EF 27%   Holter 06/02/17  Minimum HR: 49 BPM at 7:12:05 AM(2) Maximum HR: 123 BPM at 12:40:53 PM(2) Average HR: 75 BPM 31% PVCs <1% APCs Sinus rhythm  One run of 10 beats of SVT Zero atrial fibrillation   12/19/2016 48 hour monitor  Normal sinus rhythm with average heart rate 83bpm. The heart rate ranged from 55-116bpm.  Frequent PVCs, bigeminal PVCs, ventricular couplets and salvos.  Occasional PACs  PVC load was 46%   01/29/2016; LHC/PCI  Mid LAD lesion, 50% stenosed.  2nd Mrg lesion, 25% stenosed.  The left ventricular systolic function is normal.  RPDA lesion, 95% stenosed. Post intervention with overlapping drug eluting stents postdilated to > 3 mm, there is a 0% residual stenosis.  Due to severe tortuosity in his aortic arch, would not pursue right radial approach in this patient in the future if catheterization was needed.  Normal pulmonary artery pressures.   The patient we watched overnight. Continue dual antiplatelet therapy for at least a year. He'll need aggressive secondary prevention. Plan discharge tomorrow.    Recent Labs: No results found for requested labs within last 8760 hours.  No results found for requested labs within last 8760 hours.   CrCl cannot be calculated (Patient's most recent lab result is older than the maximum 21 days allowed.).   Wt Readings from Last 3 Encounters:  08/21/19 171 lb 3.2 oz (77.7 kg)  07/19/18 177 lb (80.3 kg)  04/18/18 176 lb 9.6 oz (80.1 kg)     Other studies reviewed: Additional studies/records reviewed today include: summarized above  ASSESSMENT AND PLAN:  1. CAD     PCI in 2017     No anginal symptoms     On clopidogrel, statin, CCB (intolerant of BB)  2. PVCs     on mexiletine     No awareness of his PVCs today       Given he is unaware of his PVCs today, not certain symptoms are reliable in evaluation PVC suppression.  No symptoms to suggest and clinical change otherwise  Will go ahead and check PVC burden with 2 week XT monitor If his burden is up, will plan to titrate his  mexiletine This will not need to be done prior to his epidural   3. HTN     No changes today  4. HLD     Monitored and managed with his PMD  5. Pre-op     Epidural     Low risk procedure and low cardiac risk score     No need for pre-procedure cardiac/ischemic testing     Stable and good exertional capacity, no symptoms to suggest change     No objection to brief interruption in his clopidogrel, to resume when cleared to by his neurologist/surgeon, he reports total off will be 2 weeks, as instructed by his neurosurgeon     Disposition: F/u with [place] in 60mo, sooner if needed, pending his monitor findings  Current medicines are reviewed at length with the patient today.  The patient did not have any concerns regarding medicines.  Venetia Night, PA-C 06/01/2020 10:21 AM     [address] [place] [address] [phone] (office)  [phone] (fax)

## 2020-06-02 ENCOUNTER — Ambulatory Visit: Payer: Medicare HMO | Admitting: Cardiology

## 2020-06-02 ENCOUNTER — Ambulatory Visit: Payer: Medicare HMO | Admitting: Physician Assistant

## 2020-06-02 ENCOUNTER — Telehealth: Payer: Self-pay | Admitting: Radiology

## 2020-06-02 ENCOUNTER — Other Ambulatory Visit: Payer: Self-pay

## 2020-06-02 VITALS — BP 144/62 | HR 85 | Ht 67.0 in | Wt 176.0 lb

## 2020-06-02 DIAGNOSIS — I10 Essential (primary) hypertension: Secondary | ICD-10-CM

## 2020-06-02 DIAGNOSIS — I2510 Atherosclerotic heart disease of native coronary artery without angina pectoris: Secondary | ICD-10-CM | POA: Diagnosis not present

## 2020-06-02 DIAGNOSIS — I493 Ventricular premature depolarization: Secondary | ICD-10-CM

## 2020-06-02 DIAGNOSIS — Z01818 Encounter for other preprocedural examination: Secondary | ICD-10-CM | POA: Diagnosis not present

## 2020-06-02 DIAGNOSIS — E785 Hyperlipidemia, unspecified: Secondary | ICD-10-CM

## 2020-06-02 NOTE — Telephone Encounter (Signed)
Enrolled patient for a 14 day Zio XT Monitor to be mailed to patients home  

## 2020-06-02 NOTE — Patient Instructions (Addendum)
Medication Instructions:  Your physician recommends that you continue on your current medications as directed. Please refer to the Current Medication list given to you today.  *If you need a refill on your cardiac medications before your next appointment, please call your pharmacy*   Lab Work: [place]   If you have labs (blood work) drawn today and your tests are completely normal, you will receive your results only by: [name] MyChart Message (if you have MyChart) OR . A paper copy in the mail If you have any lab test that is abnormal or we need to change your treatment, we will call you to review the results.   Testing/Procedures: Your physician has recommended that you wear an event monitor. Event monitors are medical devices that record the heart's electrical activity. Doctors most often [place] these monitors to diagnose arrhythmias. Arrhythmias are problems with the speed or rhythm of the heartbeat. The monitor is a small, portable device. You can wear one while you do your normal daily activities. This is usually used to diagnose what is causing palpitations/syncope (passing out).    Follow-Up: At [facility], you and your health needs are our priority.  As part of our continuing mission to provide you with exceptional heart care, we have created designated Provider Care Teams.  These Care Teams include your primary Cardiologist (physician) and Advanced Practice Providers (APPs -  Physician Assistants and Nurse Practitioners) who all work together to provide you with the care you need, when you need it.  We recommend signing up for the patient portal called "MyChart".  Sign up information is provided on this After Visit Summary.  MyChart is used to connect with patients for Virtual Visits (Telemedicine).  Patients are able to view lab/test results, encounter notes, upcoming appointments, etc.  Non-urgent messages can be sent to your provider as well.   To learn more about what you  can do with MyChart, go to [email or web address].    Your next appointment:   6 month(s)  The format for your next appointment:   In Person  Provider:   You may see [name], MD or one of the following Advanced Practice Providers on your designated Care Team:    Chanetta Marshall, NP  Tommye Standard, [place]  Legrand Como "[name]" [place], [place]    Other Instructions \\ZIO  XT- Long Term Monitor Instructions   Your physician has requested you wear your ZIO patch monitor___14____days.   This is a single patch monitor.  [employer] supplies one patch monitor per enrollment.  Additional stickers are not available.   Please do not apply patch if you will be having a Nuclear Stress Test, Echocardiogram, Cardiac CT, MRI, or Chest Xray during the time frame you would be wearing the monitor. The patch cannot be worn during these tests.  You cannot remove and re-apply the ZIO XT patch monitor.   Your ZIO patch monitor will be sent USPS Priority mail from [facility] directly to your home address. The monitor may also be mailed to a PO BOX if home delivery is not available.   It may take 3-5 days to receive your monitor after you have been enrolled.   Once you have received you monitor, please review enclosed instructions.  Your monitor has already been registered assigning a specific monitor serial # to you.   Applying the monitor   Shave hair from upper left chest.   Hold abrader disc by orange tab.  Rub abrader in 40 strokes over left  upper chest as indicated in your monitor instructions.   Clean area with 4 enclosed alcohol pads .  Use all pads to assure are is cleaned thoroughly.  Let dry.   Apply patch as indicated in monitor instructions.  Patch will be place under collarbone on left side of chest with arrow pointing upward.   Rub patch adhesive wings for 2 minutes.Remove white label marked "1".  Remove white label marked "2".  Rub patch adhesive wings for 2 additional minutes.    While looking in a mirror, press and release button in center of patch.  A small green light will flash 3-4 times .  This will be your only indicator the monitor has been turned on.     Do not shower for the first 24 hours.  You may shower after the first 24 hours.   Press button if you feel a symptom. You will hear a small click.  Record Date, Time and Symptom in the Patient Log Book.   When you are ready to remove patch, follow instructions on last 2 pages of Patient Log Book.  Stick patch monitor onto last page of Patient Log Book.   Place Patient Log Book in [employer] box.  Use locking tab on box and tape box closed securely.  The [employer] and [employer] has [employer] on it.  Please place in mailbox as soon as possible.  Your physician should have your test results approximately 7 days after the monitor has been mailed back to [facility].   Call [place] at [phone] if you have questions regarding your ZIO XT patch monitor.  Call them immediately if you see an orange light blinking on your monitor.   If your monitor falls off in less than 4 days contact our Monitor department at [phone].  If your monitor becomes loose or falls off after 4 days call [employer] at [phone] for suggestions on securing your monitor.

## 2020-06-05 ENCOUNTER — Other Ambulatory Visit (INDEPENDENT_AMBULATORY_CARE_PROVIDER_SITE_OTHER): Payer: Medicare HMO

## 2020-06-05 DIAGNOSIS — I493 Ventricular premature depolarization: Secondary | ICD-10-CM

## 2020-06-10 ENCOUNTER — Ambulatory Visit: Payer: Medicare HMO | Admitting: Student

## 2020-07-14 ENCOUNTER — Other Ambulatory Visit: Payer: Self-pay

## 2020-07-14 MED ORDER — MEXILETINE HCL 150 MG PO CAPS: 150.0000 mg | ORAL_CAPSULE | Freq: Two times a day (BID) | ORAL | 3 refills | Status: DC

## 2020-07-24 ENCOUNTER — Other Ambulatory Visit: Payer: Self-pay | Admitting: Cardiology

## 2020-08-20 ENCOUNTER — Other Ambulatory Visit: Payer: Self-pay | Admitting: Cardiology

## 2020-09-07 ENCOUNTER — Telehealth: Payer: Self-pay | Admitting: Gastroenterology

## 2020-09-07 NOTE — Telephone Encounter (Signed)
Hi Dr. Loletha Carrow,   We received a referral from PCP for patient to be seen for abdominal pain  And slow transit constipation. Patient is currently with [facility] and the patient is requesting [employer] due to location.   Please advise on scheduling.   Thank you

## 2020-09-09 NOTE — Telephone Encounter (Signed)
He has been placed on your schedule for 10/20/20 he is super excited and looking forward to the visit.   Thank you

## 2020-09-09 NOTE — Telephone Encounter (Signed)
I will be glad to see him.  Next available new patient appointment with me.  - HD

## 2020-09-15 ENCOUNTER — Encounter (HOSPITAL_COMMUNITY): Payer: Self-pay

## 2020-09-15 ENCOUNTER — Emergency Department (HOSPITAL_COMMUNITY)
Admission: EM | Admit: 2020-09-15 | Discharge: 2020-09-15 | Disposition: A | Discharge status: Home / Self Care | Payer: Medicare HMO | Attending: Emergency Medicine | Admitting: Emergency Medicine

## 2020-09-15 ENCOUNTER — Other Ambulatory Visit: Payer: Self-pay

## 2020-09-15 ENCOUNTER — Emergency Department (HOSPITAL_COMMUNITY): Payer: Medicare HMO

## 2020-09-15 DIAGNOSIS — D110 Benign neoplasm of parotid gland: Secondary | ICD-10-CM | POA: Insufficient documentation

## 2020-09-15 DIAGNOSIS — Z79899 Other long term (current) drug therapy: Secondary | ICD-10-CM | POA: Insufficient documentation

## 2020-09-15 DIAGNOSIS — Z8549 Personal history of malignant neoplasm of other male genital organs: Secondary | ICD-10-CM | POA: Diagnosis not present

## 2020-09-15 DIAGNOSIS — E039 Hypothyroidism, unspecified: Secondary | ICD-10-CM | POA: Insufficient documentation

## 2020-09-15 DIAGNOSIS — S60519A Abrasion of unspecified hand, initial encounter: Secondary | ICD-10-CM | POA: Insufficient documentation

## 2020-09-15 DIAGNOSIS — I2510 Atherosclerotic heart disease of native coronary artery without angina pectoris: Secondary | ICD-10-CM | POA: Diagnosis not present

## 2020-09-15 DIAGNOSIS — Z951 Presence of aortocoronary bypass graft: Secondary | ICD-10-CM | POA: Diagnosis not present

## 2020-09-15 DIAGNOSIS — S0093XA Contusion of unspecified part of head, initial encounter: Secondary | ICD-10-CM | POA: Insufficient documentation

## 2020-09-15 DIAGNOSIS — W009XXA Unspecified fall due to ice and snow, initial encounter: Secondary | ICD-10-CM | POA: Diagnosis not present

## 2020-09-15 DIAGNOSIS — R519 Headache, unspecified: Secondary | ICD-10-CM | POA: Insufficient documentation

## 2020-09-15 DIAGNOSIS — Z96652 Presence of left artificial knee joint: Secondary | ICD-10-CM | POA: Insufficient documentation

## 2020-09-15 DIAGNOSIS — S6990XA Unspecified injury of unspecified wrist, hand and finger(s), initial encounter: Secondary | ICD-10-CM | POA: Diagnosis present

## 2020-09-15 DIAGNOSIS — I10 Essential (primary) hypertension: Secondary | ICD-10-CM | POA: Diagnosis not present

## 2020-09-15 DIAGNOSIS — Z8546 Personal history of malignant neoplasm of prostate: Secondary | ICD-10-CM | POA: Insufficient documentation

## 2020-09-15 DIAGNOSIS — W19XXXA Unspecified fall, initial encounter: Secondary | ICD-10-CM

## 2020-09-15 DIAGNOSIS — K118 Other diseases of salivary glands: Secondary | ICD-10-CM

## 2020-09-15 LAB — CBC WITH DIFFERENTIAL/PLATELET
Abs Immature Granulocytes: 0.06 10*3/uL (ref 0.00–0.07)
Basophils Absolute: 0 10*3/uL (ref 0.0–0.1)
Basophils Relative: 0 %
Eosinophils Absolute: 0.1 10*3/uL (ref 0.0–0.5)
Eosinophils Relative: 1 %
HCT: 47.3 % (ref 39.0–52.0)
Hemoglobin: 15.2 g/dL (ref 13.0–17.0)
Immature Granulocytes: 1 %
Lymphocytes Relative: 6 %
Lymphs Abs: 0.7 10*3/uL (ref 0.7–4.0)
MCH: 32 pg (ref 26.0–34.0)
MCHC: 32.1 g/dL (ref 30.0–36.0)
MCV: 99.6 fL (ref 80.0–100.0)
Monocytes Absolute: 0.9 10*3/uL (ref 0.1–1.0)
Monocytes Relative: 9 %
Neutro Abs: 8.9 10*3/uL — ABNORMAL HIGH (ref 1.7–7.7)
Neutrophils Relative %: 83 %
Platelets: 178 10*3/uL (ref 150–400)
RBC: 4.75 MIL/uL (ref 4.22–5.81)
RDW: 13.7 % (ref 11.5–15.5)
WBC: 10.7 10*3/uL — ABNORMAL HIGH (ref 4.0–10.5)
nRBC: 0 % (ref 0.0–0.2)

## 2020-09-15 LAB — TROPONIN I (HIGH SENSITIVITY)
Troponin I (High Sensitivity): 4 ng/L (ref <18)
Troponin I (High Sensitivity): 5 ng/L (ref <18)

## 2020-09-15 LAB — BASIC METABOLIC PANEL
Anion gap: 7 (ref 5–15)
BUN: 22 mg/dL (ref 8–23)
CO2: 27 mmol/L (ref 22–32)
Calcium: 9.5 mg/dL (ref 8.9–10.3)
Chloride: 108 mmol/L (ref 98–111)
Creatinine, Ser: 0.96 mg/dL (ref 0.61–1.24)
GFR, Estimated: >60 mL/min (ref >60)
Glucose, Bld: 97 mg/dL (ref 70–99)
Potassium: 4.5 mmol/L (ref 3.5–5.1)
Sodium: 142 mmol/L (ref 135–145)

## 2020-09-15 NOTE — ED Notes (Signed)
Patient transported to CT 

## 2020-09-15 NOTE — Discharge Instructions (Signed)
Imaging did not show any new findings, labs are at baseline.  CT scan of your neck did show a possible parotid gland enlargement. They recommend he follow-up with primary care provider to have CT scan of the soft tissue of your neck for assessment of this.

## 2020-09-15 NOTE — ED Triage Notes (Signed)
Pt from home, attempting to get in car and allegedly slipped, fell hitting back ofhead on ice. States he does not remember this, possible LOC x 1 hour. On plavix for hx of a-fib.

## 2020-09-15 NOTE — ED Provider Notes (Signed)
[facility] EMERGENCY DEPARTMENT Provider Note   CSN: [id number] Arrival date & time: 09/15/20  1139     History Chief Complaint  Patient presents with  . Fall    Duane Reyes is a 70 y.o. male with past medical history significant for CAD, arthritis, hypertension, hyperlipidemia who presents for evaluation of possible LOC after fall.  Patient states was getting out of his car.  He is unsure if he slipped and fell on the ice however fell backwards and hit the posterior aspect of his head.  He is on clopidogrel.  States he went into the house.  His daughter called him as she witnessed this as she lives across the street.  States by the time she got over there patient asked for his recently deceased wife.  He is unsure if he had LOC.  He became tearful when family reminded him that she had passed.  States "I think I got knocked silly."  He denied any preceding symptoms prior to fall such as headache, lightness, dizziness, chest pain, shortness of breath.  He is unsure of his last tetanus.  Does have abrasions to bilateral palms of hands where denies any bony tenderness.  He is ambulatory after the fall.  States he has a headache which he rates a 2/10.  Has not anything for pain.  Denies lightheadedness, dizziness, vision changes, paresthesias, weakness, neck pain, chest pain, shortness of breath abdominal pain, diarrhea, dysuria, bony extremity pain, redness, swelling, warmth.  Denies additional aggravating relieving factors.  History obtained from patient and past medical records> no  interpreter used.  HPI     Past Medical History:  Diagnosis Date  . Arthritis    "left knee" (01/29/2016)  . 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 Bronchiolitis   . CAD in native artery    a. Abnl cardiac CT -> LHC 01/2016 s/p 50% mLAD, 25% OM2, 95% RPDA which was treated with overlapping DES.  [name] Chronic lower back pain   . Dyspnea   . Frequent PVCs    a. 12/2015 - Holter showed SB, NSR, ST, HR 54-108,  with frequent PVCs in singles, couplets, and bigemy, with elevated PVC load 24%.  [name] GERD (gastroesophageal reflux disease)   . History of shingles   . Hyperlipidemia   . Hypertension   . Hypothyroidism   . Prostate cancer ([place])   . Tortuosity of aortic arch, -would not pursue Rt radial approach in this patient for future cardiac caths     Patient Active Problem List   Diagnosis Date Noted  . Restrictive lung disease 04/19/2018  . 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 Bronchitis 12/13/2017  . Pre-operative clearance 12/13/2017  . 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 CAD in native artery 01/30/2016  . S/P angioplasty with stent 01/29/16 RPDA with 2 overlapping DES   01/30/2016  . Tortuosity of aortic arch, -would not pursue Rt radial approach in this patient for future cardiac caths 01/30/2016  . DOE (dyspnea on exertion)   . Coronary artery calcification seen on CT scan   . PVC (premature ventricular contraction) 01/12/2016  . HTN (hypertension) 04/30/2015  . Hyperlipidemia LDL goal <100 04/30/2015  . Spondylolisthesis of lumbar region L4-5 L5-S1 03/12/2013  . 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 BACK SURGERY    . CARDIAC CATHETERIZATION N/A 01/29/2016   Procedure: Right/Left Heart Cath and Coronary Angiography;  Surgeon: Jettie Booze, MD;  Location: [facility] INVASIVE CV LAB;  Service: Cardiovascular;  Laterality: N/A;  . CARDIAC CATHETERIZATION N/A 01/29/2016   Procedure: Coronary Stent Intervention 2.5/16mm Synergy-proximal PDA;  Surgeon: Corky Crafts, MD;  Location: [facility] INVASIVE CV LAB;  Service: Cardiovascular;  Laterality: N/A;  . CATARACT EXTRACTION W/ INTRAOCULAR LENS IMPLANT Left 11/2015  . COLONOSCOPY    . CORONARY ANGIOPLASTY WITH STENT PLACEMENT  01/29/2016   "2 stents"  . CYST EXCISION Right 2005   "near bicep"  . ESOPHAGOGASTRODUODENOSCOPY    . EYE SURGERY    . JOINT  REPLACEMENT    . KNEE ARTHROSCOPY Left 2012   "torn meniscus"  . POSTERIOR LUMBAR FUSION  02/2013   "L4-5; hardware in place"  . PROSTATE BIOPSY  2013  . PROSTATE SURGERY  2013   "brachytherapy for prostate radiation"  . TONSILLECTOMY  1950s  . TOTAL KNEE ARTHROPLASTY Left 12/18/2017  . TOTAL KNEE ARTHROPLASTY Left 12/18/2017   Procedure: TOTAL KNEE ARTHROPLASTY;  Surgeon: Dannielle Huh, MD;  Location: [facility] OR;  Service: Orthopedics;  Laterality: Left;       Family History  Problem Relation Age of Onset  . Heart attack Father   . Heart disease Father     Social History   Tobacco Use  . Smoking status: Never Smoker  . Smokeless tobacco: Never Used  Vaping Use  . Vaping Use: Never used  Substance Use Topics  . Alcohol use: Yes    Comment: 01/29/2016 "glass of wine q couple months"  . Drug use: No    Home Medications Prior to Admission medications   Medication Sig Start Date End Date Taking? Authorizing Provider  acetaminophen (TYLENOL) 500 MG tablet Take 500 mg by mouth every 6 (six) hours as needed.    [provider]  Albuterol Sulfate (PROAIR HFA IN) Inhale 2 puffs into the lungs every 6 (six) hours as needed (for wheezing or shortness of breath).     [provider]  atorvastatin (LIPITOR) 40 MG tablet Take 1 tablet (40 mg total) by mouth daily at 6 PM. 01/30/16   Leone Brand, NP  budesonide-formoterol [facility]) 80-4.5 MCG/ACT inhaler Inhale 2 puffs into the lungs 2 (two) times daily as needed (for flares).  12/18/17   [provider]  Cholecalciferol (VITAMIN D-3 PO) Take 1 capsule by mouth 2 (two) times a week.    [provider]  clopidogrel (PLAVIX) 75 MG tablet TAKE 1 TABLET DAILY WITH BREAKFAST 08/20/20   Camnitz, Andree Coss, MD  Coenzyme Q10 (CO Q-10) 120 MG CAPS Take 120 mg by mouth 3 (three) times daily.    [provider]  levothyroxine (SYNTHROID, LEVOTHROID) 125 MCG tablet Take 125 mcg by mouth daily before  breakfast.    [provider]  mexiletine (MEXITIL) 150 MG capsule Take 1 capsule (150 mg total) by mouth 2 (two) times daily. 07/14/20   Camnitz, Andree Coss, MD  Multiple Vitamins-Minerals (PRESERVISION AREDS 2 PO) Take 1 capsule by mouth 2 (two) times daily.    [provider]  nitroGLYCERIN (NITROSTAT) 0.4 MG SL tablet Place 1 tablet (0.4 mg total) under the tongue every 5 (five) minutes as needed for chest pain (x 3 doses). 05/14/18   Camnitz, Will Daphine Deutscher, MD  pantoprazole (PROTONIX) 40 MG tablet Take 1 tablet (40 mg total) by mouth daily. 08/21/19   Camnitz, Will Daphine Deutscher, MD  verapamil (CALAN-SR) 240 MG CR tablet TAKE 1 TABLET EVERY DAY (PATIENT NEEDS APPOINTMENT FOR FUTURE REFILLS THANK YOU) 07/27/20  Camnitz, Will Hassell Done, MD    Allergies    Topiramate, Celecoxib, Pitavastatin, Rosuvastatin calcium, and Hydrocodone-acetaminophen  Review of Systems   Review of Systems  Constitutional: Negative.   HENT: Negative.   Respiratory: Negative.   Cardiovascular: Negative.   Gastrointestinal: Negative.   Genitourinary: Negative.   Musculoskeletal: Negative.   Skin: Negative.   Neurological: Positive for headaches. Negative for dizziness, tremors, seizures, syncope, facial asymmetry, speech difficulty, weakness, light-headedness and numbness.  All other systems reviewed and are negative.   Physical Exam Updated Vital Signs BP 136/76   Pulse 84   Temp 98.3 F (36.8 C)   Resp 18   Ht 5\' 7"  (1.702 m)   Wt 79 kg   SpO2 98%   BMI 27.28 kg/m   Physical Exam Vitals and nursing note reviewed.  Constitutional:      General: He is not in acute distress.    Appearance: He is well-developed and well-nourished. He is not ill-appearing, toxic-appearing or diaphoretic.  HENT:     Head: Normocephalic.     Comments: Small, minimal hematoma to right posterior head.  No lacerations    Nose: Nose normal.     Mouth/Throat:     Mouth: Mucous membranes are moist.     Comments:  Tongue midline.  Smile equal and symmetric Eyes:     Extraocular Movements: Extraocular movements intact.     Conjunctiva/sclera: Conjunctivae normal.     Pupils: Pupils are equal, round, and reactive to light.     Comments: No horizontal, vertical or rotational nystagmus   Neck:     Trachea: Trachea and phonation normal.     Comments: Full active and passive ROM without pain No midline or paraspinal tenderness No nuchal rigidity or meningeal signs   Cardiovascular:     Rate and Rhythm: Normal rate and regular rhythm.     Pulses: Normal pulses.     Heart sounds: Normal heart sounds.  Pulmonary:     Effort: Pulmonary effort is normal. No respiratory distress.     Breath sounds: Normal breath sounds.     Comments: Clear to auscultation bilateral without wheeze, rhonchi or rales.  Speaks in full sentences without difficulty Abdominal:     General: Bowel sounds are normal. There is no distension.     Palpations: Abdomen is soft. There is no mass.     Tenderness: There is no abdominal tenderness. There is no right CVA tenderness, left CVA tenderness, guarding or rebound.     Hernia: No hernia is present.     Comments: Soft, nontender without rebound or guarding  Musculoskeletal:        General: Normal range of motion.     Cervical back: Full passive range of motion without pain, normal range of motion and neck supple.     Comments: Moves all 4 extremities at difficulty.  No bony tenderness.  Skin:    General: Skin is warm and dry.     Capillary Refill: Capillary refill takes less than 2 seconds.     Comments: Small abrasions to palms of bilateral hands.  No lacerations to suture.  Neurological:     General: No focal deficit present.     Mental Status: He is alert and oriented to person, place, and time.     Comments: Mental Status:  Alert, oriented, thought content appropriate. Speech fluent without evidence of aphasia. Able to follow 2 step commands without difficulty.  Cranial  Nerves:  II:  Peripheral visual fields grossly  normal, pupils equal, round, reactive to light III,IV, VI: ptosis not present, extra-ocular motions intact bilaterally  V,VII: smile symmetric, facial light touch sensation equal VIII: hearing grossly normal bilaterally  IX,X: midline uvula rise  XI: bilateral shoulder shrug equal and strong XII: midline tongue extension  Motor:  5/5 in upper and lower extremities bilaterally including strong and equal grip strength and dorsiflexion/plantar flexion Sensory: Pinprick and light touch normal in all extremities.  Deep Tendon Reflexes: 2+ and symmetric  Cerebellar: normal finger-to-nose with bilateral upper extremities Gait: normal gait and balance CV: distal pulses palpable throughout    Psychiatric:        Mood and Affect: Mood and affect normal.     ED Results / Procedures / Treatments   Labs (all labs ordered are listed, but only abnormal results are displayed) Labs Reviewed  CBC WITH DIFFERENTIAL/PLATELET - Abnormal; Notable for the following components:      Result Value   WBC 10.7 (*)    Neutro Abs 8.9 (*)    All other components within normal limits  BASIC METABOLIC PANEL  TROPONIN I (HIGH SENSITIVITY)  TROPONIN I (HIGH SENSITIVITY)    EKG None  Radiology DG Chest 2 View  Result Date: 09/15/2020 CLINICAL DATA:  Fall.  Syncope EXAM: CHEST - 2 VIEW COMPARISON:  07/17/2018 FINDINGS: Heart size and vascularity normal. Lungs are clear without infiltrate or effusion Severe dextroscoliosis in the lower thoracic spine unchanged. No acute skeletal abnormality. IMPRESSION: No active cardiopulmonary disease. Electronically Signed   By: Franchot Gallo M.D.   On: 09/15/2020 13:19   CT Head Wo Contrast  Result Date: 09/15/2020 CLINICAL DATA:  Headache, new or worsening, posttraumatic. Neck trauma. Additional history provided: Patient reports slip and fall on ice hitting back of head, possible loss of consciousness, on Plavix. EXAM: CT  HEAD WITHOUT CONTRAST CT CERVICAL SPINE WITHOUT CONTRAST TECHNIQUE: Multidetector CT imaging of the head and cervical spine was performed following the standard protocol without intravenous contrast. Multiplanar CT image reconstructions of the cervical spine were also generated. COMPARISON:  Head CT 06/12/2014.  Cervical spine MRI 01/15/2014. FINDINGS: CT HEAD FINDINGS Brain: Cerebral volume is normal for age. Redemonstrated chronic lacunar infarct within the left thalamus capsular junction. Background mild ill-defined hypoattenuation within the cerebral white matter is nonspecific, but compatible chronic small vessel ischemic disease. There is no acute intracranial hemorrhage. No demarcated cortical infarct. No extra-axial fluid collection. No evidence of intracranial mass. No midline shift. Vascular: No hyperdense vessel.  Atherosclerotic calcifications. Skull: Normal. Negative for fracture or focal lesion. Sinuses/Orbits: Visualized orbits show no acute finding. Mild bilateral ethmoid and right maxillary sinus mucosal thickening at the imaged levels. Mild mucosal thickening and small air-fluid level within the imaged left maxillary sinus. CT CERVICAL SPINE FINDINGS Alignment: Mild nonspecific reversal of the expected cervical lordosis. Cervical dextrocurvature. Trace C3-C4 grade 1 anterolisthesis. Skull base and vertebrae: The basion-dental and atlanto-dental intervals are maintained.No evidence of acute fracture to the cervical spine. Soft tissues and spinal canal: No prevertebral fluid or swelling. No visible canal hematoma. Disc levels: Cervical spondylosis with multilevel disc space narrowing, disc bulges, posterior disc osteophytes, uncovertebral hypertrophy and facet arthrosis. Disc space narrowing is severe at C5-C6 and C6-C7. Upper chest: No consolidation within the imaged lung apices. No visible pneumothorax. Other: 2.3 x 2.2 cm low-density lesion along the inferior aspect of the left parotid gland  (series 4, image 29). IMPRESSION: CT head: 1. No evidence of acute intracranial abnormality. 2. 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 Mild cerebral white matter chronic small vessel ischemic disease 3. Sinusitis as described, most notably left maxillary. CT cervical spine: 1. No evidence of acute fracture to the cervical spine. 2. Nonspecific reversal of the expected cervical lordosis. 3. Cervical dextrocurvature. 4. Trace C3-C4 grade 1 anterolisthesis. 5. Cervical spondylosis as described. 6. 2.3 x 2.2 cm low-density lesion along the inferior aspect of the left parotid gland, incompletely assessed on this non-contrast study. Primary differential considerations include a primary parotid neoplasm or an abnormal cystic/necrotic lymph node. A nonemergent contrast-enhanced neck CT is recommended for further evaluation. Electronically Signed   By: Kellie Simmering DO   On: 09/15/2020 13:25   CT Cervical Spine Wo Contrast  Result Date: 09/15/2020 CLINICAL DATA:  Headache, new or worsening, posttraumatic. Neck trauma. Additional history provided: Patient reports slip and fall on ice hitting back of head, possible loss of consciousness, on Plavix. EXAM: CT HEAD WITHOUT CONTRAST CT CERVICAL SPINE WITHOUT CONTRAST TECHNIQUE: Multidetector CT imaging of the head and cervical spine was performed following the standard protocol without intravenous contrast. Multiplanar CT image reconstructions of the cervical spine were also generated. COMPARISON:  Head CT 06/12/2014.  Cervical spine MRI 01/15/2014. FINDINGS: CT HEAD FINDINGS Brain: Cerebral volume is normal for age. Redemonstrated chronic lacunar infarct within the left thalamus capsular junction. Background mild ill-defined hypoattenuation within the cerebral white matter is nonspecific, but compatible chronic small vessel ischemic disease. There is no acute intracranial hemorrhage. No demarcated cortical infarct. No extra-axial fluid  collection. No evidence of intracranial mass. No midline shift. Vascular: No hyperdense vessel.  Atherosclerotic calcifications. Skull: Normal. Negative for fracture or focal lesion. Sinuses/Orbits: Visualized orbits show no acute finding. Mild bilateral ethmoid and right maxillary sinus mucosal thickening at the imaged levels. Mild mucosal thickening and small air-fluid level within the imaged left maxillary sinus. CT CERVICAL SPINE FINDINGS Alignment: Mild nonspecific reversal of the expected cervical lordosis. Cervical dextrocurvature. Trace C3-C4 grade 1 anterolisthesis. Skull base and vertebrae: The basion-dental and atlanto-dental intervals are maintained.No evidence of acute fracture to the cervical spine. Soft tissues and spinal canal: No prevertebral fluid or swelling. No visible canal hematoma. Disc levels: Cervical spondylosis with multilevel disc space narrowing, disc bulges, posterior disc osteophytes, uncovertebral hypertrophy and facet arthrosis. Disc space narrowing is severe at C5-C6 and C6-C7. Upper chest: No consolidation within the imaged lung apices. No visible pneumothorax. Other: 2.3 x 2.2 cm low-density lesion along the inferior aspect of the left parotid gland (series 4, image 29). IMPRESSION: CT head: 1. No evidence of acute intracranial abnormality. 2. Redemonstrated chronic left thalamocapsular junction lacunar infarct. Mild cerebral white matter chronic small vessel ischemic disease 3. Sinusitis as described, most notably left maxillary. CT cervical spine: 1. No evidence of acute fracture to the cervical spine. 2. Nonspecific reversal of the expected cervical lordosis. 3. Cervical dextrocurvature. 4. Trace C3-C4 grade 1 anterolisthesis. 5. Cervical spondylosis as described. 6. 2.3 x 2.2 cm low-density lesion along the inferior aspect of the left parotid gland, incompletely assessed on this non-contrast study. Primary differential considerations include a primary parotid neoplasm or an  abnormal cystic/necrotic lymph node. A nonemergent contrast-enhanced neck CT is recommended for further evaluation. Electronically Signed   By: Kellie Simmering DO   On: 09/15/2020 13:25   Procedures Procedures (including critical care time)  Medications Ordered in ED Medications - No data to display  ED Course  I have reviewed the triage vital signs and the nursing notes.  Pertinent labs & imaging results that  were available during my care of the patient were reviewed by me and considered in my medical decision making (see chart for details).  70 year old presents for evaluation of fall. He has a nonfocal neuro exam without deficits. No preceding symptoms. Ambulatory in ED without difficulty. Plan on labs imaging given possible confusion post fall which has resolved PTA.  Labs and imaging personally reviewed and interpreted:  CBC leukocytosis, no infectious symptoms CMP without electrolyte renal or liver abnormality Trop 4<<5 CT head without acute findings CT cervical without changes.  Possible parotid mass recommend outpatient follow-up with CT soft tissue.  Patient reassessed. Tolerating PO intake without difficulty. Continued non focal neuro exam without deficits. Possible concussion? After fall. No head bleed on exam. Discussed labs and imaging. Discussed incidental finding of possible parotid mass. He will follow-up with PCP for this. Discussed strict return precautions.  The patient has been appropriately medically screened and/or stabilized in the ED. I have low suspicion for any other emergent medical condition which would require further screening, evaluation or treatment in the ED or require inpatient management.  Patient is hemodynamically stable and in no acute distress.  Patient able to ambulate in department prior to ED.  Evaluation does not show acute pathology that would require ongoing or additional emergent interventions while in the emergency department or further inpatient  treatment.  I have discussed the diagnosis with the patient and answered all questions.  Pain is been managed while in the emergency department and patient has no further complaints prior to discharge.  Patient is comfortable with plan discussed in room and is stable for discharge at this time.  I have discussed strict return precautions for returning to the emergency department.  Patient was encouraged to follow-up with PCP/specialist refer to at discharge.    MDM Rules/Calculators/A&P                           Final Clinical Impression(s) / ED Diagnoses Final diagnoses:  Fall, initial encounter  Mass of parotid gland    Rx / DC Orders ED Discharge Orders    None       Isak Sotomayor A, PA-C 09/15/20 1810    Davonna Belling, MD 09/16/20 [phone]

## 2020-09-17 ENCOUNTER — Other Ambulatory Visit: Payer: Self-pay

## 2020-09-17 ENCOUNTER — Encounter (INDEPENDENT_AMBULATORY_CARE_PROVIDER_SITE_OTHER): Payer: Medicare HMO | Admitting: Ophthalmology

## 2020-09-17 DIAGNOSIS — H35373 Puckering of macula, bilateral: Secondary | ICD-10-CM

## 2020-09-17 DIAGNOSIS — H59032 Cystoid macular edema following cataract surgery, left eye: Secondary | ICD-10-CM

## 2020-09-17 DIAGNOSIS — I10 Essential (primary) hypertension: Secondary | ICD-10-CM | POA: Diagnosis not present

## 2020-09-17 DIAGNOSIS — H35033 Hypertensive retinopathy, bilateral: Secondary | ICD-10-CM | POA: Diagnosis not present

## 2020-09-17 DIAGNOSIS — H43813 Vitreous degeneration, bilateral: Secondary | ICD-10-CM

## 2020-09-17 DIAGNOSIS — H353111 Nonexudative age-related macular degeneration, right eye, early dry stage: Secondary | ICD-10-CM

## 2020-09-17 DIAGNOSIS — H353122 Nonexudative age-related macular degeneration, left eye, intermediate dry stage: Secondary | ICD-10-CM

## 2020-10-20 ENCOUNTER — Encounter: Payer: Self-pay | Admitting: Gastroenterology

## 2020-10-20 ENCOUNTER — Ambulatory Visit: Payer: Medicare HMO | Admitting: Gastroenterology

## 2020-10-20 ENCOUNTER — Other Ambulatory Visit: Payer: Self-pay

## 2020-10-20 VITALS — BP 128/82 | HR 76 | Ht 67.0 in | Wt 169.0 lb

## 2020-10-20 DIAGNOSIS — K5909 Other constipation: Secondary | ICD-10-CM | POA: Diagnosis not present

## 2020-10-20 DIAGNOSIS — R1032 Left lower quadrant pain: Secondary | ICD-10-CM | POA: Diagnosis not present

## 2020-10-20 NOTE — Patient Instructions (Signed)
If you are age 71 or older, your body mass index should be between 23-30. Your Body mass index is 26.47 kg/m. If this is out of the aforementioned range listed, please consider follow up with your Primary Care Provider.  If you are age 6 or younger, your body mass index should be between 19-25. Your Body mass index is 26.47 kg/m. If this is out of the aformentioned range listed, please consider follow up with your Primary Care Provider.   We will request your records of your recent CT of your Abdomen.  We will request records of your Colonoscopy from last year.   It was a pleasure to see you today!  Dr. Loletha Carrow

## 2020-10-20 NOTE — Progress Notes (Signed)
[facility] Consult Note:  History: Duane Reyes 10/20/2020  Referring provider: [name], MD  Reason for consult/chief complaint: Abdominal Pain (Lower left pain that itches and radiates to the back, x 8 months, more intense recently) and Constipation (3-4 days between movements)   Subjective  HPI:  This is a very pleasant 70 year old man referred by primary care for abdominal/back pain and constipation. He has had chronic mid to lower back pain for many years, he is known to have severe scoliosis and has had a surgery for that.  He started having more back pain last year that would begin in the mid back and radiate around to the left lower quadrant of the abdomen.  It has a burning and itching quality at times, it is worse with certain movements and he gets some temporary relief if he partially leans over with hands against the table.  This seems to relieve some pressure on the back.  He has had 2 steroid injections with his neurosurgeon, the first he thinks helped somewhat but the second not at all. He is also bothered by several months of constipation where he may go a few days between bowel movements.  Primary care recommended MiraLAX, and he was usually taking 2 teaspoons every day with some improvement, but stopped doing it regularly about 2 weeks ago.  He denies rectal bleeding. Duane Reyes says he had a routine screening colonoscopy with a practice in [place] last year, and he recalls that perhaps there were polyps and diverticulosis. He is on thyroid replacement, thinks he is probably due for a primary care clinic follow-up and some lab work, and has had no other new medicines in many months to contribute to constipation. Duane Reyes denies heartburn, dysphagia, odynophagia, early satiety or weight loss.  ROS:  Review of Systems  Constitutional: Negative for appetite change and unexpected weight change.  HENT: Negative for mouth sores and voice change.    Eyes: Negative for pain and redness.  Respiratory: Positive for shortness of breath. Negative for cough.   Cardiovascular: Negative for chest pain and palpitations.  Genitourinary: Negative for dysuria and hematuria.  Musculoskeletal: Positive for arthralgias and back pain. Negative for myalgias.  Skin: Negative for pallor and rash.  Neurological: Negative for weakness and headaches.       Balance difficulty at times  Hematological: Negative for adenopathy.     Past Medical History: Past Medical History:  Diagnosis Date  . Arthritis    "left knee" (01/29/2016)  . 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 Bronchiolitis   . CAD in native artery    a. Abnl cardiac CT -> LHC 01/2016 s/p 50% mLAD, 25% OM2, 95% RPDA which was treated with overlapping DES.  [name] Chronic knee pain   . Chronic lower back pain   . DOE (dyspnea on exertion)   . Dyspnea   . Frequent PVCs    a. 12/2015 - Holter showed SB, NSR, ST, HR 54-108, with frequent PVCs in singles, couplets, and bigemy, with elevated PVC load 24%.  [name] GERD (gastroesophageal reflux disease)   . History of shingles   . Hyperlipidemia   . Hypertension   . Hypothyroidism   . Prostate cancer ([place])   . Scoliosis   . Spinal stenosis   . Spondylolisthesis of lumbar region   . Tortuosity of aortic arch   . 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 Lake Bells in the past for this.  Past Surgical History: Past Surgical History:  Procedure Laterality Date  . ANGIOPLASTY    . BACK SURGERY    . CARDIAC CATHETERIZATION N/A 01/29/2016   Procedure: Right/Left Heart Cath and Coronary Angiography;  Surgeon: Jettie Booze, MD;  Location: [facility];  Service: Cardiovascular;  Laterality: N/A;  . CARDIAC CATHETERIZATION N/A 01/29/2016   Procedure:  Coronary Stent Intervention 2.5/16mm Synergy-proximal PDA;  Surgeon: Jettie Booze, MD;  Location: [facility];  Service: Cardiovascular;  Laterality: N/A;  . CATARACT EXTRACTION W/ INTRAOCULAR LENS IMPLANT Left 11/2015  . COLONOSCOPY    . CORONARY ANGIOPLASTY WITH STENT PLACEMENT  01/29/2016   "2 stents"  . CYST EXCISION Right 2005   "near bicep"  . ESOPHAGOGASTRODUODENOSCOPY    . EYE SURGERY    . INGUINAL HERNIA REPAIR    . JOINT REPLACEMENT    . KNEE ARTHROSCOPY Left 2012   "torn meniscus"  . POSTERIOR LUMBAR FUSION  02/2013   "L4-5; hardware in place"  . PROSTATE BIOPSY  2013  . PROSTATE SURGERY  2013   "brachytherapy for prostate radiation"  . TONSILLECTOMY  1950s  . TOTAL KNEE ARTHROPLASTY Left 12/18/2017  . TOTAL KNEE ARTHROPLASTY Left 12/18/2017   Procedure: TOTAL KNEE ARTHROPLASTY;  Surgeon: Vickey Huger, MD;  Location: [facility];  Service: Orthopedics;  Laterality: Left;     Family History: Family History  Problem Relation Age of Onset  . Heart attack Father   . Heart disease Father     Social History: Social History   Socioeconomic History  . Marital status: Married    Spouse name: Not on file  . Number of children: Not on file  . Years of education: Not on file  . Highest education level: Not on file  Occupational History  . Not on file  Tobacco Use  . Smoking status: Never Smoker  . Smokeless tobacco: Never Used  Vaping Use  . Vaping Use: Never used  Substance and Sexual Activity  . Alcohol use: Yes    Comment: 01/29/2016 "glass of wine q couple months"  . Drug use: No  . Sexual activity: Not Currently  Other Topics Concern  . Not on file  Social History Narrative  . Not on file   Social Determinants of Health   Financial Resource Strain: Not on file  Food Insecurity: Not on file  Transportation Needs: Not on file  Physical Activity: Not on file  Stress: Not on file  Social Connections: Not on file    Allergies: Allergies  Allergen  Reactions  . Topiramate Anxiety, Palpitations and Other (See Comments)    blurred vision, tired, ringing in ears, restlessness, confusion, rapid heart rate, made him jittery  . Celecoxib Other (See Comments)    GI Upset; did not mix with his Nexium/Caused acid relux    . Pitavastatin Rash and Cough    LIVALO   . Rosuvastatin Calcium Other (See Comments)    Muscle aches   . Hydrocodone-Acetaminophen Other (See Comments)    Delusions     Outpatient Meds: Current Outpatient Medications  Medication Sig Dispense Refill  . acetaminophen (TYLENOL) 500 MG tablet Take 500 mg by mouth every 6 (six) hours as needed.    . Albuterol Sulfate (PROAIR HFA IN) Inhale 2 puffs into the lungs every 6 (six) hours as needed (for wheezing or  shortness of breath).     [name] atorvastatin (LIPITOR) 40 MG tablet Take 1 tablet (40 mg total) by mouth daily at 6 PM. 30 tablet 6  . budesonide-formoterol (SYMBICORT) 80-4.5 MCG/ACT inhaler Inhale 2 puffs into the lungs 2 (two) times daily as needed (for flares).     . Cholecalciferol (VITAMIN D-3 PO) Take 1 capsule by mouth 2 (two) times a week.    . clopidogrel (PLAVIX) 75 MG tablet TAKE 1 TABLET DAILY WITH BREAKFAST 90 tablet 3  . Coenzyme Q10 (CO Q-10) 120 MG CAPS Take 120 mg by mouth 3 (three) times daily.    [name] levothyroxine (SYNTHROID, LEVOTHROID) 125 MCG tablet Take 125 mcg by mouth daily before breakfast.    . mexiletine (MEXITIL) 150 MG capsule Take 1 capsule (150 mg total) by mouth 2 (two) times daily. 180 capsule 3  . Multiple Vitamins-Minerals (PRESERVISION AREDS 2 PO) Take 1 capsule by mouth 2 (two) times daily.    . nitroGLYCERIN (NITROSTAT) 0.4 MG SL tablet Place 1 tablet (0.4 mg total) under the tongue every 5 (five) minutes as needed for chest pain (x 3 doses). 25 tablet 5  . pantoprazole (PROTONIX) 40 MG tablet Take 1 tablet (40 mg total) by mouth daily. 90 tablet 3  . verapamil (CALAN-SR) 240 MG CR tablet TAKE 1 TABLET EVERY DAY (PATIENT NEEDS  APPOINTMENT FOR FUTURE REFILLS THANK YOU) 90 tablet 2   No current facility-administered medications for this visit.      ___________________________________________________________________ Objective   Exam:  BP 128/82   Pulse 76   Ht 5\' 7"  (1.702 m)   Wt 169 lb (76.7 kg)   BMI 26.47 kg/m  Wt Readings from Last 3 Encounters:  10/20/20 169 lb (76.7 kg)  09/15/20 174 lb 2.6 oz (79 kg)  06/02/20 176 lb (79.8 kg)   Standing behind him while he is upright, it is clear he has severe scoliosis with differential shoulder height and protrusion of right scapula.  General: Well-appearing, conversational and pleasant.  Antalgic gait.  Gets on exam table slowly but without assistance.  Eyes: sclera anicteric, no redness  ENT: oral mucosa moist without lesions, no cervical or supraclavicular lymphadenopathy  CV: RRR without murmur, S1/S2, no JVD, no peripheral edema  Resp: clear to auscultation bilaterally, normal RR and effort noted  GI: soft, no tenderness, with active bowel sounds. No guarding or palpable organomegaly noted.  Skin; warm and dry, no rash or jaundice noted  Neuro: awake, alert and oriented x 3. Normal gross motor function and fluent speech  Labs: CT abdomen and pelvis dated 09/01/2020 with IV contrast (Report found in care everywhere after patient's office visit today)  8 mm cyst or hemangioma right hepatic lobe Mild fecal retention through:.  Colonic diverticulosis. Bilateral inguinal hernias, left containing fat in the right containing short segment small bowel not incarcerated or obstructed. Fiducial markers in the prostate, no pelvic mass Status post PLIF from L4-S1 with scoliosis  Assessment: Encounter Diagnoses  Name Primary?  [name] LLQ abdominal pain Yes  . Chronic constipation     This is back pain radiating in a radicular fashion to his left lower quadrant.  It has all the hallmarks with burning and itching quality, worse with certain movements and  partially relieved with certain position.  He is known to have severe scoliosis that I believe is the cause. His constipation in recent months is most likely unrelated to that back pain.  He recalls having had a back MRI, and the  CT abdomen from earlier this year was noted above.  Only one of his medicines may be contributing constipation as the verapamil, but he does not seem to recall that this is a new medicine for him.  Colonoscopy within the last year is reassuring. Plan:  I recommended he take the MiraLAX daily for best results.  1 capful in a glass of water, which can be dose adjusted as needed to have a BM daily. We have also made a request for his most recent colonoscopy report. He is due to see primary care soon and should have TSH checked.  I recommended that he revisit the back pain with his neurosurgeon to see if he might benefit from physical therapy, pain control or both.  I would avoid chronic opioids in this elderly man with constipation.  Duane Reyes is welcome to come see me as needed.  Thank you for the courtesy of this consult.  Please call me with any questions or concerns.  Nelida Meuse III  CC: Referring provider noted above

## 2021-03-08 ENCOUNTER — Other Ambulatory Visit: Payer: Self-pay | Admitting: Cardiology

## 2021-03-17 ENCOUNTER — Other Ambulatory Visit: Payer: Self-pay

## 2021-03-17 ENCOUNTER — Encounter (INDEPENDENT_AMBULATORY_CARE_PROVIDER_SITE_OTHER): Payer: Medicare HMO | Admitting: Ophthalmology

## 2021-03-17 DIAGNOSIS — H35373 Puckering of macula, bilateral: Secondary | ICD-10-CM | POA: Diagnosis not present

## 2021-03-17 DIAGNOSIS — H59032 Cystoid macular edema following cataract surgery, left eye: Secondary | ICD-10-CM | POA: Diagnosis not present

## 2021-03-17 DIAGNOSIS — H43813 Vitreous degeneration, bilateral: Secondary | ICD-10-CM

## 2021-03-17 DIAGNOSIS — H353132 Nonexudative age-related macular degeneration, bilateral, intermediate dry stage: Secondary | ICD-10-CM | POA: Diagnosis not present

## 2021-03-17 DIAGNOSIS — I10 Essential (primary) hypertension: Secondary | ICD-10-CM | POA: Diagnosis not present

## 2021-03-17 DIAGNOSIS — H35033 Hypertensive retinopathy, bilateral: Secondary | ICD-10-CM

## 2021-03-22 NOTE — Progress Notes (Signed)
PCP:  [name], MD Primary Cardiologist: Fransico Him, MD Electrophysiologist: Constance Haw, MD   Duane Reyes is a 70 y.o. male seen today for [name], MD for routine electrophysiology followup.  Since last being seen in our clinic the patient reports doing OK. He has noticed more fatigue and DOE gradually over the past year. He is able to push through for the most part. He feels like he gets tired too soon, but not that he has to stop. Denies chest pain, dizziness,or .lightheadedness. He did have a recent episode of bad reflux. Relieved now with pepcid.   Past Medical History:  Diagnosis Date   Arthritis    "left knee" (01/29/2016)   Bradycardia, drug induced 10/26/2016   Bronchiolitis    CAD in native artery    a. Abnl cardiac CT -> LHC 01/2016 s/p 50% mLAD, 25% OM2, 95% RPDA which was treated with overlapping DES.   Chronic knee pain    Chronic lower back pain    DOE (dyspnea on exertion)    Dyspnea    Frequent PVCs    a. 12/2015 - Holter showed SB, NSR, ST, HR 54-108, with frequent PVCs in singles, couplets, and bigemy, with elevated PVC load 24%.   GERD (gastroesophageal reflux disease)    History of shingles    Hyperlipidemia    Hypertension    Hypothyroidism    Prostate cancer ([place])    Scoliosis    Spinal stenosis    Spondylolisthesis of lumbar region    Tortuosity of aortic arch    Tortuosity of aortic arch, -would not pursue Rt radial approach in this patient for future cardiac caths    Past Surgical History:  Procedure Laterality Date   ANGIOPLASTY     BACK SURGERY     CARDIAC CATHETERIZATION N/A 01/29/2016   Procedure: Right/Left Heart Cath and Coronary Angiography;  Surgeon: Jettie Booze, MD;  Location: [facility];  Service: Cardiovascular;  Laterality: N/A;   CARDIAC CATHETERIZATION N/A 01/29/2016   Procedure: Coronary Stent Intervention 2.5/16mm Synergy-proximal PDA;  Surgeon: Jettie Booze, MD;  Location: [facility];  Service: Cardiovascular;  Laterality: N/A;   CATARACT EXTRACTION W/ INTRAOCULAR LENS IMPLANT Left 11/2015   COLONOSCOPY     CORONARY ANGIOPLASTY WITH STENT PLACEMENT  01/29/2016   "2 stents"   CYST EXCISION Right 2005   "near bicep"   ESOPHAGOGASTRODUODENOSCOPY     EYE SURGERY     INGUINAL HERNIA REPAIR     JOINT REPLACEMENT     KNEE ARTHROSCOPY Left 2012   "torn meniscus"   POSTERIOR LUMBAR FUSION  02/2013   "L4-5; hardware in place"   PROSTATE BIOPSY  2013   PROSTATE SURGERY  2013   "brachytherapy for prostate radiation"   TONSILLECTOMY  1950s   TOTAL KNEE ARTHROPLASTY Left 12/18/2017   TOTAL KNEE ARTHROPLASTY Left 12/18/2017   Procedure: TOTAL KNEE ARTHROPLASTY;  Surgeon: Vickey Huger, MD;  Location: [facility];  Service: Orthopedics;  Laterality: Left;    Current Outpatient Medications  Medication Sig Dispense Refill   acetaminophen (TYLENOL) 500 MG tablet Take 500 mg by mouth every 6 (six) hours as needed.     Albuterol Sulfate (PROAIR HFA IN) Inhale 2 puffs into the lungs every 6 (six) hours as needed (for wheezing or shortness of breath).      atorvastatin (LIPITOR) 40 MG tablet Take 1 tablet (40 mg total) by mouth daily at 6 PM. 30 tablet  6   budesonide-formoterol (SYMBICORT) 80-4.5 MCG/ACT inhaler Inhale 2 puffs into the lungs 2 (two) times daily as needed (for flares).      Cholecalciferol (VITAMIN D-3 PO) Take 1 capsule by mouth 2 (two) times a week.     clopidogrel (PLAVIX) 75 MG tablet TAKE 1 TABLET DAILY WITH BREAKFAST 90 tablet 3   Coenzyme Q10 (CO Q-10) 120 MG CAPS Take 120 mg by mouth 3 (three) times daily.     levothyroxine (SYNTHROID, LEVOTHROID) 125 MCG tablet Take 125 mcg by mouth daily before breakfast.     mexiletine (MEXITIL) 150 MG capsule Take 1 capsule (150 mg total) by mouth 2 (two) times daily. 180 capsule 3   Multiple Vitamins-Minerals (PRESERVISION AREDS 2 PO) Take 1 capsule by mouth 2 (two) times daily.     nitroGLYCERIN (NITROSTAT) 0.4 MG  SL tablet Place 1 tablet (0.4 mg total) under the tongue every 5 (five) minutes as needed for chest pain (x 3 doses). 25 tablet 5   pantoprazole (PROTONIX) 40 MG tablet Take 1 tablet (40 mg total) by mouth daily. 90 tablet 3   prednisoLONE acetate (PREDNISOLONE ACETATE P-F) 1 % ophthalmic suspension Place 1 drop into the left eye 4 (four) times daily.     verapamil (CALAN-SR) 240 MG CR tablet TAKE 1 TABLET EVERY DAY (PATIENT NEEDS APPOINTMENT FOR FUTURE REFILLS THANK YOU) 90 tablet 0   No current facility-administered medications for this visit.    Allergies  Allergen Reactions   Topiramate Anxiety, Palpitations and Other (See Comments)    blurred vision, tired, ringing in ears, restlessness, confusion, rapid heart rate, made him jittery   Celecoxib Other (See Comments)    GI Upset; did not mix with his Nexium/Caused acid relux     Pitavastatin Rash and Cough    LIVALO    Rosuvastatin Calcium Other (See Comments)    Muscle aches    Hydrocodone-Acetaminophen Other (See Comments)    Delusions     Social History   Socioeconomic History   Marital status: Married    Spouse name: Not on file   Number of children: Not on file   Years of education: Not on file   Highest education level: Not on file  Occupational History   Not on file  Tobacco Use   Smoking status: Never   Smokeless tobacco: Never  Vaping Use   Vaping Use: Never used  Substance and Sexual Activity   Alcohol use: Yes    Comment: 01/29/2016 "glass of wine q couple months"   Drug use: No   Sexual activity: Not Currently  Other Topics Concern   Not on file  Social History Narrative   Not on file   Social Determinants of Health   Financial Resource Strain: Not on file  Food Insecurity: Not on file  Transportation Needs: Not on file  Physical Activity: Not on file  Stress: Not on file  Social Connections: Not on file  Intimate Partner Violence: Not on file     Review of Systems: General: No chills,  fever, night sweats or weight changes  Cardiovascular:  No chest pain, dyspnea on exertion, edema, orthopnea, palpitations, paroxysmal nocturnal dyspnea Dermatological: No rash, lesions or masses Respiratory: No cough, dyspnea Urologic: No hematuria, dysuria Abdominal: No nausea, vomiting, diarrhea, bright red blood per rectum, melena, or hematemesis Neurologic: No visual changes, weakness, changes in mental status All other systems reviewed and are otherwise negative except as noted above.  Physical Exam: Vitals:   03/23/21 0810  BP: 114/66  Pulse: 76  SpO2: 97%  Weight: 177 lb 12.8 oz (80.6 kg)  Height: 5' 7.5" (1.715 m)    GEN- The patient is well appearing, alert and oriented x 3 today.   HEENT: normocephalic, atraumatic; sclera clear, conjunctiva pink; hearing intact; oropharynx clear; neck supple, no JVP Lymph- no cervical lymphadenopathy Lungs- Clear to ausculation bilaterally, normal work of breathing.  No wheezes, rales, rhonchi Heart- Mildly irregular in setting of ectopy rate and rhythm, no murmurs, rubs or gallops, PMI not laterally displaced GI- soft, non-tender, non-distended, bowel sounds present, no hepatosplenomegaly Extremities- no clubbing, cyanosis, or edema; DP/PT/radial pulses 2+ bilaterally MS- no significant deformity or atrophy Skin- warm and dry, no rash or lesion Psych- euthymic mood, full affect Neuro- strength and sensation are intact  EKG is not ordered. Personal review of EKG from  09/15/20  shows NSR at 78 bpm  Additional studies reviewed include: Previous EP office notes  Labs from [facility] earlier this month reviewed in Care-Everywhere K 4.2, Cr 0.97, TSH 4.5  Assessment and Plan:  1. CAD PCI in 2017 Denies anginal symptoms.  Continue on clopidogrel, statin, CCB (intolerant of BB)   2. PVCs Stable on mexiletine Asymptomatic. PVC burden 3.6% in 05/2020. No reason to change therapy at this time. On exam he is having PVCs every 4-5 beats  today. Will start with updating echo as below.    3. HTN Stable on current regimen.    4. HLD Monitored and managed per his PCP.  5. DOE  Mixed history. Able to continue his ADLs and mild to moderate exercise.  With history of CAD will update echo. If abnormal will need to update ischemic testing, at this point low suspicion.   Shirley Friar, PA-C  03/23/21 8:28 AM

## 2021-03-23 ENCOUNTER — Other Ambulatory Visit: Payer: Self-pay

## 2021-03-23 ENCOUNTER — Ambulatory Visit: Payer: Medicare HMO | Admitting: Student

## 2021-03-23 ENCOUNTER — Encounter: Payer: Self-pay | Admitting: Student

## 2021-03-23 VITALS — BP 114/66 | HR 76 | Ht 67.5 in | Wt 177.8 lb

## 2021-03-23 DIAGNOSIS — R0609 Other forms of dyspnea: Secondary | ICD-10-CM

## 2021-03-23 DIAGNOSIS — I2510 Atherosclerotic heart disease of native coronary artery without angina pectoris: Secondary | ICD-10-CM | POA: Diagnosis not present

## 2021-03-23 DIAGNOSIS — E785 Hyperlipidemia, unspecified: Secondary | ICD-10-CM

## 2021-03-23 DIAGNOSIS — R0600 Dyspnea, unspecified: Secondary | ICD-10-CM

## 2021-03-23 DIAGNOSIS — I493 Ventricular premature depolarization: Secondary | ICD-10-CM

## 2021-03-23 DIAGNOSIS — I10 Essential (primary) hypertension: Secondary | ICD-10-CM

## 2021-03-23 NOTE — Patient Instructions (Signed)
Medication Instructions:  Your physician recommends that you continue on your current medications as directed. Please refer to the Current Medication list given to you today.  *If you need a refill on your cardiac medications before your next appointment, please call your pharmacy*   Lab Work: None If you have labs (blood work) drawn today and your tests are completely normal, you will receive your results only by: [place] (if you have MyChart) OR A paper copy in the mail If you have any lab test that is abnormal or we need to change your treatment, we will call you to review the results.   Testing/Procedures: Your physician has requested that you have an echocardiogram. Echocardiography is a painless test that uses sound waves to create images of your heart. It provides your doctor with information about the size and shape of your heart and how well your heart's chambers and valves are working. This procedure takes approximately one hour. There are no restrictions for this procedure.   Follow-Up: At [facility], you and your health needs are our priority.  As part of our continuing mission to provide you with exceptional heart care, we have created designated Provider Care Teams.  These Care Teams include your primary Cardiologist (physician) and Advanced Practice Providers (APPs -  Physician Assistants and Nurse Practitioners) who all work together to provide you with the care you need, when you need it.   Your next appointment:   As scheduled

## 2021-04-08 ENCOUNTER — Other Ambulatory Visit: Payer: Self-pay

## 2021-04-08 ENCOUNTER — Ambulatory Visit (HOSPITAL_COMMUNITY): Payer: Medicare HMO | Attending: Cardiology

## 2021-04-08 DIAGNOSIS — R0600 Dyspnea, unspecified: Secondary | ICD-10-CM

## 2021-04-08 DIAGNOSIS — R0609 Other forms of dyspnea: Secondary | ICD-10-CM

## 2021-04-08 DIAGNOSIS — I493 Ventricular premature depolarization: Secondary | ICD-10-CM

## 2021-04-08 LAB — ECHOCARDIOGRAM COMPLETE
Area-P 1/2: 2.87 cm2
S' Lateral: 3.4 cm

## 2021-04-08 MED ORDER — METOPROLOL SUCCINATE ER 50 MG PO TB24: 50.0000 mg | ORAL_TABLET | Freq: Every day | ORAL | 3 refills | Status: DC

## 2021-04-09 ENCOUNTER — Other Ambulatory Visit: Payer: Self-pay | Admitting: Student

## 2021-04-09 ENCOUNTER — Telehealth: Payer: Self-pay | Admitting: Student

## 2021-04-09 DIAGNOSIS — R0609 Other forms of dyspnea: Secondary | ICD-10-CM

## 2021-04-09 DIAGNOSIS — R0600 Dyspnea, unspecified: Secondary | ICD-10-CM

## 2021-04-09 NOTE — Telephone Encounter (Signed)
Error

## 2021-04-12 ENCOUNTER — Telehealth (HOSPITAL_COMMUNITY): Payer: Self-pay | Admitting: *Deleted

## 2021-04-12 NOTE — Telephone Encounter (Signed)
Patient given detailed instructions per Myocardial Perfusion Study Information Sheet for the test on 04/14/21 Patient notified to arrive 15 minutes early and that it is imperative to arrive on time for appointment to keep from having the test rescheduled.  If you need to cancel or reschedule your appointment, please call the office within 24 hours of your appointment. . Patient verbalized understanding. Kirstie Peri, RN

## 2021-04-14 ENCOUNTER — Ambulatory Visit (HOSPITAL_COMMUNITY): Payer: Medicare HMO | Attending: Cardiovascular Disease

## 2021-04-14 ENCOUNTER — Other Ambulatory Visit: Payer: Self-pay

## 2021-04-14 DIAGNOSIS — R0600 Dyspnea, unspecified: Secondary | ICD-10-CM | POA: Diagnosis not present

## 2021-04-14 DIAGNOSIS — R0609 Other forms of dyspnea: Secondary | ICD-10-CM

## 2021-04-14 LAB — MYOCARDIAL PERFUSION IMAGING
LV dias vol: 95 mL (ref 62–150)
LV sys vol: 47 mL
Peak HR: 70 {beats}/min
Rest HR: 60 {beats}/min
SDS: 1
SRS: 0
SSS: 1
TID: 0.97

## 2021-04-14 MED ORDER — TECHNETIUM TC 99M TETROFOSMIN IV KIT
32.2000 | PACK | Freq: Once | INTRAVENOUS | Status: AC | PRN
  Administered 2021-04-14: 32.2 via INTRAVENOUS
  Filled 2021-04-14: qty 33

## 2021-04-14 MED ORDER — TECHNETIUM TC 99M TETROFOSMIN IV KIT
11.0000 | PACK | Freq: Once | INTRAVENOUS | Status: AC | PRN
  Administered 2021-04-14: 11 via INTRAVENOUS
  Filled 2021-04-14: qty 11

## 2021-04-14 MED ORDER — REGADENOSON 0.4 MG/5ML IV SOLN
0.4000 mg | Freq: Once | INTRAVENOUS | Status: AC
Start: 2021-04-14 — End: 2021-04-14
  Administered 2021-04-14: 0.4 mg via INTRAVENOUS

## 2021-04-22 NOTE — Progress Notes (Signed)
PCP:  [name], MD Primary Cardiologist: Fransico Him, MD Electrophysiologist: Constance Haw, MD   Duane Reyes is a 70 y.o. male seen today for [name], MD for routine electrophysiology followup.  Since last being seen in our clinic the patient reports doing very well.  he denies chest pain, palpitations, PND, orthopnea, nausea, vomiting, dizziness, syncope, edema, weight gain, or early satiety. He remains SOB with moderate exertion. Has known restrictive lung disease. Hasn't seen pulm since 2019.  Past Medical History:  Diagnosis Date   Arthritis    "left knee" (01/29/2016)   Bradycardia, drug induced 10/26/2016   Bronchiolitis    CAD in native artery    a. Abnl cardiac CT -> LHC 01/2016 s/p 50% mLAD, 25% OM2, 95% RPDA which was treated with overlapping DES.   Chronic knee pain    Chronic lower back pain    DOE (dyspnea on exertion)    Dyspnea    Frequent PVCs    a. 12/2015 - Holter showed SB, NSR, ST, HR 54-108, with frequent PVCs in singles, couplets, and bigemy, with elevated PVC load 24%.   GERD (gastroesophageal reflux disease)    History of shingles    Hyperlipidemia    Hypertension    Hypothyroidism    Prostate cancer ([place])    Scoliosis    Spinal stenosis    Spondylolisthesis of lumbar region    Tortuosity of aortic arch    Tortuosity of aortic arch, -would not pursue Rt radial approach in this patient for future cardiac caths    Past Surgical History:  Procedure Laterality Date   ANGIOPLASTY     BACK SURGERY     CARDIAC CATHETERIZATION N/A 01/29/2016   Procedure: Right/Left Heart Cath and Coronary Angiography;  Surgeon: Jettie Booze, MD;  Location: [facility];  Service: Cardiovascular;  Laterality: N/A;   CARDIAC CATHETERIZATION N/A 01/29/2016   Procedure: Coronary Stent Intervention 2.5/16mm Synergy-proximal PDA;  Surgeon: Jettie Booze, MD;  Location: [facility];  Service: Cardiovascular;  Laterality: N/A;    CATARACT EXTRACTION W/ INTRAOCULAR LENS IMPLANT Left 11/2015   COLONOSCOPY     CORONARY ANGIOPLASTY WITH STENT PLACEMENT  01/29/2016   "2 stents"   CYST EXCISION Right 2005   "near bicep"   ESOPHAGOGASTRODUODENOSCOPY     EYE SURGERY     INGUINAL HERNIA REPAIR     JOINT REPLACEMENT     KNEE ARTHROSCOPY Left 2012   "torn meniscus"   POSTERIOR LUMBAR FUSION  02/2013   "L4-5; hardware in place"   PROSTATE BIOPSY  2013   PROSTATE SURGERY  2013   "brachytherapy for prostate radiation"   TONSILLECTOMY  1950s   TOTAL KNEE ARTHROPLASTY Left 12/18/2017   TOTAL KNEE ARTHROPLASTY Left 12/18/2017   Procedure: TOTAL KNEE ARTHROPLASTY;  Surgeon: Vickey Huger, MD;  Location: [facility];  Service: Orthopedics;  Laterality: Left;    Current Outpatient Medications  Medication Sig Dispense Refill   acetaminophen (TYLENOL) 500 MG tablet Take 500 mg by mouth every 6 (six) hours as needed.     Albuterol Sulfate (PROAIR HFA IN) Inhale 2 puffs into the lungs every 6 (six) hours as needed (for wheezing or shortness of breath).      atorvastatin (LIPITOR) 40 MG tablet Take 1 tablet (40 mg total) by mouth daily at 6 PM. 30 tablet 6   budesonide-formoterol (SYMBICORT) 80-4.5 MCG/ACT inhaler Inhale 2 puffs into the lungs 2 (two) times daily as needed (for  flares).      Cholecalciferol (VITAMIN D-3 PO) Take 1 capsule by mouth 2 (two) times a week.     clopidogrel (PLAVIX) 75 MG tablet TAKE 1 TABLET DAILY WITH BREAKFAST 90 tablet 3   Coenzyme Q10 (CO Q-10) 120 MG CAPS Take 120 mg by mouth 3 (three) times daily.     levothyroxine (SYNTHROID, LEVOTHROID) 125 MCG tablet Take 125 mcg by mouth daily before breakfast.     metoprolol succinate (TOPROL-XL) 50 MG 24 hr tablet Take 1 tablet (50 mg total) by mouth daily. Take with or immediately following a meal. 90 tablet 3   mexiletine (MEXITIL) 150 MG capsule Take 1 capsule (150 mg total) by mouth 2 (two) times daily. 180 capsule 3   Multiple Vitamins-Minerals (PRESERVISION AREDS  2 PO) Take 1 capsule by mouth 2 (two) times daily.     nitroGLYCERIN (NITROSTAT) 0.4 MG SL tablet Place 1 tablet (0.4 mg total) under the tongue every 5 (five) minutes as needed for chest pain (x 3 doses). 25 tablet 5   pantoprazole (PROTONIX) 40 MG tablet Take 1 tablet (40 mg total) by mouth daily. 90 tablet 3   prednisoLONE acetate (PRED FORTE) 1 % ophthalmic suspension Place 1 drop into the left eye 4 (four) times daily.     No current facility-administered medications for this visit.    Allergies  Allergen Reactions   Topiramate Anxiety, Palpitations and Other (See Comments)    blurred vision, tired, ringing in ears, restlessness, confusion, rapid heart rate, made him jittery   Celecoxib Other (See Comments)    GI Upset; did not mix with his Nexium/Caused acid relux     Pitavastatin Rash and Cough    LIVALO    Rosuvastatin Calcium Other (See Comments)    Muscle aches    Hydrocodone-Acetaminophen Other (See Comments)    Delusions     Social History   Socioeconomic History   Marital status: Married    Spouse name: Not on file   Number of children: Not on file   Years of education: Not on file   Highest education level: Not on file  Occupational History   Not on file  Tobacco Use   Smoking status: Never   Smokeless tobacco: Never  Vaping Use   Vaping Use: Never used  Substance and Sexual Activity   Alcohol use: Yes    Comment: 01/29/2016 "glass of wine q couple months"   Drug use: No   Sexual activity: Not Currently  Other Topics Concern   Not on file  Social History Narrative   Not on file   Social Determinants of Health   Financial Resource Strain: Not on file  Food Insecurity: Not on file  Transportation Needs: Not on file  Physical Activity: Not on file  Stress: Not on file  Social Connections: Not on file  Intimate Partner Violence: Not on file     Review of Systems: All other systems reviewed and are otherwise negative except as noted  above.  Physical Exam: Vitals:   04/23/21 0848  BP: 112/64  Pulse: 68  SpO2: 97%  Weight: 175 lb 9.6 oz (79.7 kg)  Height: 5' 7.5" (1.715 m)    GEN- The patient is well appearing, alert and oriented x 3 today.   HEENT: normocephalic, atraumatic; sclera clear, conjunctiva pink; hearing intact; oropharynx clear; neck supple, no JVP Lymph- no cervical lymphadenopathy Lungs- Clear to ausculation bilaterally, normal work of breathing.  No wheezes, rales, rhonchi Heart- Regular rate and  rhythm, no murmurs, rubs or gallops, PMI not laterally displaced GI- soft, non-tender, non-distended, bowel sounds present, no hepatosplenomegaly Extremities- no clubbing, cyanosis, or edema; DP/PT/radial pulses 2+ bilaterally MS- no significant deformity or atrophy Skin- warm and dry, no rash or lesion Psych- euthymic mood, full affect Neuro- strength and sensation are intact  EKG is not ordered.   Additional studies reviewed include: Previous EP office notes.  Myoview 04/14/21 The left ventricular ejection fraction is mildly decreased (45-54%). Nuclear stress EF: 50%. There was no ST segment deviation noted during stress. No T wave inversion was noted during stress. The study is normal. This is a low risk study.   Low risk stress nuclear study with normal perfusion and mildly reduced left ventricular global systolic function. Left ventricular ejection fraction has improved since 2019.  Echo 04/08/21 LVEF 45-50%, Grade 1 DD   Assessment and Plan:  1. CAD PCI in 2017 Denies anginal symptoms.  Continue on clopidogrel, statin, CCB (intolerant of BB)   2. PVCs Stable on mexiletine Asymptomatic. PVC burden 3.6% in 05/2020. No reason to change therapy at this time. On exam he is having PVCs every 4-5 beats today. Will start with updating echo as below.    3. HTN Stable on current regimen.    4. HLD Monitored and managed per his PCP.   5. DOE  Mixed history. Able to continue his ADLs  and mild to moderate exercise.  Cardiac work up relatively unremarkable as above. Diastolic > Systolic CHF.  6. Heart failure with mildly reduced EF EF 45-50% on recent Echo Continue toprol xl. Consider change to bisoprolol if SOB worsens. Volume status stable No clear volume overload on exam. Offered spironolactone with clear data in both systolic and diastolic CHF. He declines for now.  Prefers to make 1 change at a time. Can consider losartan at next visit if pt willing.  Shirley Friar, PA-C  04/23/21 9:10 AM

## 2021-04-23 ENCOUNTER — Other Ambulatory Visit: Payer: Self-pay

## 2021-04-23 ENCOUNTER — Encounter: Payer: Self-pay | Admitting: Student

## 2021-04-23 ENCOUNTER — Ambulatory Visit: Payer: Medicare HMO | Admitting: Student

## 2021-04-23 VITALS — BP 112/64 | HR 68 | Ht 67.5 in | Wt 175.6 lb

## 2021-04-23 DIAGNOSIS — I493 Ventricular premature depolarization: Secondary | ICD-10-CM

## 2021-04-23 DIAGNOSIS — I2510 Atherosclerotic heart disease of native coronary artery without angina pectoris: Secondary | ICD-10-CM | POA: Diagnosis not present

## 2021-04-23 DIAGNOSIS — R0600 Dyspnea, unspecified: Secondary | ICD-10-CM

## 2021-04-23 DIAGNOSIS — R0609 Other forms of dyspnea: Secondary | ICD-10-CM

## 2021-04-27 ENCOUNTER — Telehealth: Payer: Self-pay | Admitting: Student

## 2021-04-27 NOTE — Telephone Encounter (Signed)
Pt would liketo know if he can have  a referral sent to [facility] at [place] [phone]. He called the office and was told he needed a referral before he can be seen

## 2021-04-28 ENCOUNTER — Other Ambulatory Visit: Payer: Self-pay

## 2021-04-28 DIAGNOSIS — R0609 Other forms of dyspnea: Secondary | ICD-10-CM

## 2021-04-28 DIAGNOSIS — R0600 Dyspnea, unspecified: Secondary | ICD-10-CM

## 2021-04-28 NOTE — Telephone Encounter (Signed)
Referral placed and pt aware   

## 2021-05-18 ENCOUNTER — Encounter (INDEPENDENT_AMBULATORY_CARE_PROVIDER_SITE_OTHER): Payer: Medicare HMO | Admitting: Ophthalmology

## 2021-05-18 ENCOUNTER — Other Ambulatory Visit: Payer: Self-pay

## 2021-05-18 DIAGNOSIS — H59032 Cystoid macular edema following cataract surgery, left eye: Secondary | ICD-10-CM

## 2021-05-18 DIAGNOSIS — H353132 Nonexudative age-related macular degeneration, bilateral, intermediate dry stage: Secondary | ICD-10-CM | POA: Diagnosis not present

## 2021-05-18 DIAGNOSIS — I10 Essential (primary) hypertension: Secondary | ICD-10-CM | POA: Diagnosis not present

## 2021-05-18 DIAGNOSIS — H35373 Puckering of macula, bilateral: Secondary | ICD-10-CM

## 2021-05-18 DIAGNOSIS — H35033 Hypertensive retinopathy, bilateral: Secondary | ICD-10-CM

## 2021-05-18 DIAGNOSIS — H43813 Vitreous degeneration, bilateral: Secondary | ICD-10-CM

## 2021-06-03 ENCOUNTER — Other Ambulatory Visit: Payer: Self-pay

## 2021-06-03 ENCOUNTER — Ambulatory Visit: Payer: Medicare HMO | Admitting: Emergency Medicine

## 2021-06-03 ENCOUNTER — Encounter: Payer: Self-pay | Admitting: Emergency Medicine

## 2021-06-03 VITALS — BP 138/83 | HR 63 | Temp 98.2°F | Ht 67.5 in | Wt 176.0 lb

## 2021-06-03 DIAGNOSIS — R0609 Other forms of dyspnea: Secondary | ICD-10-CM | POA: Diagnosis not present

## 2021-06-03 DIAGNOSIS — J849 Interstitial pulmonary disease, unspecified: Secondary | ICD-10-CM | POA: Diagnosis not present

## 2021-06-03 NOTE — Patient Instructions (Addendum)
Continue Symbicort 2 puffs twice a day for now.  Rinse and gargle after using. Keep your albuterol available to use 2 puffs if needed for shortness of breath, wheezing, chest tightness We will repeat your pulmonary function testing in next office visit We will repeat your high-resolution CT scan of the chest to compare with 2019 Follow with Dr. Lamonte Sakai next available with full pulmonary function testing on the same day.

## 2021-06-03 NOTE — Progress Notes (Signed)
Subjective:    Patient ID: Duane Reyes, male    DOB: 07-10-1951, 70 y.o.   MRN: [id number]  HPI 70 year old never smoker with history of CAD, hypertension, prostate cancer, hypothyroidism and chronic renal insufficiency.  I have seen him remotely for shortness of breath.  Pulmonary function testing consistent with restrictive disease, question due to scoliosis. He is having progressive exertional SOB, difficulty doing yard work, trimming etc. Sometimes with walking up to 30 minutes, then has to rest. Will get SOB walking through [employer]. Does not wake him at night. Not really associated with cough. He does hear some occasional wheeze. He uses albuterol occasionally - does seem to help wheeze. His wt has been stable. He is on Symbicort bid - unclear when / why this was started. He has increased reflux when laying on L side, can cause some wheeze. No CP.   He just underwent TTE and cardiac stress testing in August that was reassuring   High-resolution CT scan of the chest 12/11/17 reviewed by me shows stable bilateral subpleural nodular disease with some mild subpleural basilar scarring without any traction bronchiectasis, honeycomb change or reticulation.  No clear evidence for interstitial lung disease  Pulmonary function testing 12/05/2017 reviewed by me, shows principally restriction, possibly some superimposed obstruction on spirometry, restricted lung volumes, decreased diffusion capacity that corrects to normal range when adjusted for alveolar volume.   Review of Systems As per HPI  Past Medical History:  Diagnosis Date   Arthritis    "left knee" (01/29/2016)   Bradycardia, drug induced 10/26/2016   Bronchiolitis    CAD in native artery    a. Abnl cardiac CT -> LHC 01/2016 s/p 50% mLAD, 25% OM2, 95% RPDA which was treated with overlapping DES.   Chronic knee pain    Chronic lower back pain    DOE (dyspnea on exertion)    Dyspnea    Frequent PVCs    a. 12/2015 - Holter showed SB,  NSR, ST, HR 54-108, with frequent PVCs in singles, couplets, and bigemy, with elevated PVC load 24%.   GERD (gastroesophageal reflux disease)    History of shingles    Hyperlipidemia    Hypertension    Hypothyroidism    Prostate cancer ([place])    Scoliosis    Spinal stenosis    Spondylolisthesis of lumbar region    Tortuosity of aortic arch    Tortuosity of aortic arch, -would not pursue Rt radial approach in this patient for future cardiac caths      Family History  Problem Relation Age of Onset   Heart attack Father    Heart disease Father      Social History   Socioeconomic History   Marital status: Married    Spouse name: Not on file   Number of children: Not on file   Years of education: Not on file   Highest education level: Not on file  Occupational History   Not on file  Tobacco Use   Smoking status: Never   Smokeless tobacco: Never  Vaping Use   Vaping Use: Never used  Substance and Sexual Activity   Alcohol use: Yes    Comment: 01/29/2016 "glass of wine q couple months"   Drug use: No   Sexual activity: Not Currently  Other Topics Concern   Not on file  Social History Narrative   Not on file   Social Determinants of Health   Financial Resource Strain: Not on file  Food Insecurity: Not on  file  Transportation Needs: Not on file  Physical Activity: Not on file  Stress: Not on file  Social Connections: Not on file  Intimate Partner Violence: Not on file    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   Allergies  Allergen Reactions   Topiramate Anxiety, Palpitations and Other (See Comments)    blurred vision, tired, ringing in ears, restlessness, confusion, rapid heart rate, made him jittery   Celecoxib Other (See Comments)    GI Upset; did not mix with his Nexium/Caused acid relux     Pitavastatin Rash and Cough    LIVALO    Rosuvastatin Calcium Other (See Comments)    Muscle aches     Hydrocodone-Acetaminophen Other (See Comments)    Delusions      Outpatient Medications Prior to Visit  Medication Sig Dispense Refill   acetaminophen (TYLENOL) 500 MG tablet Take 500 mg by mouth every 6 (six) hours as needed.     Albuterol Sulfate (PROAIR HFA IN) Inhale 2 puffs into the lungs every 6 (six) hours as needed (for wheezing or shortness of breath).      atorvastatin (LIPITOR) 40 MG tablet Take 1 tablet (40 mg total) by mouth daily at 6 PM. 30 tablet 6   budesonide-formoterol (SYMBICORT) 80-4.5 MCG/ACT inhaler Inhale 2 puffs into the lungs 2 (two) times daily as needed (for flares).      Cholecalciferol (VITAMIN D-3 PO) Take 1 capsule by mouth 2 (two) times a week.     clopidogrel (PLAVIX) 75 MG tablet TAKE 1 TABLET DAILY WITH BREAKFAST 90 tablet 3   Coenzyme Q10 (CO Q-10) 120 MG CAPS Take 120 mg by mouth 3 (three) times daily.     levothyroxine (SYNTHROID, LEVOTHROID) 125 MCG tablet Take 125 mcg by mouth daily before breakfast.     metoprolol succinate (TOPROL-XL) 50 MG 24 hr tablet Take 1 tablet (50 mg total) by mouth daily. Take with or immediately following a meal. 90 tablet 3   mexiletine (MEXITIL) 150 MG capsule Take 1 capsule (150 mg total) by mouth 2 (two) times daily. 180 capsule 3   Multiple Vitamins-Minerals (PRESERVISION AREDS 2 PO) Take 1 capsule by mouth 2 (two) times daily.     nitroGLYCERIN (NITROSTAT) 0.4 MG SL tablet Place 1 tablet (0.4 mg total) under the tongue every 5 (five) minutes as needed for chest pain (x 3 doses). 25 tablet 5   pantoprazole (PROTONIX) 40 MG tablet Take 1 tablet (40 mg total) by mouth daily. 90 tablet 3   prednisoLONE acetate (PRED FORTE) 1 % ophthalmic suspension Place 1 drop into the left eye 4 (four) times daily.     No facility-administered medications prior to visit.         Objective:   Physical Exam  Vitals:   06/03/21 1127  BP: 138/83  Pulse: 63  Temp: 98.2 F (36.8 C)  TempSrc: Oral  SpO2: 96%  Weight: 176 lb (79.8  kg)  Height: 5' 7.5" (1.715 m)   Gen: Pleasant, well-nourished, in no distress,  normal affect  ENT: No lesions,  mouth clear,  oropharynx clear, no postnasal drip  Neck: No JVD, no stridor  Lungs: No use of accessory muscles, clear but better air movement heard on L compared to R. No crackles or wheezing on normal respiration, no wheeze on forced expiration  Cardiovascular: RRR, heart sounds normal, no murmur or gallops, no peripheral edema  Musculoskeletal: scoliosis evident.   Neuro:  alert, awake, non focal  Skin: Warm, no lesions or rash      Assessment & Plan:   DOE (dyspnea on exertion) Progressive dyspnea, suspect due to restrictive disease from his kyphoscoliosis.  Reassuring cardiac evaluation August 2022.  Plan to repeat his pulmonary function testing, CT chest to compare with priors.  Unclear that he has significant obstructive lung disease.  We may be able stop his Symbicort going forward.  Continue Symbicort 2 puffs twice a day for now.  Rinse and gargle after using. Keep your albuterol available to use 2 puffs if needed for shortness of breath, wheezing, chest tightness We will repeat your pulmonary function testing in next office visit We will repeat your high-resolution CT scan of the chest to compare with 2019 Follow with Dr. Lamonte Sakai next available with full pulmonary function testing on the same day.    Baltazar Apo, MD, PhD 07/29/2021, 4:17 PM [facility] [phone] or if no answer before 7:00PM call [phone] For any issues after 7:00PM please call eLink [phone]

## 2021-06-09 ENCOUNTER — Other Ambulatory Visit: Payer: Self-pay

## 2021-06-09 ENCOUNTER — Ambulatory Visit (INDEPENDENT_AMBULATORY_CARE_PROVIDER_SITE_OTHER)
Admission: RE | Admit: 2021-06-09 | Discharge: 2021-06-09 | Disposition: A | Discharge status: Home / Self Care | Payer: Medicare HMO | Source: Ambulatory Visit | Attending: Emergency Medicine | Admitting: Emergency Medicine

## 2021-06-09 DIAGNOSIS — J849 Interstitial pulmonary disease, unspecified: Secondary | ICD-10-CM

## 2021-06-23 ENCOUNTER — Ambulatory Visit: Payer: Medicare HMO | Admitting: Student

## 2021-07-07 ENCOUNTER — Ambulatory Visit: Payer: Medicare HMO | Admitting: Emergency Medicine

## 2021-07-07 ENCOUNTER — Ambulatory Visit (INDEPENDENT_AMBULATORY_CARE_PROVIDER_SITE_OTHER): Payer: Medicare HMO | Admitting: Emergency Medicine

## 2021-07-07 ENCOUNTER — Encounter: Payer: Self-pay | Admitting: Emergency Medicine

## 2021-07-07 ENCOUNTER — Other Ambulatory Visit: Payer: Self-pay

## 2021-07-07 DIAGNOSIS — J984 Other disorders of lung: Secondary | ICD-10-CM | POA: Diagnosis not present

## 2021-07-07 DIAGNOSIS — J849 Interstitial pulmonary disease, unspecified: Secondary | ICD-10-CM

## 2021-07-07 LAB — PULMONARY FUNCTION TEST
DL/VA % pred: 101 %
DL/VA: 4.13 ml/min/mmHg/L
DLCO cor % pred: 66 %
DLCO cor: 15.49 ml/min/mmHg
DLCO unc % pred: 66 %
DLCO unc: 15.49 ml/min/mmHg
FEF 25-75 Post: 2.44 L/sec
FEF 25-75 Pre: 2.54 L/sec
FEF2575-%Change-Post: -3 %
FEF2575-%Pred-Post: 113 %
FEF2575-%Pred-Pre: 117 %
FEV1-%Change-Post: -2 %
FEV1-%Pred-Post: 71 %
FEV1-%Pred-Pre: 73 %
FEV1-Post: 2.04 L
FEV1-Pre: 2.09 L
FEV1FVC-%Change-Post: 1 %
FEV1FVC-%Pred-Pre: 117 %
FEV6-%Change-Post: -4 %
FEV6-%Pred-Post: 63 %
FEV6-%Pred-Pre: 66 %
FEV6-Post: 2.33 L
FEV6-Pre: 2.43 L
FEV6FVC-%Pred-Post: 106 %
FEV6FVC-%Pred-Pre: 106 %
FVC-%Change-Post: -4 %
FVC-%Pred-Post: 59 %
FVC-%Pred-Pre: 62 %
FVC-Post: 2.33 L
FVC-Pre: 2.43 L
Post FEV1/FVC ratio: 88 %
Post FEV6/FVC ratio: 100 %
Pre FEV1/FVC ratio: 86 %
Pre FEV6/FVC Ratio: 100 %
RV % pred: 74 %
RV: 1.71 L
TLC % pred: 65 %
TLC: 4.19 L

## 2021-07-07 NOTE — Assessment & Plan Note (Signed)
Reviewed his pulmonary function testing with him today.  Still with restrictive lung disease, slightly worse physiology than on his previous PFT in 2019.  His CT chest does not show ILD but does confirm basilar atelectasis left greater than right with some associated airway distortion in the left lower lobe.  These findings are most consistent with restrictive disease due to his significant scoliosis.  Discussed this with him today.  His go to start working with I-S, work on conditioning and exercise to maximize his functional capacity.  In absence of any clear obstructive lung disease we will do a trial off Symbicort to see if he misses it.  If so we can go back on it.  He can keep his albuterol available to use if needed.  We reviewed your CT scan of the chest and your pulmonary function testing today. We will try stopping Symbicort.  Keep track of how your breathing does.  If you miss this medication we can certainly restart it. Keep albuterol available to use 2 puffs if needed for shortness of breath, chest tightness, wheezing. Start using your incentive spirometer 1-2 times daily, 10 breaths each trial. Work on increasing your exercise, cardiopulmonary conditioning.  This will help your breathing. Follow with Dr Lamonte Sakai in 3 months or sooner if you have any problems.

## 2021-07-07 NOTE — Progress Notes (Signed)
Full PFT performed today. °

## 2021-07-07 NOTE — Patient Instructions (Signed)
We reviewed your CT scan of the chest and your pulmonary function testing today. We will try stopping Symbicort.  Keep track of how your breathing does.  If you miss this medication we can certainly restart it. Keep albuterol available to use 2 puffs if needed for shortness of breath, chest tightness, wheezing. Start using your incentive spirometer 1-2 times daily, 10 breaths each trial. Work on increasing your exercise, cardiopulmonary conditioning.  This will help your breathing. Follow with Dr Lamonte Sakai in 3 months or sooner if you have any problems.

## 2021-07-07 NOTE — Progress Notes (Signed)
Subjective:    Patient ID: Duane Reyes, male    DOB: 1950/11/21, 70 y.o.   MRN: [id number]  HPI 70 year old never smoker with history of CAD, hypertension, prostate cancer, hypothyroidism and chronic renal insufficiency.  I have seen him remotely for shortness of breath.  Pulmonary function testing consistent with restrictive disease, question due to scoliosis. He is having progressive exertional SOB, difficulty doing yard work, trimming etc. Sometimes with walking up to 30 minutes, then has to rest. Will get SOB walking through [employer]. Does not wake him at night. Not really associated with cough. He does hear some occasional wheeze. He uses albuterol occasionally - does seem to help wheeze. His wt has been stable. He is on Symbicort bid - unclear when / why this was started. He has increased reflux when laying on L side, can cause some wheeze. No CP.   He just underwent TTE and cardiac stress testing in August that was reassuring   High-resolution CT scan of the chest 12/11/17 reviewed by me shows stable bilateral subpleural nodular disease with some mild subpleural basilar scarring without any traction bronchiectasis, honeycomb change or reticulation.  No clear evidence for interstitial lung disease  Pulmonary function testing 12/05/2017 reviewed by me, shows principally restriction, possibly some superimposed obstruction on spirometry, restricted lung volumes, decreased diffusion capacity that corrects to normal range when adjusted for alveolar volume.    ROV 07/07/21 --follow-up visit for 70 year old gentleman with a history of restrictive lung disease in the setting of scoliosis.[name]  He is been dealing with progressive exertional dyspnea, has had reassuring cardiac evaluation in August 2022.  I repeated his pulmonary function testing and CT chest to further evaluate.  He has been on Symbicort, unclear whether he has been obstructive lung disease. He uses reliably, unsure whether it impacts him  much. He does have some rare wheeze, seems to be improved   Pulmonary function testing performed today and reviewed by me shows spirometry principally with restriction.  There is no bronchodilator response.  His lung volumes are also restricted, diffusion capacity decreased but corrects to the normal range when adjusted for his alveolar volume.  High-resolution CT scan of the chest 06/09/2021 reviewed by me and compared with 12/11/2017 shows no mediastinal or hilar adenopathy, mild cylindrical bronchiectatic change in the bilateral lower lobes, stable.  No evidence of groundglass, subpleural reticulation or any evidence of interstitial lung disease.  Question some mild air trapping.  Stable small pulmonary nodules.   Review of Systems As per HPI  Past Medical History:  Diagnosis Date   Arthritis    "left knee" (01/29/2016)   Bradycardia, drug induced 10/26/2016   Bronchiolitis    CAD in native artery    a. Abnl cardiac CT -> LHC 01/2016 s/p 50% mLAD, 25% OM2, 95% RPDA which was treated with overlapping DES.   Chronic knee pain    Chronic lower back pain    DOE (dyspnea on exertion)    Dyspnea    Frequent PVCs    a. 12/2015 - Holter showed SB, NSR, ST, HR 54-108, with frequent PVCs in singles, couplets, and bigemy, with elevated PVC load 24%.   GERD (gastroesophageal reflux disease)    History of shingles    Hyperlipidemia    Hypertension    Hypothyroidism    Prostate cancer ([place])    Scoliosis    Spinal stenosis    Spondylolisthesis of lumbar region    Tortuosity of aortic arch    Tortuosity of aortic  arch, -would not pursue Rt radial approach in this patient for future cardiac caths      Family History  Problem Relation Age of Onset   Heart attack Father    Heart disease Father      Social History   Socioeconomic History   Marital status: Married    Spouse name: Not on file   Number of children: Not on file   Years of education: Not on file   Highest education level: Not  on file  Occupational History   Not on file  Tobacco Use   Smoking status: Never   Smokeless tobacco: Never  Vaping Use   Vaping Use: Never used  Substance and Sexual Activity   Alcohol use: Yes    Comment: 01/29/2016 "glass of wine q couple months"   Drug use: No   Sexual activity: Not Currently  Other Topics Concern   Not on file  Social History Narrative   Not on file   Social Determinants of Health   Financial Resource Strain: Not on file  Food Insecurity: Not on file  Transportation Needs: Not on file  Physical Activity: Not on file  Stress: Not on file  Social Connections: Not on file  Intimate Partner Violence: Not on file    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   Allergies  Allergen Reactions   Topiramate Anxiety, Palpitations and Other (See Comments)    blurred vision, tired, ringing in ears, restlessness, confusion, rapid heart rate, made him jittery   Celecoxib Other (See Comments)    GI Upset; did not mix with his Nexium/Caused acid relux     Pitavastatin Rash and Cough    LIVALO    Rosuvastatin Calcium Other (See Comments)    Muscle aches    Hydrocodone-Acetaminophen Other (See Comments)    Delusions      Outpatient Medications Prior to Visit  Medication Sig Dispense Refill   acetaminophen (TYLENOL) 500 MG tablet Take 500 mg by mouth every 6 (six) hours as needed.     Albuterol Sulfate (PROAIR HFA IN) Inhale 2 puffs into the lungs every 6 (six) hours as needed (for wheezing or shortness of breath).      atorvastatin (LIPITOR) 40 MG tablet Take 1 tablet (40 mg total) by mouth daily at 6 PM. 30 tablet 6   budesonide-formoterol (SYMBICORT) 80-4.5 MCG/ACT inhaler Inhale 2 puffs into the lungs 2 (two) times daily as needed (for flares).      Cholecalciferol (VITAMIN D-3 PO) Take 1 capsule by mouth 2 (two) times a week.     clopidogrel (PLAVIX) 75 MG tablet TAKE 1 TABLET DAILY WITH BREAKFAST 90 tablet 3   Coenzyme  Q10 (CO Q-10) 120 MG CAPS Take 120 mg by mouth 3 (three) times daily.     levothyroxine (SYNTHROID, LEVOTHROID) 125 MCG tablet Take 125 mcg by mouth daily before breakfast.     metoprolol succinate (TOPROL-XL) 50 MG 24 hr tablet Take 1 tablet (50 mg total) by mouth daily. Take with or immediately following a meal. 90 tablet 3   mexiletine (MEXITIL) 150 MG capsule Take 1 capsule (150 mg total) by mouth 2 (two) times daily. 180 capsule 3   Multiple Vitamins-Minerals (PRESERVISION AREDS 2 PO) Take 1 capsule by mouth 2 (two) times daily.     nitroGLYCERIN (NITROSTAT) 0.4 MG SL tablet Place 1 tablet (0.4 mg total) under the tongue every 5 (five) minutes as needed for chest pain (  x 3 doses). 25 tablet 5   pantoprazole (PROTONIX) 40 MG tablet Take 1 tablet (40 mg total) by mouth daily. 90 tablet 3   prednisoLONE acetate (PRED FORTE) 1 % ophthalmic suspension Place 1 drop into the left eye 4 (four) times daily.     No facility-administered medications prior to visit.         Objective:   Physical Exam  Vitals:   07/07/21 1208  BP: 116/68  Pulse: 79  Temp: 98.1 F (36.7 C)  TempSrc: Oral  SpO2: 98%  Weight: 176 lb (79.8 kg)  Height: 5\' 7"  (1.702 m)   Gen: Pleasant, well-nourished, in no distress,  normal affect  ENT: No lesions,  mouth clear,  oropharynx clear, no postnasal drip  Neck: No JVD, no stridor  Lungs: No use of accessory muscles, clear but better air movement heard on L compared to R. No crackles or wheezing on normal respiration, no wheeze on forced expiration  Cardiovascular: RRR, heart sounds normal, no murmur or gallops, no peripheral edema  Musculoskeletal: scoliosis evident.   Neuro: alert, awake, non focal  Skin: Warm, no lesions or rash      Assessment & Plan:   Restrictive lung disease Reviewed his pulmonary function testing with him today.  Still with restrictive lung disease, slightly worse physiology than on his previous PFT in 2019.  His CT chest does  not show ILD but does confirm basilar atelectasis left greater than right with some associated airway distortion in the left lower lobe.  These findings are most consistent with restrictive disease due to his significant scoliosis.  Discussed this with him today.  His go to start working with I-S, work on conditioning and exercise to maximize his functional capacity.  In absence of any clear obstructive lung disease we will do a trial off Symbicort to see if he misses it.  If so we can go back on it.  He can keep his albuterol available to use if needed.  We reviewed your CT scan of the chest and your pulmonary function testing today. We will try stopping Symbicort.  Keep track of how your breathing does.  If you miss this medication we can certainly restart it. Keep albuterol available to use 2 puffs if needed for shortness of breath, chest tightness, wheezing. Start using your incentive spirometer 1-2 times daily, 10 breaths each trial. Work on increasing your exercise, cardiopulmonary conditioning.  This will help your breathing. Follow with Dr Lamonte Sakai in 3 months or sooner if you have any problems.   Baltazar Apo, MD, PhD 07/07/2021, 1:57 PM [facility] [phone] or if no answer before 7:00PM call [phone] For any issues after 7:00PM please call eLink [phone]

## 2021-07-07 NOTE — Patient Instructions (Signed)
Full PFT performed today. °

## 2021-07-19 ENCOUNTER — Encounter (INDEPENDENT_AMBULATORY_CARE_PROVIDER_SITE_OTHER): Payer: Medicare HMO | Admitting: Ophthalmology

## 2021-07-19 ENCOUNTER — Other Ambulatory Visit: Payer: Self-pay

## 2021-07-19 DIAGNOSIS — I10 Essential (primary) hypertension: Secondary | ICD-10-CM

## 2021-07-19 DIAGNOSIS — H43813 Vitreous degeneration, bilateral: Secondary | ICD-10-CM

## 2021-07-19 DIAGNOSIS — H35033 Hypertensive retinopathy, bilateral: Secondary | ICD-10-CM | POA: Diagnosis not present

## 2021-07-19 DIAGNOSIS — H59032 Cystoid macular edema following cataract surgery, left eye: Secondary | ICD-10-CM | POA: Diagnosis not present

## 2021-07-19 DIAGNOSIS — H353132 Nonexudative age-related macular degeneration, bilateral, intermediate dry stage: Secondary | ICD-10-CM

## 2021-07-19 DIAGNOSIS — H2511 Age-related nuclear cataract, right eye: Secondary | ICD-10-CM

## 2021-07-19 DIAGNOSIS — H35373 Puckering of macula, bilateral: Secondary | ICD-10-CM

## 2021-07-29 ENCOUNTER — Telehealth: Payer: Self-pay | Admitting: *Deleted

## 2021-07-29 NOTE — Assessment & Plan Note (Signed)
Progressive dyspnea, suspect due to restrictive disease from his kyphoscoliosis.  Reassuring cardiac evaluation August 2022.  Plan to repeat his pulmonary function testing, CT chest to compare with priors.  Unclear that he has significant obstructive lung disease.  We may be able stop his Symbicort going forward.  Continue Symbicort 2 puffs twice a day for now.  Rinse and gargle after using. Keep your albuterol available to use 2 puffs if needed for shortness of breath, wheezing, chest tightness We will repeat your pulmonary function testing in next office visit We will repeat your high-resolution CT scan of the chest to compare with 2019 Follow with Dr. Lamonte Sakai next available with full pulmonary function testing on the same day.

## 2021-07-29 NOTE — Telephone Encounter (Deleted)
    Patient Name: Duane Reyes  DOB: 1951/07/06 MRN: [id number]  Primary Cardiologist: Fransico Him, MD  Chart reviewed as part of pre-operative protocol coverage. Given past medical history and time since last visit, based on ACC/AHA guidelines, [name] would be at acceptable risk for the planned procedure without further cardiovascular testing.   The patient was advised that if he develops new symptoms prior to surgery to contact our office to arrange for a follow-up visit, and he verbalized understanding.  I will route this recommendation to the requesting party via Epic fax function and remove from pre-op pool.  Please call with questions.  Darreld Mclean, PA-C 07/29/2021, 8:48 PM

## 2021-07-29 NOTE — Telephone Encounter (Signed)
   Pre-operative Risk Assessment    Patient Name: Duane Reyes  DOB: July 12, 1951 MRN: [id number]      Request for Surgical Clearance   Procedure:   T10-11 TRANSLAMINAR EPIDURAL STEROID INJECTION  Date of Surgery: Clearance 08/31/21                                 Surgeon:  DR. Kristeen Miss Surgeon's Group or Practice Name:  [facility] Phone number:  [phone] Fax number:  [phone] ATTN: JESSICA   Type of Clearance Requested: - Medical  - Pharmacy:  Hold Clopidogrel (Plavix)     Type of Anesthesia:   IV SEDATION   Additional requests/questions:   Jiles Prows   07/29/2021, 2:39 PM

## 2021-07-30 NOTE — Telephone Encounter (Signed)
Primary Cardiologist:Traci Turner, MD  Chart reviewed as part of pre-operative protocol coverage. Because of Tarig Zimmers Ludwick's past medical history and time since last visit, he/she will require a follow-up visit in order to better assess preoperative cardiovascular risk.  Pre-op covering staff: - Please schedule appointment and call patient to inform them. - Please contact requesting surgeon's office via preferred method (i.e, phone, fax) to inform them of need for appointment prior to surgery.  If applicable, this message will also be routed to pharmacy pool and/or primary cardiologist for input on holding anticoagulant/antiplatelet agent as requested below so that this information is available at time of patient's appointment.   Deberah Pelton, NP  07/30/2021, 8:58 AM

## 2021-07-30 NOTE — Telephone Encounter (Signed)
S/w the pt and he is agreeable to pre op appt needed. Pt will be out of town the week of Dec 6-9th. Pt has been scheduled to see Laurann Montana, NP at the [place] location 08/09/21 @ 3:35 pm. I will forward notes to NP for upcoming appt. Will send FYI to requesting office the pt has appt 08/09/21.

## 2021-08-08 ENCOUNTER — Encounter (HOSPITAL_BASED_OUTPATIENT_CLINIC_OR_DEPARTMENT_OTHER): Payer: Self-pay | Admitting: Nurse Practitioner

## 2021-08-08 NOTE — Progress Notes (Signed)
Office Visit    Patient Name: Duane Reyes Date of Encounter: 08/09/2021  Primary Care Provider:  [name], MD Primary Cardiologist:  Fransico Him, MD  Chief Complaint    70 year old male with a history of CAD s/p DES x2 RPDA in 2017 , frequent PVCs (followed by EP), chronic combined systolic and diastolic heart failure, hypertension, hyperlipidemia, chronic dyspnea on exertion, restrictive lung disease, and tortuosity of aortic arch who presents for follow-up to preop clearance for upcoming T10-11 translaminar epidural steroid injection at the request of Dr. Kristeen Miss.  Past Medical History    Past Medical History:  Diagnosis Date   Arthritis    "left knee" (01/29/2016)   Bradycardia, drug induced 10/26/2016   Bronchiolitis    CAD in native artery    a. Abnl cardiac CT ; b. LHC 01/2016 s/p 50% mLAD, 25% OM2, 95% RPDA which was treated with overlapping DES; c. MV 03/2021 negative for ischemia   Chronic combined systolic and diastolic heart failure (HCC)    HFmrEF, echo 03/2021: EF 45-50%, GIDD.   Chronic knee pain    Chronic lower back pain    DOE (dyspnea on exertion)    Dyspnea    Frequent PVCs    a. 12/2015 - Holter showed SB, NSR, ST, HR 54-108, with frequent PVCs in singles, couplets, and bigemy, with elevated PVC load 24%.   GERD (gastroesophageal reflux disease)    History of shingles    Hyperlipidemia    Hypertension    Hypothyroidism    Prostate cancer ([place])    Scoliosis    Spinal stenosis    Spondylolisthesis of lumbar region    Tortuosity of aortic arch    Tortuosity of aortic arch, -would not pursue Rt radial approach in this patient for future cardiac caths    Past Surgical History:  Procedure Laterality Date   ANGIOPLASTY     BACK SURGERY     CARDIAC CATHETERIZATION N/A 01/29/2016   Procedure: Right/Left Heart Cath and Coronary Angiography;  Surgeon: Jettie Booze, MD;  Location: [facility];  Service: Cardiovascular;   Laterality: N/A;   CARDIAC CATHETERIZATION N/A 01/29/2016   Procedure: Coronary Stent Intervention 2.5/16mm Synergy-proximal PDA;  Surgeon: Jettie Booze, MD;  Location: [facility];  Service: Cardiovascular;  Laterality: N/A;   CATARACT EXTRACTION W/ INTRAOCULAR LENS IMPLANT Left 11/2015   COLONOSCOPY     CORONARY ANGIOPLASTY WITH STENT PLACEMENT  01/29/2016   "2 stents"   CYST EXCISION Right 2005   "near bicep"   ESOPHAGOGASTRODUODENOSCOPY     EYE SURGERY     INGUINAL HERNIA REPAIR     JOINT REPLACEMENT     KNEE ARTHROSCOPY Left 2012   "torn meniscus"   POSTERIOR LUMBAR FUSION  02/2013   "L4-5; hardware in place"   PROSTATE BIOPSY  2013   PROSTATE SURGERY  2013   "brachytherapy for prostate radiation"   TONSILLECTOMY  1950s   TOTAL KNEE ARTHROPLASTY Left 12/18/2017   TOTAL KNEE ARTHROPLASTY Left 12/18/2017   Procedure: TOTAL KNEE ARTHROPLASTY;  Surgeon: Vickey Huger, MD;  Location: [place];  Service: Orthopedics;  Laterality: Left;    Allergies  Allergies  Allergen Reactions   Topiramate Anxiety, Palpitations and Other (See Comments)    blurred vision, tired, ringing in ears, restlessness, confusion, rapid heart rate, made him jittery   Celecoxib Other (See Comments)    GI Upset; did not mix with his Nexium/Caused acid relux  Pitavastatin Rash and Cough    LIVALO    Rosuvastatin Calcium Other (See Comments)    Muscle aches    Hydrocodone-Acetaminophen Other (See Comments)    Delusions     History of Present Illness    70 year old male with a past medical history including CAD s/p DES x2 RPDA in 2017, frequent PVCs (followed by EP), chronic combined systolic and diastolic heart failure, hypertension, hyperlipidemia, chronic dyspnea on exertion, restrictive lung disease, and tortuosity of aortic arch.  He last saw Oda Kilts, [place],, with EP, on 04/23/2021. At the time, he reported moderate dyspnea on exertion. Prior recent updated ischemic evaluation including  echocardiogram and Lexiscan Myoview showed mildly reduced EF, MV negative for ischemia.  He declined escalation of GDMT with spironolactone at the time.   He presents today for follow-up and preop clearance for upcoming T10-11 translaminar epidural steroid injection at the request of Dr. Kristeen Miss. Since his last visit he has been stable from a cardiac standpoint.  He does report stable chronic mild dyspnea on exertion, unchanged from prior visits, he is following with pulmonology for restrictive lung disease.   Home Medications    Current Outpatient Medications  Medication Sig Dispense Refill   acetaminophen (TYLENOL) 500 MG tablet Take 500 mg by mouth every 6 (six) hours as needed.     Albuterol Sulfate (PROAIR HFA IN) Inhale 2 puffs into the lungs every 6 (six) hours as needed (for wheezing or shortness of breath).      atorvastatin (LIPITOR) 40 MG tablet Take 1 tablet (40 mg total) by mouth daily at 6 PM. 30 tablet 6   budesonide-formoterol (SYMBICORT) 80-4.5 MCG/ACT inhaler Inhale 2 puffs into the lungs 2 (two) times daily as needed (for flares).      Cholecalciferol (VITAMIN D-3 PO) Take 1 capsule by mouth 2 (two) times a week.     clopidogrel (PLAVIX) 75 MG tablet TAKE 1 TABLET DAILY WITH BREAKFAST 90 tablet 3   Coenzyme Q10 (CO Q-10) 120 MG CAPS Take 120 mg by mouth 3 (three) times daily.     levothyroxine (SYNTHROID, LEVOTHROID) 125 MCG tablet Take 125 mcg by mouth daily before breakfast.     mexiletine (MEXITIL) 150 MG capsule Take 1 capsule (150 mg total) by mouth 2 (two) times daily. 180 capsule 3   Multiple Vitamins-Minerals (PRESERVISION AREDS 2 PO) Take 1 capsule by mouth 2 (two) times daily.     nitroGLYCERIN (NITROSTAT) 0.4 MG SL tablet Place 1 tablet (0.4 mg total) under the tongue every 5 (five) minutes as needed for chest pain (x 3 doses). 25 tablet 5   pantoprazole (PROTONIX) 40 MG tablet Take 1 tablet (40 mg total) by mouth daily. 90 tablet 3   prednisoLONE acetate (PRED  FORTE) 1 % ophthalmic suspension Place 1 drop into the left eye 4 (four) times daily.     metoprolol succinate (TOPROL-XL) 50 MG 24 hr tablet Take 1 tablet (50 mg total) by mouth daily. Take with or immediately following a meal. 90 tablet 3   No current facility-administered medications for this visit.     Review of Systems    Chronic stable mild dyspnea on exertion. He denies chest pain, palpitations, pnd, orthopnea, n, v, dizziness, syncope, edema, weight gain, or early satiety. All other systems reviewed and are otherwise negative except as noted above.     Physical Exam    VS:  BP 130/84   Pulse 63   Ht 5\' 7"  (1.702 m)   Wt  179 lb 12.8 oz (81.6 kg)   SpO2 99%   BMI 28.16 kg/m   GEN: Well nourished, well developed, in no acute distress. HEENT: normal. Neck: Supple, no JVD, carotid bruits, or masses. Cardiac: RRR, no murmurs, rubs, or gallops. No clubbing, cyanosis, edema.  Radials/DP/PT 2+ and equal bilaterally.  Respiratory:  Respirations regular and unlabored, clear to auscultation bilaterally. GI: Soft, nontender, nondistended, BS + x 4. MS: no deformity or atrophy. Skin: warm and dry, no rash. Neuro:  Strength and sensation are intact. Psych: Normal affect.  Accessory Clinical Findings    ECG personally reviewed by me today - NSR, 63 bpm, incomplete RBBB, LAD - no acute changes.  Lab Results  Component Value Date   WBC 10.7 (H) 09/15/2020   HGB 15.2 09/15/2020   HCT 47.3 09/15/2020   MCV 99.6 09/15/2020   PLT 178 09/15/2020   Lab Results  Component Value Date   CREATININE 0.96 09/15/2020   BUN 22 09/15/2020   NA 142 09/15/2020   K 4.5 09/15/2020   CL 108 09/15/2020   CO2 27 09/15/2020   Lab Results  Component Value Date   ALT 17 12/07/2017   AST 17 12/07/2017   ALKPHOS 53 12/07/2017   BILITOT 1.1 12/07/2017   Lab Results  Component Value Date   CHOL 149 10/26/2016   HDL 53 10/26/2016   LDLCALC 73 10/26/2016   TRIG 116 10/26/2016   CHOLHDL 2.8  10/26/2016    Lab Results  Component Value Date   HGBA1C 5.8 (H) 04/29/2015    Assessment & Plan    1. CAD: PCI/DES x2 in 2017, negative MV 03/2021. No symptoms of angina. Chronic dyspnea in setting of chronic combined HF, inactivity, underlying restrictive lung disease. Recent ischemic evaluation reassuring. Following with pulmonology. No further ischemic evaluation warranted at this time. Not on aspirin due to chronic Plavix use. Continue Plavix 75 mg daily (ok per [name] to hold for upcoming epidural injection), atorvastatin 40 mg daily, and metoprolol succinate 50 mg daily.  2. Chronic combined systolic and diastolic heart failure/chronic stable DOE: EF 45 to 50% per most recent echo. Euvolemic on exam today. DOE as above. Discussed potential transition from metoprolol to bisoprolol given restrictive lung disease/chronic dyspnea, also discussed escalation of GDMT with addition of ARB. He declines any changes to his medication regiment at this time. Previously declined addition of spironolactone. Continue Toprol-XL 50 mg daily.  3. PVCs: Follows with EP. PVC burden 3.6 in 05/2020.  Managed on mexiletine, Toprol-XL.   4. HTN: BP 130/84 in office today. Reports home readings below 130/80. Continue meds as above.   5. HLD: Monitored and managed by PCP.  Continue statin as above.  6. Cardiac Pre-op Clearance:  According to the Revised Cardiac Risk Index (RCRI), his Perioperative Risk of Major Cardiac Event is (%): 6.6. His Functional Capacity in METs is: 7.34 according to the [facility] Activity Status Index (DASI). Therefore, based on ACC/AHA guidelines, patient would be at acceptable risk for the planned procedure without further cardiovascular testing. I will route this recommendation to the requesting party via Epic fax function. Per [name], ok to hold Plavix prior to procedure. Given history of CAD, please resume Plavix as soon as possible post-procedure.  7. Disposition: Follow-up with  pulmonology as scheduled. Follow-up [name] or APP in 6 months.   Lenna Sciara, NP 08/09/2021, 4:36 PM

## 2021-08-09 ENCOUNTER — Ambulatory Visit (HOSPITAL_BASED_OUTPATIENT_CLINIC_OR_DEPARTMENT_OTHER): Payer: Medicare HMO | Admitting: Nurse Practitioner

## 2021-08-09 ENCOUNTER — Other Ambulatory Visit: Payer: Self-pay

## 2021-08-09 ENCOUNTER — Encounter (HOSPITAL_BASED_OUTPATIENT_CLINIC_OR_DEPARTMENT_OTHER): Payer: Self-pay | Admitting: Nurse Practitioner

## 2021-08-09 VITALS — BP 130/84 | HR 63 | Ht 67.0 in | Wt 179.8 lb

## 2021-08-09 DIAGNOSIS — E785 Hyperlipidemia, unspecified: Secondary | ICD-10-CM

## 2021-08-09 DIAGNOSIS — I10 Essential (primary) hypertension: Secondary | ICD-10-CM | POA: Diagnosis not present

## 2021-08-09 DIAGNOSIS — I5042 Chronic combined systolic (congestive) and diastolic (congestive) heart failure: Secondary | ICD-10-CM

## 2021-08-09 DIAGNOSIS — Z01810 Encounter for preprocedural cardiovascular examination: Secondary | ICD-10-CM

## 2021-08-09 DIAGNOSIS — I2510 Atherosclerotic heart disease of native coronary artery without angina pectoris: Secondary | ICD-10-CM

## 2021-08-09 DIAGNOSIS — R0609 Other forms of dyspnea: Secondary | ICD-10-CM | POA: Diagnosis not present

## 2021-08-09 DIAGNOSIS — I493 Ventricular premature depolarization: Secondary | ICD-10-CM

## 2021-08-09 NOTE — Patient Instructions (Signed)
Medication Instructions:  Your Physician recommend you continue on your current medication as directed.    *If you need a refill on your cardiac medications before your next appointment, please call your pharmacy*   Lab Work: None ordered today    Testing/Procedures: None ordered today    Follow-Up: At [facility], you and your health needs are our priority.  As part of our continuing mission to provide you with exceptional heart care, we have created designated Provider Care Teams.  These Care Teams include your primary Cardiologist (physician) and Advanced Practice Providers (APPs -  Physician Assistants and Nurse Practitioners) who all work together to provide you with the care you need, when you need it.  We recommend signing up for the patient portal called "MyChart".  Sign up information is provided on this After Visit Summary.  MyChart is used to connect with patients for Virtual Visits (Telemedicine).  Patients are able to view lab/test results, encounter notes, upcoming appointments, etc.  Non-urgent messages can be sent to your provider as well.   To learn more about what you can do with MyChart, go to [email or web address].    Your next appointment:   6 month(s)  The format for your next appointment:   In Person  Provider:   Fransico Him, MD     Other Instructions May hold Plavix as instructed!

## 2021-09-20 ENCOUNTER — Other Ambulatory Visit: Payer: Self-pay

## 2021-09-20 ENCOUNTER — Encounter (INDEPENDENT_AMBULATORY_CARE_PROVIDER_SITE_OTHER): Payer: Medicare HMO | Admitting: Ophthalmology

## 2021-09-20 DIAGNOSIS — H353132 Nonexudative age-related macular degeneration, bilateral, intermediate dry stage: Secondary | ICD-10-CM

## 2021-09-20 DIAGNOSIS — H59031 Cystoid macular edema following cataract surgery, right eye: Secondary | ICD-10-CM | POA: Diagnosis not present

## 2021-09-20 DIAGNOSIS — I10 Essential (primary) hypertension: Secondary | ICD-10-CM | POA: Diagnosis not present

## 2021-09-20 DIAGNOSIS — H35033 Hypertensive retinopathy, bilateral: Secondary | ICD-10-CM | POA: Diagnosis not present

## 2021-09-20 DIAGNOSIS — H43813 Vitreous degeneration, bilateral: Secondary | ICD-10-CM

## 2021-09-20 DIAGNOSIS — H2511 Age-related nuclear cataract, right eye: Secondary | ICD-10-CM

## 2021-09-23 ENCOUNTER — Other Ambulatory Visit: Payer: Self-pay | Admitting: Cardiology

## 2021-09-24 ENCOUNTER — Other Ambulatory Visit: Payer: Self-pay | Admitting: *Deleted

## 2021-09-24 MED ORDER — METOPROLOL SUCCINATE ER 50 MG PO TB24: 50.0000 mg | ORAL_TABLET | Freq: Every day | ORAL | 3 refills | Status: DC

## 2021-09-28 ENCOUNTER — Other Ambulatory Visit: Payer: Self-pay | Admitting: *Deleted

## 2021-09-28 MED ORDER — METOPROLOL SUCCINATE ER 50 MG PO TB24: 50.0000 mg | ORAL_TABLET | Freq: Every day | ORAL | 3 refills | Status: DC

## 2021-10-07 ENCOUNTER — Ambulatory Visit: Payer: Medicare HMO | Admitting: Emergency Medicine

## 2021-10-07 ENCOUNTER — Other Ambulatory Visit: Payer: Self-pay

## 2021-10-07 ENCOUNTER — Encounter: Payer: Self-pay | Admitting: Emergency Medicine

## 2021-10-07 DIAGNOSIS — J984 Other disorders of lung: Secondary | ICD-10-CM | POA: Diagnosis not present

## 2021-10-07 MED ORDER — ALBUTEROL SULFATE HFA 108 (90 BASE) MCG/ACT IN AERS: 2.0000 | INHALATION_SPRAY | Freq: Four times a day (QID) | RESPIRATORY_TRACT | 6 refills | Status: AC | PRN

## 2021-10-07 NOTE — Patient Instructions (Signed)
Stop using Symbicort We will refill your albuterol.  You can use 2 puffs when you need it for wheezing, shortness of breath, cough. Use your incentive spirometer every day Try to increase your exercise, work on your conditioning and stamina.  This will help your overall breathing. Follow with Dr. Lamonte Sakai if needed for any new respiratory symptoms, progressive shortness of breath

## 2021-10-07 NOTE — Assessment & Plan Note (Signed)
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  No evidence of obstruction on his PFT although he does still sometimes use bronchodilators for intermittent wheezing.  He has been using Symbicort because is what he had available.  Not taking this as maintenance therapy.  We will discontinue it (again).  We will refill his albuterol, okay for him to use this intermittently for symptom relief.  I think you will benefit the most from working on his cardiopulmonary conditioning, incentive spirometry, etc.  Stop using Symbicort We will refill your albuterol.  You can use 2 puffs when you need it for wheezing, shortness of breath, cough. Use your incentive spirometer every day Try to increase your exercise, work on your conditioning and stamina.  This will help your overall breathing. Follow with Duane Reyes if needed for any new respiratory symptoms, progressive shortness of breath

## 2021-10-07 NOTE — Progress Notes (Signed)
Subjective:    Patient ID: Duane Reyes, male    DOB: November 05, 1950, 71 y.o.   MRN: [id number]  HPI 71 year old never smoker with history of CAD, hypertension, prostate cancer, hypothyroidism and chronic renal insufficiency.  I have seen him remotely for shortness of breath.  Pulmonary function testing consistent with restrictive disease, question due to scoliosis. He is having progressive exertional SOB, difficulty doing yard work, trimming etc. Sometimes with walking up to 30 minutes, then has to rest. Will get SOB walking through [employer]. Does not wake him at night. Not really associated with cough. He does hear some occasional wheeze. He uses albuterol occasionally - does seem to help wheeze. His wt has been stable. He is on Symbicort bid - unclear when / why this was started. He has increased reflux when laying on L side, can cause some wheeze. No CP.   He just underwent TTE and cardiac stress testing in August that was reassuring   High-resolution CT scan of the chest 12/11/17 reviewed by me shows stable bilateral subpleural nodular disease with some mild subpleural basilar scarring without any traction bronchiectasis, honeycomb change or reticulation.  No clear evidence for interstitial lung disease  Pulmonary function testing 12/05/2017 reviewed by me, shows principally restriction, possibly some superimposed obstruction on spirometry, restricted lung volumes, decreased diffusion capacity that corrects to normal range when adjusted for alveolar volume.   ROV 07/07/21 --follow-up visit for 71 year old gentleman with a history of restrictive lung disease in the setting of scoliosis.[name]  He is been dealing with progressive exertional dyspnea, has had reassuring cardiac evaluation in August 2022.  I repeated his pulmonary function testing and CT chest to further evaluate.  He has been on Symbicort, unclear whether he has been obstructive lung disease. He uses reliably, unsure whether it impacts him  much. He does have some rare wheeze, seems to be improved   Pulmonary function testing performed today and reviewed by me shows spirometry principally with restriction.  There is no bronchodilator response.  His lung volumes are also restricted, diffusion capacity decreased but corrects to the normal range when adjusted for his alveolar volume.  High-resolution CT scan of the chest 06/09/2021 reviewed by me and compared with 12/11/2017 shows no mediastinal or hilar adenopathy, mild cylindrical bronchiectatic change in the bilateral lower lobes, stable.  No evidence of groundglass, subpleural reticulation or any evidence of interstitial lung disease.  Question some mild air trapping.  Stable small pulmonary nodules.   ROV 10/07/21 --71 year old man with history of scoliosis and associated restrictive lung disease evaluated for progressive exertional dyspnea.  His pulmonary function testing shows restrictive spirometry and lung volumes.  No overt ILD on imaging, some mild cylindrical bronchiectatic change.  At his last visit in November we stopped Symbicort in absence of clear obstructive disease, encouraged incentive spirometry and exercise and conditioning. Today he reports that he has not exercised much, has intermittently used the IS.   He believes that his breathing is stable. He has still occasionally used Symbicort, uses prn for when hears any wheezing. He does not currently have any albuterol. No new issues. Occasional cough, prod of clear.   Review of Systems As per HPI  Past Medical History:  Diagnosis Date   Arthritis    "left knee" (01/29/2016)   Bradycardia, drug induced 10/26/2016   Bronchiolitis    CAD in native artery    a. Abnl cardiac CT ; b. LHC 01/2016 s/p 50% mLAD, 25% OM2, 95% RPDA which was treated  with overlapping DES; c. MV 03/2021 negative for ischemia   Chronic combined systolic and diastolic heart failure (HCC)    HFmrEF, echo 03/2021: EF 45-50%, GIDD.   Chronic knee pain     Chronic lower back pain    DOE (dyspnea on exertion)    Dyspnea    Frequent PVCs    a. 12/2015 - Holter showed SB, NSR, ST, HR 54-108, with frequent PVCs in singles, couplets, and bigemy, with elevated PVC load 24%.   GERD (gastroesophageal reflux disease)    History of shingles    Hyperlipidemia    Hypertension    Hypothyroidism    Prostate cancer ([place])    Scoliosis    Spinal stenosis    Spondylolisthesis of lumbar region    Tortuosity of aortic arch    Tortuosity of aortic arch, -would not pursue Rt radial approach in this patient for future cardiac caths      Family History  Problem Relation Age of Onset   Heart attack Father    Heart disease Father      Social History   Socioeconomic History   Marital status: Married    Spouse name: Not on file   Number of children: Not on file   Years of education: Not on file   Highest education level: Not on file  Occupational History   Not on file  Tobacco Use   Smoking status: Never   Smokeless tobacco: Never  Vaping Use   Vaping Use: Never used  Substance and Sexual Activity   Alcohol use: Yes    Comment: 01/29/2016 "glass of wine q couple months"   Drug use: No   Sexual activity: Not Currently  Other Topics Concern   Not on file  Social History Narrative   Not on file   Social Determinants of Health   Financial Resource Strain: Not on file  Food Insecurity: Not on file  Transportation Needs: Not on file  Physical Activity: Not on file  Stress: Not on file  Social Connections: Not on file  Intimate Partner Violence: Not on file    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   Allergies  Allergen Reactions   Topiramate Anxiety, Palpitations and Other (See Comments)    blurred vision, tired, ringing in ears, restlessness, confusion, rapid heart rate, made him jittery   Celecoxib Other (See Comments)    GI Upset; did not mix with his Nexium/Caused acid relux      Pitavastatin Rash and Cough    LIVALO    Rosuvastatin Calcium Other (See Comments)    Muscle aches    Hydrocodone-Acetaminophen Other (See Comments)    Delusions      Outpatient Medications Prior to Visit  Medication Sig Dispense Refill   acetaminophen (TYLENOL) 500 MG tablet Take 500 mg by mouth every 6 (six) hours as needed.     Albuterol Sulfate (PROAIR HFA IN) Inhale 2 puffs into the lungs every 6 (six) hours as needed (for wheezing or shortness of breath).      atorvastatin (LIPITOR) 40 MG tablet Take 1 tablet (40 mg total) by mouth daily at 6 PM. 30 tablet 6   budesonide-formoterol (SYMBICORT) 80-4.5 MCG/ACT inhaler Inhale 2 puffs into the lungs 2 (two) times daily as needed (for flares).      Cholecalciferol (VITAMIN D-3 PO) Take 1 capsule by mouth 2 (two) times a week.     clopidogrel (PLAVIX) 75 MG tablet TAKE 1  TABLET DAILY WITH BREAKFAST 90 tablet 1   Coenzyme Q10 (CO Q-10) 120 MG CAPS Take 120 mg by mouth 3 (three) times daily.     levothyroxine (SYNTHROID, LEVOTHROID) 125 MCG tablet Take 125 mcg by mouth daily before breakfast.     metoprolol succinate (TOPROL-XL) 50 MG 24 hr tablet Take 1 tablet (50 mg total) by mouth daily. Take with or immediately following a meal. 90 tablet 3   mexiletine (MEXITIL) 150 MG capsule Take 1 capsule (150 mg total) by mouth 2 (two) times daily. 180 capsule 3   Multiple Vitamins-Minerals (PRESERVISION AREDS 2 PO) Take 1 capsule by mouth 2 (two) times daily.     nitroGLYCERIN (NITROSTAT) 0.4 MG SL tablet Place 1 tablet (0.4 mg total) under the tongue every 5 (five) minutes as needed for chest pain (x 3 doses). 25 tablet 5   pantoprazole (PROTONIX) 40 MG tablet Take 1 tablet (40 mg total) by mouth daily. 90 tablet 3   prednisoLONE acetate (PRED FORTE) 1 % ophthalmic suspension Place 1 drop into the left eye 4 (four) times daily.     No facility-administered medications prior to visit.         Objective:   Physical Exam  Vitals:   10/07/21  0907  BP: 118/72  Pulse: 76  Temp: 97.6 F (36.4 C)  TempSrc: Oral  SpO2: 99%  Weight: 168 lb 6.4 oz (76.4 kg)  Height: 5\' 7"  (1.702 m)   Gen: Pleasant, well-nourished, in no distress,  normal affect  ENT: No lesions,  mouth clear,  oropharynx clear, no postnasal drip  Neck: No JVD, no stridor  Lungs: No use of accessory muscles, clear but better air movement heard on L compared to R. No crackles or wheezing on normal respiration, no wheeze on forced expiration  Cardiovascular: RRR, heart sounds normal, no murmur or gallops, no peripheral edema  Musculoskeletal: scoliosis evident.   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  No evidence of obstruction on his PFT although he does still sometimes use bronchodilators for intermittent wheezing.  He has been using Symbicort because is what he had available.  Not taking this as maintenance therapy.  We will discontinue it (again).  We will refill his albuterol, okay for him to use this intermittently for symptom relief.  I think you will benefit the most from working on his cardiopulmonary conditioning, incentive spirometry, etc.  Stop using Symbicort We will refill your albuterol.  You can use 2 puffs when you need it for wheezing, shortness of breath, cough. Use your incentive spirometer every day Try to increase your exercise, work on your conditioning and stamina.  This will help your overall breathing. Follow with Dr. Lamonte Sakai if needed for any new respiratory symptoms, progressive shortness of breath    Baltazar Apo, MD, PhD 10/07/2021, 9:18 AM [facility] [phone] or if no answer before 7:00PM call [phone] For any issues after 7:00PM please call eLink [phone]

## 2021-10-07 NOTE — Addendum Note (Signed)
Addended by: Gavin Potters R on: 10/07/2021 09:56 AM   Modules accepted: Orders

## 2021-11-02 ENCOUNTER — Other Ambulatory Visit: Payer: Self-pay | Admitting: Cardiology

## 2021-11-04 NOTE — Telephone Encounter (Signed)
Rx(s) sent to pharmacy electronically.  

## 2021-12-13 ENCOUNTER — Telehealth: Payer: Self-pay | Admitting: Cardiology

## 2021-12-13 ENCOUNTER — Ambulatory Visit: Payer: Medicare HMO | Admitting: Gastroenterology

## 2021-12-13 ENCOUNTER — Encounter: Payer: Self-pay | Admitting: Gastroenterology

## 2021-12-13 VITALS — BP 140/60 | HR 48 | Ht 64.25 in | Wt 170.1 lb

## 2021-12-13 DIAGNOSIS — K5909 Other constipation: Secondary | ICD-10-CM

## 2021-12-13 MED ORDER — LINACLOTIDE 72 MCG PO CAPS: 72.0000 ug | ORAL_CAPSULE | Freq: Every day | ORAL | 0 refills | Status: DC

## 2021-12-13 NOTE — Patient Instructions (Addendum)
If you are age 72 or older, your body mass index should be between 23-30. Your Body mass index is 28.98 kg/m?[name] If this is out of the aforementioned range listed, please consider follow up with your Primary Care Provider. ?________________________________________________________ ? ?The [facility] providers would like to encourage you to use [facility] to communicate with providers for non-urgent requests or questions.  Due to long hold times on the telephone, sending your provider a message by [facility] may be a faster and more efficient way to get a response.  Please allow 48 business hours for a response.  Please remember that this is for non-urgent requests.  ?_______________________________________________________ ? ?Decrease Linzess to 72 mcg 1 capsule before breakfast. ?*We have given you samples today. Please call the office, and speak with Dr. Loletha Carrow' nurse, Herbert Seta, to let us know if they work for you. ? ?Please contact your Primary care or Cardiologist to get set up for an EKG. ? ?Follow up as needed for now. ? ?It was a pleasure to see you today! ? ?Thank you for trusting me with your gastrointestinal care!   ? ? ?

## 2021-12-13 NOTE — Progress Notes (Signed)
? ? ? ?[facility] Progress Note ? ?Chief Complaint: Chronic constipation ? ?Subjective  ?History: ?Duane Reyes follows up for constipation.  He was initially seen in February of this year after previous care at her GI practice in [place].  He has severe chronic back pain with radiculopathy from scoliosis as well.  Low-dose MiraLAX was providing him some relief, and he had undergone colonoscopy the previous year by his report.  Records were requested from that practice but not received. ?He recently saw family medicine and had back x-rays and CT abdomen and pelvis. ? ? ?[name] was here with his wife today.  His symptoms of the same as before, with a left-sided mid to low back pain that radiates around to the left and feels like a burning and electric shock, yesterday was severe enough to double him over.  Since I last saw him he was taking MiraLAX capful twice daily and had a BM 1 or 2 times a day but it was loose.  Last week primary care gave him samples of Linzess 145 mcg, he took 1 dose last Friday after some magnesium citrate 2 days before that (causing significant output), and a dose yesterday.  It has made his stool loose. ? ?He is also been having episodes of lightheadedness as recently as this morning.  Pulse noted to be low and irregular on exam today. ? ?ROS: ?Cardiovascular:  no chest pain ?Respiratory: no dyspnea ?Chronic back pain ? ?The patient's Past Medical, Family and Social History were reviewed and are on file in the EMR. ? ?Objective: ? ?Med list reviewed ? ?Current Outpatient Medications:  ?  acetaminophen (TYLENOL) 500 MG tablet, Take 500 mg by mouth every 6 (six) hours as needed., Disp: , Rfl:  ?  albuterol (PROAIR HFA) 108 (90 Base) MCG/ACT inhaler, Inhale 2 puffs into the lungs every 6 (six) hours as needed (for wheezing or shortness of breath)., Disp: 18 g, Rfl: 6 ?  atorvastatin (LIPITOR) 40 MG tablet, Take 1 tablet (40 mg total) by mouth daily at 6 PM., Disp: 30 tablet, Rfl: 6 ?   baclofen (LIORESAL) 10 MG tablet, Take 10 mg by mouth 2 (two) times daily., Disp: , Rfl:  ?  budesonide-formoterol (SYMBICORT) 80-4.5 MCG/ACT inhaler, Inhale 2 puffs into the lungs 2 (two) times daily as needed (for flares). , Disp: , Rfl:  ?  Cholecalciferol (VITAMIN D-3 PO), Take 1 capsule by mouth 2 (two) times a week., Disp: , Rfl:  ?  clopidogrel (PLAVIX) 75 MG tablet, TAKE 1 TABLET DAILY WITH BREAKFAST, Disp: 90 tablet, Rfl: 1 ?  Coenzyme Q10 (CO Q-10) 120 MG CAPS, Take 120 mg by mouth 3 (three) times daily., Disp: , Rfl:  ?  diclofenac Sodium (VOLTAREN) 1 % GEL, Apply 1 application. topically. 3-4 times daily, Disp: , Rfl:  ?  levothyroxine (SYNTHROID, LEVOTHROID) 125 MCG tablet, Take 125 mcg by mouth daily before breakfast., Disp: , Rfl:  ?  LINZESS 145 MCG CAPS capsule, Take 145 mcg by mouth daily., Disp: , Rfl:  ?  metoprolol succinate (TOPROL-XL) 50 MG 24 hr tablet, Take 1 tablet (50 mg total) by mouth daily. Take with or immediately following a meal., Disp: 90 tablet, Rfl: 3 ?  mexiletine (MEXITIL) 150 MG capsule, TAKE ONE CAPSULE BY MOUTH TWICE A DAY, Disp: 180 capsule, Rfl: 2 ?  Multiple Vitamins-Minerals (PRESERVISION AREDS 2 PO), Take 1 capsule by mouth 2 (two) times daily., Disp: , Rfl:  ?  nitroGLYCERIN (NITROSTAT) 0.4 MG SL tablet, Place  1 tablet (0.4 mg total) under the tongue every 5 (five) minutes as needed for chest pain (x 3 doses)., Disp: 25 tablet, Rfl: 5 ?  pantoprazole (PROTONIX) 40 MG tablet, Take 1 tablet (40 mg total) by mouth daily., Disp: 90 tablet, Rfl: 3 ?  prednisoLONE acetate (PRED FORTE) 1 % ophthalmic suspension, Place 1 drop into the left eye 4 (four) times daily., Disp: , Rfl:  ? ? ?Vital signs in last 24 hrs: ?Vitals:  ? 12/13/21 1328  ?BP: 140/60  ?Pulse: (!) 48  ? ?Wt Readings from Last 3 Encounters:  ?12/13/21 170 lb 2 oz (77.2 kg)  ?10/07/21 168 lb 6.4 oz (76.4 kg)  ?08/09/21 179 lb 12.8 oz (81.6 kg)  ?  ?Pulse irregular and difficult to accurately measure by palpation  as a result, feels under 60 ?Physical Exam ? ?Slow, antalgic gait.  Gets on exam table independently.  Wife present for entire visit. ?HEENT: sclera anicteric, oral mucosa moist without lesions ?Neck: supple, no thyromegaly, JVD or lymphadenopathy ?Cardiac: RRR without murmurs, S1S2 heard, no peripheral edema ?Pulm: clear to auscultation bilaterally, normal RR and effort noted ?Abdomen: soft, no left-sided tenderness, with active bowel sounds. No guarding or palpable hepatosplenomegaly.  He has right-sided abdominal wall tenderness and protrusion of that side due to his severe scoliosis ?Skin; warm and dry, no jaundice or rash ? ?Labs: ? ? ?___________________________________________ ?Radiologic studies: ? ?CLINICAL HISTORY: Bowel obstruction suspected  ?LLQ abdominal pain  ?Abdominal pain, acute, nonlocalized  ?Abdominal distension  ? ?COMPARISON: 09/01/2020  ? ?TECHNIQUE: Multiple axial images through the abdomen and pelvis were performed after the administration of intravenous contrast. Coronal and sagittal reconstructions were obtained and reviewed.  ? ?Radiation dose reduction was utilized (automated exposure control, mA or kV adjustment based on patient size, or iterative image reconstruction).  ? ? ?ORAL CONTRAST:  Was also administered.  ? ?IV CONTRAST: Isovue 370 80 cc  ? ?FINDINGS:  ?LUNG BASES:  ?. No acute infiltrate or effusion.  ?. Mild atelectasis at the lung bases. Stable subpleural 5 mm right lower lobe nodule.  ?Small sliding hiatal hernia.  ?ABDOMEN AND PELVIS:  ?. Liver: Tiny cyst in the right lower lobe is stable. No suspicious mass.  ?. Gallbladder and Bile ducts: Within normal limits.  ?. Pancreas: Within normal limits.  ?. Spleen: Within normal limits.  ?. Adrenals: Within normal limits.  ?.  ?. GI tract: Oral contrast has progressed through nonobstructed small bowel to the colon.  Normal terminal ileum and appendix. Mild diverticulosis of the left colon, without evidence of acute  diverticulitis.  ?. Kidneys: Within normal limits.  ?. Ureters: Within normal limits.  ?. Urinary bladder: Within normal limits.  ?. Reproductive: Fiducial markers in the prostate.  ?.  ?. Vascular: Atherosclerotic changes of the aorta. No aneurysm.  ?.  ?. Peritoneum/Extraperitoneum: No free fluid or free air. No lymphadenopathy.  ? ?Small fat-containing left inguinal hernia. Moderate right neural hernia containing nonobstructed small bowel.  ?.  ?MUSCULOSKELETAL: No acute osseous abnormality. Moderate dextroscoliosis thoracolumbar junction.  ? ?Status post L4 S1 fusion. Hardware intact  ? ?____________________________________________ ?Other: ? ? ?_____________________________________________ ?Assessment & Plan  ?Assessment: ?Encounter Diagnosis  ?Name Primary?  ? Chronic constipation Yes  ? ?I still believe his LLQ pain is radicular coming from his severe scoliosis. ?He has right-sided abdominal wall muscle tenderness due to strain from his altered gait from severe scoliosis. ?Chronic idiopathic constipation, verapamil may have been contributing when I last saw him, but he  has since stopped that medicine.  He is also bradycardic and irregular today and probably symptomatic from what his wife is describing. ? ? ?Plan: ?I recommended they go by his primary care office or call today to be seen in the next day or 2 with an EKG. ? ?I do not think a repeat colonoscopy is warranted, we will try to get his previous colonoscopy report with a request for records. ? ?It may be difficult to achieve a regular formed bowel movement every day as is his hope and expectation.  He went taking low-dose MiraLAX to high-dose MiraLAX, so perhaps somewhere in the middle may be best.  I also gave him samples of Linzess 72 mcg once daily to try instead of the 145. ? ?They will let me know how that goes, and I can make further recommendations accordingly. ? ?23 minutes were spent on this encounter (including chart review, history/exam,  counseling/coordination of care, and documentation) > 50% of that time was spent on counseling and coordination of care.  ? ?Nelida Meuse III ? ?

## 2021-12-13 NOTE — Telephone Encounter (Signed)
Caryl Pina is calling stating they saw the pt today and his HR was 37. Patient was scheduled for first available and added to the wait list. Report they also performed two EKG's notes being faxed.  ?

## 2021-12-14 NOTE — Telephone Encounter (Signed)
EKGs received/reviewed by [name]. ?Pt will be scheduled to see EP PA on Thursday for overdue follow up. ?EKGs placed in Renee Ursuy's box. ?

## 2021-12-14 NOTE — Telephone Encounter (Signed)
Asked they fax again, but to [phone]. ?They will refax EKGs ?

## 2021-12-15 ENCOUNTER — Telehealth: Payer: Self-pay

## 2021-12-15 NOTE — Telephone Encounter (Signed)
Notes scanned to referral 

## 2021-12-15 NOTE — Progress Notes (Signed)
? ?Cardiology Office Note ?Date:  12/15/2021  ?Patient ID:  Duane Reyes, Duane Reyes 01-05-1951, MRN [id number] ?PCP:  [name], MD  ?Cardiologist:  [name] (2019, last gen cards visit) ?Electrophysiologist: [name] ? ?  ?Chief Complaint:  bradycardia ? ?History of Present Illness: ?Duane Reyes is a 71 y.o. male with history of CAD ([place] 01/2016 and he was found to have 50% mid LAD, 25% OM2, 95% RPDA s/p PCI), SOB > abnormal PFTs >> seen by Dr. Malvin Johns. He reports he was felt to have restricted breathing secondary to scoliosis. No COPD, PVCs, hypothyroidism, HTN, HLD ? ?He comes today to be seen for [name], he last saw [name] Dec 2020.  He was feeling well, no perceived PVCs/palpitations, able to do all of his ADLs.  His mother had passed away and had some family issues, though physically feeling well. Mentioned trying to avoid amio given baseline hypothyroidism. ? ?I saw him Oct 2021 ?He is doing well.  He has at baseline some DOE for years that has been felt 2/2 his scoliosis/mechanical issue.   ?He is pending an epidural, mentions that he had back surgery a few years back and is starting to have some pain again. ?He remains quite active though, walks 3-4 days a week for about 2 minutes at a brisk pace with no changes in his exertional capacity. ?No CP, rarely feels a brief skip in his beat. ?No dizzy spells, near syncope or syncope. ?He does most of the care for his property, remains physically active, yesterday changed out a toilet at the curch and started on some of his own pluming issues.   ?Noted PVCs on his EKG, planned for repeat monitoring to assess burden and perhaps titrate mexiletine ?Felt to be an acceptable risk for epidural planned ? ?PVC burden only 2.6% ? ?He has seen A. Tillery, PA-C since a couple times, with c/o some SOB, TTE noted LVEF reduced 45-50% his CCB changed to BB and planned for myoview which was low risk, no ischemic, normal study with LVEF 45-54% > 50% ?He last  saw [name]  04/23/21, c/w SOB, with hx of restrictive lung disease, was lost to f/u with pulmonary since 2019 ?Not felt to be volume OL, discussed adding spironolactone and advancing GDMT thiugh the pt declined.  Discussed perhaps losartan at his next visit if pt agreeable ? ?He saw [name], NP 08/09/21 for pre-op evaluation prior to epidural steroid injection, no new complaints or symptoms, c/w mild chronic SOB.  Revisited GDMT consideration for ARB or more selective BB given some pulm disease he declined any changes. ?Felt an acceptable risk for planned procedure, ok to hold plavix pre-procedure. ? ?He was seen at his GI office 12/13/21 for chronic constipation, found to have an irregular, slow HR of 37 and referred to his PMD, he was seen same day EKG reported to have PVCs noted with HR 60's at their office. ?Unclear what his symptoms were the day prior that the pt or his wife reported, was described as asymptomatic tat the PMD office ?I don't see any changes mad to his meds. ? ? ?TODAY ?He is accompanied by his wife. ?He mentions that he gets short winded and had poor exertional tolerance now for a "good long while", this is unchanged. ?IN the last 37moprobably he has had a sense intermittently of feeling a little off balance, perhaps a little lightheaded. ?In the last 3 weeks this has become a more persistent symptoms. ?Infrequently  feels a light flutter in his heart beat ? ?No near syncope or syncope ?No CP ? ?Initial EKG done is SR with no PVCs, though his wife says they never happen when he is supine and requested another EKG be done seated. ?While waiting for the MA by auscultation seated he was not having PVCs ?The seated EKG SR one PVC ?Rhythm strips with only a couple PVCs, no bradycardia ? ? ?PVCs ?holter 2018 noted 46% PVCs ?AAD Hx ?Mexiletine started Nov 2018 is current ?Historically reported to be intolerant of betablockers 2/2 severe fatigue and bradycardia ? ?Past Medical History:  ?Diagnosis Date   ? Arthritis   ? "left knee" (01/29/2016)  ? 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 Bronchiolitis   ? CAD in native artery   ? a. Abnl cardiac CT ; b. LHC 01/2016 s/p 50% mLAD, 25% OM2, 95% RPDA which was treated with overlapping DES; c. MV 03/2021 negative for ischemia  ? Chronic combined systolic and diastolic heart failure ([place])   ? HFmrEF, echo 03/2021: EF 45-50%, GIDD.  ? Chronic knee pain   ? Chronic lower back pain   ? DOE (dyspnea on exertion)   ? Dyspnea   ? Frequent PVCs   ? a. 12/2015 - Holter showed SB, NSR, ST, HR 54-108, with frequent PVCs in singles, couplets, and bigemy, with elevated PVC load 24%.  ? GERD (gastroesophageal reflux disease)   ? History of shingles   ? Hyperlipidemia   ? Hypertension   ? Hypothyroidism   ? Prostate cancer ([place])   ? Scoliosis   ? Spinal stenosis   ? Spondylolisthesis of lumbar region   ? Tortuosity of aortic arch   ? Tortuosity of aortic arch, -would not pursue Rt radial approach in this patient for future cardiac caths   ? ? ?Past Surgical History:  ?Procedure Laterality Date  ? ANGIOPLASTY    ? BACK SURGERY    ? CARDIAC CATHETERIZATION N/A 01/29/2016  ? Procedure: Right/Left Heart Cath and Coronary Angiography;  Surgeon: Jettie Booze, MD;  Location: [facility];  Service: Cardiovascular;  Laterality: N/A;  ? CARDIAC CATHETERIZATION N/A 01/29/2016  ? Procedure: Coronary Stent Intervention 2.5/16mm Synergy-proximal PDA;  Surgeon: Jettie Booze, MD;  Location: [facility];  Service: Cardiovascular;  Laterality: N/A;  ? CATARACT EXTRACTION W/ INTRAOCULAR LENS IMPLANT Left 11/2015  ? COLONOSCOPY    ? CORONARY ANGIOPLASTY WITH STENT PLACEMENT  01/29/2016  ? "2 stents"  ? CYST EXCISION Right 2005  ? "near bicep"  ? ESOPHAGOGASTRODUODENOSCOPY    ? EYE SURGERY    ? INGUINAL HERNIA REPAIR    ? JOINT REPLACEMENT    ? KNEE ARTHROSCOPY Left 2012  ? "torn meniscus"  ? POSTERIOR LUMBAR FUSION  02/2013  ? "L4-5; hardware in place"  ? PROSTATE BIOPSY  2013  ? PROSTATE  SURGERY  2013  ? "brachytherapy for prostate radiation"  ? TONSILLECTOMY  1950s  ? TOTAL KNEE ARTHROPLASTY Left 12/18/2017  ? TOTAL KNEE ARTHROPLASTY Left 12/18/2017  ? Procedure: TOTAL KNEE ARTHROPLASTY;  Surgeon: Vickey Huger, MD;  Location: [facility];  Service: Orthopedics;  Laterality: Left;  ? ? ?Current Outpatient Medications  ?Medication Sig Dispense Refill  ? acetaminophen (TYLENOL) 500 MG tablet Take 500 mg by mouth every 6 (six) hours as needed.    ? albuterol (PROAIR HFA) 108 (90 Base) MCG/ACT inhaler Inhale 2 puffs into the lungs every 6 (six) hours as needed (for wheezing or shortness of breath). [place]  g 6  ? atorvastatin (LIPITOR) 40 MG tablet Take 1 tablet (40 mg total) by mouth daily at 6 PM. 30 tablet 6  ? baclofen (LIORESAL) 10 MG tablet Take 10 mg by mouth 2 (two) times daily.    ? budesonide-formoterol (SYMBICORT) 80-4.5 MCG/ACT inhaler Inhale 2 puffs into the lungs 2 (two) times daily as needed (for flares).     ? Cholecalciferol (VITAMIN D-3 PO) Take 1 capsule by mouth 2 (two) times a week.    ? clopidogrel (PLAVIX) 75 MG tablet TAKE 1 TABLET DAILY WITH BREAKFAST 90 tablet 1  ? Coenzyme Q10 (CO Q-10) 120 MG CAPS Take 120 mg by mouth 3 (three) times daily.    ? diclofenac Sodium (VOLTAREN) 1 % GEL Apply 1 application. topically. 3-4 times daily    ? levothyroxine (SYNTHROID, LEVOTHROID) 125 MCG tablet Take 125 mcg by mouth daily before breakfast.    ? linaclotide (LINZESS) 72 MCG capsule Take 1 capsule (72 mcg total) by mouth daily before breakfast. 16 capsule 0  ? metoprolol succinate (TOPROL-XL) 50 MG 24 hr tablet Take 1 tablet (50 mg total) by mouth daily. Take with or immediately following a meal. 90 tablet 3  ? mexiletine (MEXITIL) 150 MG capsule TAKE ONE CAPSULE BY MOUTH TWICE A DAY 180 capsule 2  ? Multiple Vitamins-Minerals (PRESERVISION AREDS 2 PO) Take 1 capsule by mouth 2 (two) times daily.    ? nitroGLYCERIN (NITROSTAT) 0.4 MG SL tablet Place 1 tablet (0.4 mg total) under the tongue every 5  (five) minutes as needed for chest pain (x 3 doses). 25 tablet 5  ? pantoprazole (PROTONIX) 40 MG tablet Take 1 tablet (40 mg total) by mouth daily. 90 tablet 3  ? prednisoLONE acetate (PRED FORTE) 1 %

## 2021-12-16 ENCOUNTER — Ambulatory Visit (INDEPENDENT_AMBULATORY_CARE_PROVIDER_SITE_OTHER): Payer: Medicare HMO

## 2021-12-16 ENCOUNTER — Ambulatory Visit: Payer: Medicare HMO | Admitting: Physician Assistant

## 2021-12-16 ENCOUNTER — Other Ambulatory Visit: Payer: Self-pay | Admitting: *Deleted

## 2021-12-16 ENCOUNTER — Ambulatory Visit: Payer: Medicare HMO

## 2021-12-16 ENCOUNTER — Encounter: Payer: Self-pay | Admitting: Physician Assistant

## 2021-12-16 VITALS — BP 122/78 | HR 75 | Ht 64.25 in | Wt 172.4 lb

## 2021-12-16 DIAGNOSIS — R42 Dizziness and giddiness: Secondary | ICD-10-CM

## 2021-12-16 DIAGNOSIS — I493 Ventricular premature depolarization: Secondary | ICD-10-CM

## 2021-12-16 DIAGNOSIS — R001 Bradycardia, unspecified: Secondary | ICD-10-CM

## 2021-12-16 DIAGNOSIS — I10 Essential (primary) hypertension: Secondary | ICD-10-CM

## 2021-12-16 DIAGNOSIS — I2510 Atherosclerotic heart disease of native coronary artery without angina pectoris: Secondary | ICD-10-CM | POA: Diagnosis not present

## 2021-12-16 NOTE — Progress Notes (Unsigned)
[id number] ZIO AT from office inventory applied in office. ?

## 2021-12-16 NOTE — Progress Notes (Unsigned)
[id number] ZIO AT from office inventory applied to patient. ? ?[name] to read. ?

## 2021-12-16 NOTE — Patient Instructions (Addendum)
Medication Instructions:  ? ?Your physician recommends that you continue on your current medications as directed. Please refer to the Current Medication list given to you today. ? ?*If you need a refill on your cardiac medications before your next appointment, please call your pharmacy* ? ? ?Lab Work: [place] ? ? ? ?If you have labs (blood work) drawn today and your tests are completely normal, you will receive your results only by: ?MyChart Message (if you have MyChart) OR ?A paper copy in the mail ?If you have any lab test that is abnormal or we need to change your treatment, we will call you to review the results. ? ? ?Testing/Procedures: Your physician has recommended that you wear an event monitor. Event monitors are medical devices that record the heart?s electrical activity. Doctors most often [place] these monitors to diagnose arrhythmias. Arrhythmias are problems with the speed or rhythm of the heartbeat. The monitor is a small, portable device. You can wear one while you do your normal daily activities. This is usually used to diagnose what is causing palpitations/syncope (passing out). ? ? ? ?Follow-Up: ?At [facility], you and your health needs are our priority.  As part of our continuing mission to provide you with exceptional heart care, we have created designated Provider Care Teams.  These Care Teams include your primary Cardiologist (physician) and Advanced Practice Providers (APPs -  Physician Assistants and Nurse Practitioners) who all work together to provide you with the care you need, when you need it. ? ?We recommend signing up for the patient portal called "MyChart".  Sign up information is provided on this After Visit Summary.  MyChart is used to connect with patients for Virtual Visits (Telemedicine).  Patients are able to view lab/test results, encounter notes, upcoming appointments, etc.  Non-urgent messages can be sent to your provider as well.   ?To learn more about what you  can do with MyChart, go to [email or web address].   ? ?Your next appointment:   ?3 -4 week(s) ? ?The format for your next appointment:   ?In Person ? ?Provider:   ?Tommye Standard, PA-C or Lollie Marrow, PA-C{ ? ? ?Other Instructions ? ? ?Important Information About [place] ? ? ? ? ?  ?

## 2021-12-17 ENCOUNTER — Telehealth: Payer: Self-pay

## 2021-12-17 NOTE — Telephone Encounter (Signed)
NOTES SCANNED TO REFERRAL 

## 2021-12-20 ENCOUNTER — Telehealth: Payer: Self-pay | Admitting: Physician Assistant

## 2021-12-20 NOTE — Telephone Encounter (Signed)
Patient wanted to know if the office had enough data from his heart monitor and he could take it off. Please advise  ?

## 2021-12-20 NOTE — Telephone Encounter (Signed)
Spoke with patient and verbalized understanding that Per instruction to wear 14 days and will talk to provider Renee to further see if it can come off now. ?

## 2021-12-22 NOTE — Telephone Encounter (Signed)
Spoke with patient and patient verbalized understanding to wear monitor full term.  ?

## 2021-12-31 NOTE — Progress Notes (Signed)
? ?PCP:  [name], MD ?Primary Cardiologist: Fransico Him, MD ?Electrophysiologist: [name], MD  ? ?Duane Reyes is a 71 y.o. male seen today for [name], MD for routine electrophysiology followup.  Since last being seen in our clinic the patient reports doing well. He has not had any further episodes of dizziness.  he denies chest pain, palpitations, dyspnea, PND, orthopnea, nausea, vomiting, dizziness, syncope, edema, weight gain, or early satiety. ? ?Past Medical History:  ?Diagnosis Date  ? Arthritis   ? "left knee" (01/29/2016)  ? 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 Bronchiolitis   ? CAD in native artery   ? a. Abnl cardiac CT ; b. LHC 01/2016 s/p 50% mLAD, 25% OM2, 95% RPDA which was treated with overlapping DES; c. MV 03/2021 negative for ischemia  ? Chronic combined systolic and diastolic heart failure ([place])   ? HFmrEF, echo 03/2021: EF 45-50%, GIDD.  ? Chronic knee pain   ? Chronic lower back pain   ? DOE (dyspnea on exertion)   ? Dyspnea   ? Frequent PVCs   ? a. 12/2015 - Holter showed SB, NSR, ST, HR 54-108, with frequent PVCs in singles, couplets, and bigemy, with elevated PVC load 24%.  ? GERD (gastroesophageal reflux disease)   ? History of shingles   ? Hyperlipidemia   ? Hypertension   ? Hypothyroidism   ? Prostate cancer ([place])   ? Scoliosis   ? Spinal stenosis   ? Spondylolisthesis of lumbar region   ? Tortuosity of aortic arch   ? Tortuosity of aortic arch, -would not pursue Rt radial approach in this patient for future cardiac caths   ? ?Past Surgical History:  ?Procedure Laterality Date  ? ANGIOPLASTY    ? BACK SURGERY    ? CARDIAC CATHETERIZATION N/A 01/29/2016  ? Procedure: Right/Left Heart Cath and Coronary Angiography;  Surgeon: Jettie Booze, MD;  Location: [facility];  Service: Cardiovascular;  Laterality: N/A;  ? CARDIAC CATHETERIZATION N/A 01/29/2016  ? Procedure: Coronary Stent Intervention 2.5/16mm Synergy-proximal PDA;  Surgeon: Jettie Booze, MD;  Location: [facility];  Service: Cardiovascular;  Laterality: N/A;  ? CATARACT EXTRACTION W/ INTRAOCULAR LENS IMPLANT Left 11/2015  ? COLONOSCOPY    ? CORONARY ANGIOPLASTY WITH STENT PLACEMENT  01/29/2016  ? "2 stents"  ? CYST EXCISION Right 2005  ? "near bicep"  ? ESOPHAGOGASTRODUODENOSCOPY    ? EYE SURGERY    ? INGUINAL HERNIA REPAIR    ? JOINT REPLACEMENT    ? KNEE ARTHROSCOPY Left 2012  ? "torn meniscus"  ? POSTERIOR LUMBAR FUSION  02/2013  ? "L4-5; hardware in place"  ? PROSTATE BIOPSY  2013  ? PROSTATE SURGERY  2013  ? "brachytherapy for prostate radiation"  ? TONSILLECTOMY  1950s  ? TOTAL KNEE ARTHROPLASTY Left 12/18/2017  ? TOTAL KNEE ARTHROPLASTY Left 12/18/2017  ? Procedure: TOTAL KNEE ARTHROPLASTY;  Surgeon: Vickey Huger, MD;  Location: [facility];  Service: Orthopedics;  Laterality: Left;  ? ? ?Current Outpatient Medications  ?Medication Sig Dispense Refill  ? acetaminophen (TYLENOL) 500 MG tablet Take 500 mg by mouth every 6 (six) hours as needed.    ? albuterol (PROAIR HFA) 108 (90 Base) MCG/ACT inhaler Inhale 2 puffs into the lungs every 6 (six) hours as needed (for wheezing or shortness of breath). 18 g 6  ? atorvastatin (LIPITOR) 40 MG tablet Take 1 tablet (40 mg total) by mouth daily at 6 PM. 30  tablet 6  ? baclofen (LIORESAL) 10 MG tablet Take 10 mg by mouth 2 (two) times daily.    ? budesonide-formoterol (SYMBICORT) 80-4.5 MCG/ACT inhaler Inhale 2 puffs into the lungs 2 (two) times daily as needed (for flares).     ? Cholecalciferol (VITAMIN D-3 PO) Take 1 capsule by mouth 2 (two) times a week.    ? clopidogrel (PLAVIX) 75 MG tablet TAKE 1 TABLET DAILY WITH BREAKFAST 90 tablet 1  ? Coenzyme Q10 (CO Q-10) 120 MG CAPS Take 120 mg by mouth 3 (three) times daily.    ? diclofenac Sodium (VOLTAREN) 1 % GEL Apply 1 application. topically. 3-4 times daily    ? levothyroxine (SYNTHROID, LEVOTHROID) 125 MCG tablet Take 125 mcg by mouth daily before breakfast.    ? linaclotide (LINZESS) 72 MCG  capsule Take 1 capsule (72 mcg total) by mouth daily before breakfast. (Patient taking differently: Take 72 mcg by mouth as needed.) 16 capsule 0  ? mexiletine (MEXITIL) 150 MG capsule TAKE ONE CAPSULE BY MOUTH TWICE A DAY 180 capsule 2  ? Multiple Vitamins-Minerals (PRESERVISION AREDS 2 PO) Take 1 capsule by mouth 2 (two) times daily.    ? nitroGLYCERIN (NITROSTAT) 0.4 MG SL tablet Place 1 tablet (0.4 mg total) under the tongue every 5 (five) minutes as needed for chest pain (x 3 doses). 25 tablet 5  ? pantoprazole (PROTONIX) 40 MG tablet Take 1 tablet (40 mg total) by mouth daily. 90 tablet 3  ? prednisoLONE acetate (PRED FORTE) 1 % ophthalmic suspension Place 1 drop into the left eye 4 (four) times daily.    ? metoprolol succinate (TOPROL-XL) 50 MG 24 hr tablet Take 1 tablet (50 mg total) by mouth daily. Take with or immediately following a meal. 90 tablet 3  ? ?No current facility-administered medications for this visit.  ? ? ?Allergies  ?Allergen Reactions  ? Topiramate Anxiety, Palpitations and Other (See Comments)  ?  blurred vision, tired, ringing in ears, restlessness, confusion, rapid heart rate, made him jittery  ? Celecoxib Other (See Comments)  ?  GI Upset; did not mix with his Nexium/Caused acid relux  ?  ? Pitavastatin Rash and Cough  ?  LIVALO ?  ? Rosuvastatin Calcium Other (See Comments)  ?  Muscle aches ?  ? Hydrocodone-Acetaminophen Other (See Comments)  ?  Delusions   ? ? ?Social History  ? ?Socioeconomic History  ? Marital status: Married  ?  Spouse name: Not on file  ? Number of children: Not on file  ? Years of education: Not on file  ? Highest education level: Not on file  ?Occupational History  ? Not on file  ?Tobacco Use  ? Smoking status: Never  ? Smokeless tobacco: Never  ?Vaping Use  ? Vaping Use: Never used  ?Substance and Sexual Activity  ? Alcohol use: Yes  ?  Comment: 01/29/2016 "glass of wine q couple months"  ? Drug use: No  ? Sexual activity: Not Currently  ?Other Topics Concern  ?  Not on file  ?Social History Narrative  ? Not on file  ? ?Social Determinants of Health  ? ?Financial Resource Strain: Not on file  ?Food Insecurity: Not on file  ?Transportation Needs: Not on file  ?Physical Activity: Not on file  ?Stress: Not on file  ?Social Connections: Not on file  ?Intimate Partner Violence: Not on file  ? ? ? ?Review of Systems: ?All other systems reviewed and are otherwise negative except as noted above. ? ?  Physical Exam: ?Vitals:  ? 01/07/22 0956  ?BP: 112/68  ?Pulse: 76  ?SpO2: 100%  ?Weight: 174 lb (78.9 kg)  ?Height: '5\' 3"'$  (1.6 m)  ? ? ?GEN- The patient is well appearing, alert and oriented x 3 today.   ?HEENT: normocephalic, atraumatic; sclera clear, conjunctiva pink; hearing intact; oropharynx clear; neck supple, no JVP ?Lymph- no cervical lymphadenopathy ?Lungs- Clear to ausculation bilaterally, normal work of breathing.  No wheezes, rales, rhonchi ?Heart- Regular rate and rhythm, no murmurs, rubs or gallops, PMI not laterally displaced ?GI- soft, non-tender, non-distended, bowel sounds present, no hepatosplenomegaly ?Extremities- no clubbing, cyanosis, or edema; DP/PT/radial pulses 2+ bilaterally ?MS- no significant deformity or atrophy ?Skin- warm and dry, no rash or lesion ?Psych- euthymic mood, full affect ?Neuro- strength and sensation are intact ? ?EKG is not ordered.  ? ?Additional studies reviewed include: ?Previous EP office notes.  ? ?Monitor 12/2021 ?Predominant rhythm was sinus rhythm ?Less than 1% supraventricular ectopy ?22.8% ventricular ectopy ?No triggered episodes or symptoms noted ? ?Assessment and Plan: ? ?1. CAD ?PCI in 2017 ?Denies anginal symptoms ?Cont clopidogrel, statin, BB ?  ?2. PVCs ?Monitor with 22.8% burden  ?Increase mexitil to 250 mg BID.  ?May need to eventually consider ablation.  ?  ?3. HTN ?Stable on current regimen  ?  ?4. HLD ?Monitored and managed with his PMD ?  ?5. Bradycardia ?Some degree of lightheadedness, unsteady gait ?Monitor showed sinus  rhythm and no significant bradycardia.  ?  ? ?Follow up with [name] in 3 months  ? ?Shirley Friar, PA-C  ?01/07/22 ?10:04 AM  ?

## 2022-01-07 ENCOUNTER — Ambulatory Visit: Payer: Medicare HMO | Admitting: Student

## 2022-01-07 ENCOUNTER — Encounter: Payer: Self-pay | Admitting: Student

## 2022-01-07 VITALS — BP 112/68 | HR 76 | Ht 63.0 in | Wt 174.0 lb

## 2022-01-07 DIAGNOSIS — R001 Bradycardia, unspecified: Secondary | ICD-10-CM | POA: Diagnosis not present

## 2022-01-07 DIAGNOSIS — R42 Dizziness and giddiness: Secondary | ICD-10-CM | POA: Diagnosis not present

## 2022-01-07 DIAGNOSIS — I10 Essential (primary) hypertension: Secondary | ICD-10-CM

## 2022-01-07 DIAGNOSIS — I2510 Atherosclerotic heart disease of native coronary artery without angina pectoris: Secondary | ICD-10-CM

## 2022-01-07 DIAGNOSIS — I493 Ventricular premature depolarization: Secondary | ICD-10-CM

## 2022-01-07 MED ORDER — MEXILETINE HCL 250 MG PO CAPS: 250.0000 mg | ORAL_CAPSULE | Freq: Two times a day (BID) | ORAL | 3 refills | Status: DC

## 2022-01-07 NOTE — Patient Instructions (Signed)
Medication Instructions:  ?Your physician has recommended you make the following change in your medication:  ? ?INCREASE: Mexilitine to '250mg'$  twice daily ? ?*If you need a refill on your cardiac medications before your next appointment, please call your pharmacy* ? ? ?Lab Work: ?None ?If you have labs (blood work) drawn today and your tests are completely normal, you will receive your results only by: ?MyChart Message (if you have MyChart) OR ?A paper copy in the mail ?If you have any lab test that is abnormal or we need to change your treatment, we will call you to review the results. ? ? ?Follow-Up: ?At [facility], you and your health needs are our priority.  As part of our continuing mission to provide you with exceptional heart care, we have created designated Provider Care Teams.  These Care Teams include your primary Cardiologist (physician) and Advanced Practice Providers (APPs -  Physician Assistants and Nurse Practitioners) who all work together to provide you with the care you need, when you need it. ? ?Your next appointment:   ?3 month(s) ? ?The format for your next appointment:   ?In Person ? ?Provider:   ?Allegra Lai, MD{ ?  ?

## 2022-01-17 ENCOUNTER — Other Ambulatory Visit: Payer: Self-pay | Admitting: *Deleted

## 2022-01-17 MED ORDER — MEXILETINE HCL 150 MG PO CAPS: 300.0000 mg | ORAL_CAPSULE | Freq: Two times a day (BID) | ORAL | 6 refills | Status: DC

## 2022-01-18 ENCOUNTER — Encounter (INDEPENDENT_AMBULATORY_CARE_PROVIDER_SITE_OTHER): Payer: Medicare HMO | Admitting: Ophthalmology

## 2022-01-18 DIAGNOSIS — H35033 Hypertensive retinopathy, bilateral: Secondary | ICD-10-CM

## 2022-01-18 DIAGNOSIS — H59032 Cystoid macular edema following cataract surgery, left eye: Secondary | ICD-10-CM

## 2022-01-18 DIAGNOSIS — H2511 Age-related nuclear cataract, right eye: Secondary | ICD-10-CM

## 2022-01-18 DIAGNOSIS — I10 Essential (primary) hypertension: Secondary | ICD-10-CM | POA: Diagnosis not present

## 2022-01-18 DIAGNOSIS — H35373 Puckering of macula, bilateral: Secondary | ICD-10-CM

## 2022-01-18 DIAGNOSIS — H353132 Nonexudative age-related macular degeneration, bilateral, intermediate dry stage: Secondary | ICD-10-CM

## 2022-01-18 DIAGNOSIS — H43811 Vitreous degeneration, right eye: Secondary | ICD-10-CM

## 2022-01-21 ENCOUNTER — Other Ambulatory Visit: Payer: Self-pay | Admitting: *Deleted

## 2022-01-21 ENCOUNTER — Telehealth: Payer: Self-pay | Admitting: *Deleted

## 2022-01-21 MED ORDER — MEXILETINE HCL 150 MG PO CAPS: 300.0000 mg | ORAL_CAPSULE | Freq: Three times a day (TID) | ORAL | 1 refills | Status: DC

## 2022-01-21 MED ORDER — MEXILETINE HCL 150 MG PO CAPS: 300.0000 mg | ORAL_CAPSULE | Freq: Two times a day (BID) | ORAL | 1 refills | Status: DC

## 2022-01-21 NOTE — Telephone Encounter (Signed)
Patient is returning call.  °

## 2022-01-21 NOTE — Telephone Encounter (Signed)
-----   Message from [facility], [place] sent at 01/17/2022 12:17 PM EDT ----- [name] has reviewed, please increase his mexiletine to '300mg'$  BID, keep f/u in place in August as schedule.  Thanks

## 2022-01-21 NOTE — Telephone Encounter (Signed)
Lvm to patient  to discuss results due to not reading Mychart message sent. Patient was left with office direct number to me [phone]

## 2022-01-25 ENCOUNTER — Other Ambulatory Visit: Payer: Self-pay | Admitting: *Deleted

## 2022-01-25 MED ORDER — MEXILETINE HCL 150 MG PO CAPS: 300.0000 mg | ORAL_CAPSULE | Freq: Two times a day (BID) | ORAL | 1 refills | Status: DC

## 2022-01-25 NOTE — Addendum Note (Signed)
Addended by: Claude Manges on: 01/25/2022 02:35 PM   Modules accepted: Orders

## 2022-02-10 ENCOUNTER — Other Ambulatory Visit: Payer: Self-pay

## 2022-02-10 MED ORDER — NITROGLYCERIN 0.4 MG SL SUBL
0.4000 mg | SUBLINGUAL_TABLET | SUBLINGUAL | 0 refills | Status: AC | PRN
Start: 2022-02-10 — End: ?

## 2022-02-10 NOTE — Telephone Encounter (Signed)
Pt's medication was sent to pt's pharmacy as requested. Confirmation received.  °

## 2022-02-19 ENCOUNTER — Emergency Department (HOSPITAL_BASED_OUTPATIENT_CLINIC_OR_DEPARTMENT_OTHER)
Admission: EM | Admit: 2022-02-19 | Discharge: 2022-02-19 | Disposition: A | Discharge status: Home / Self Care | Payer: Medicare HMO | Attending: Emergency Medicine | Admitting: Emergency Medicine

## 2022-02-19 ENCOUNTER — Other Ambulatory Visit: Payer: Self-pay

## 2022-02-19 ENCOUNTER — Emergency Department (HOSPITAL_BASED_OUTPATIENT_CLINIC_OR_DEPARTMENT_OTHER): Payer: Medicare HMO

## 2022-02-19 ENCOUNTER — Encounter (HOSPITAL_BASED_OUTPATIENT_CLINIC_OR_DEPARTMENT_OTHER): Payer: Self-pay | Admitting: Emergency Medicine

## 2022-02-19 DIAGNOSIS — M542 Cervicalgia: Secondary | ICD-10-CM

## 2022-02-19 DIAGNOSIS — Z8546 Personal history of malignant neoplasm of prostate: Secondary | ICD-10-CM | POA: Diagnosis not present

## 2022-02-19 MED ORDER — CYCLOBENZAPRINE HCL 10 MG PO TABS: 5.0000 mg | ORAL_TABLET | Freq: Two times a day (BID) | ORAL | 0 refills | Status: DC | PRN

## 2022-02-19 MED ORDER — CYCLOBENZAPRINE HCL 5 MG PO TABS
5.0000 mg | ORAL_TABLET | Freq: Once | ORAL | Status: AC
  Administered 2022-02-19: 5 mg via ORAL
  Filled 2022-02-19: qty 1

## 2022-04-13 ENCOUNTER — Ambulatory Visit: Payer: Medicare HMO | Admitting: Cardiology

## 2022-04-13 ENCOUNTER — Encounter: Payer: Self-pay | Admitting: Cardiology

## 2022-04-13 VITALS — BP 136/88 | HR 86 | Ht 67.0 in | Wt 169.8 lb

## 2022-04-13 DIAGNOSIS — I493 Ventricular premature depolarization: Secondary | ICD-10-CM

## 2022-04-13 NOTE — Progress Notes (Signed)
Electrophysiology Office Note   Date:  04/13/2022   ID:  HELMUT HENNON, DOB 1951/04/14, MRN [id number]  PCP:  [name], MD  Cardiologist:  [name] Primary Electrophysiologist:  Constance Haw, MD    No chief complaint on file.    History of Present Illness: Duane Reyes is a 71 y.o. male who is being seen today for the evaluation of PVCs at the request of [name], MD. Presenting today for electrophysiology evaluation.   He has a history seen for coronary artery disease with CTA showing significant calcification.  Left heart catheterization in 2017 showed significant coronary artery disease with a 95% PDA post PCI.  He has a history of PVCs.  He was initially on Cardizem but this was discontinued due to fatigue and bradycardia.  He wore a cardiac monitor that showed a 46% PVC burden.  He is now on mexiletine with an improvement in his PVCs.  Today, denies symptoms of palpitations, chest pain, shortness of breath, orthopnea, PND, lower extremity edema, claudication, dizziness, presyncope, syncope, bleeding, or neurologic sequela. The patient is tolerating medications without difficulties.  Since being seen he has done well.  He has had no chest pain or shortness of breath.  He is able to all of his daily activities without restriction.  He is unaware of palpitations or PVCs.  His main complaint is left leg pain.   Past Medical History:  Diagnosis Date   Arthritis    "left knee" (01/29/2016)   Bradycardia, drug induced 10/26/2016   Bronchiolitis    CAD in native artery    a. Abnl cardiac CT ; b. LHC 01/2016 s/p 50% mLAD, 25% OM2, 95% RPDA which was treated with overlapping DES; c. MV 03/2021 negative for ischemia   Chronic combined systolic and diastolic heart failure (HCC)    HFmrEF, echo 03/2021: EF 45-50%, GIDD.   Chronic knee pain    Chronic lower back pain    DOE (dyspnea on exertion)    Dyspnea    Frequent PVCs    a. 12/2015 - Holter showed SB, NSR,  ST, HR 54-108, with frequent PVCs in singles, couplets, and bigemy, with elevated PVC load 24%.   GERD (gastroesophageal reflux disease)    History of shingles    Hyperlipidemia    Hypertension    Hypothyroidism    Prostate cancer ([place])    Scoliosis    Spinal stenosis    Spondylolisthesis of lumbar region    Tortuosity of aortic arch    Tortuosity of aortic arch, -would not pursue Rt radial approach in this patient for future cardiac caths    Past Surgical History:  Procedure Laterality Date   ANGIOPLASTY     BACK SURGERY     CARDIAC CATHETERIZATION N/A 01/29/2016   Procedure: Right/Left Heart Cath and Coronary Angiography;  Surgeon: Jettie Booze, MD;  Location: [place] [facility];  Service: Cardiovascular;  Laterality: N/A;   CARDIAC CATHETERIZATION N/A 01/29/2016   Procedure: Coronary Stent Intervention 2.5/16mm Synergy-proximal PDA;  Surgeon: Jettie Booze, MD;  Location: [facility];  Service: Cardiovascular;  Laterality: N/A;   CATARACT EXTRACTION W/ INTRAOCULAR LENS IMPLANT Left 11/2015   COLONOSCOPY     CORONARY ANGIOPLASTY WITH STENT PLACEMENT  01/29/2016   "2 stents"   CYST EXCISION Right 2005   "near bicep"   ESOPHAGOGASTRODUODENOSCOPY     EYE SURGERY     INGUINAL HERNIA REPAIR     JOINT REPLACEMENT  KNEE ARTHROSCOPY Left 2012   "torn meniscus"   POSTERIOR LUMBAR FUSION  02/2013   "L4-5; hardware in place"   PROSTATE BIOPSY  2013   PROSTATE SURGERY  2013   "brachytherapy for prostate radiation"   TONSILLECTOMY  1950s   TOTAL KNEE ARTHROPLASTY Left 12/18/2017   TOTAL KNEE ARTHROPLASTY Left 12/18/2017   Procedure: TOTAL KNEE ARTHROPLASTY;  Surgeon: Vickey Huger, MD;  Location: [place];  Service: Orthopedics;  Laterality: Left;     Current Outpatient Medications  Medication Sig Dispense Refill   acetaminophen (TYLENOL) 500 MG tablet Take 500 mg by mouth every 6 (six) hours as needed.     albuterol (PROAIR HFA) 108 (90 Base) MCG/ACT inhaler Inhale 2  puffs into the lungs every 6 (six) hours as needed (for wheezing or shortness of breath). 18 g 6   atorvastatin (LIPITOR) 40 MG tablet Take 1 tablet (40 mg total) by mouth daily at 6 PM. 30 tablet 6   baclofen (LIORESAL) 10 MG tablet Take 10 mg by mouth 2 (two) times daily.     budesonide-formoterol (SYMBICORT) 80-4.5 MCG/ACT inhaler Inhale 2 puffs into the lungs 2 (two) times daily as needed (for flares).      Cholecalciferol (VITAMIN D-3 PO) Take 1 capsule by mouth 2 (two) times a week.     clopidogrel (PLAVIX) 75 MG tablet TAKE 1 TABLET DAILY WITH BREAKFAST 90 tablet 1   Coenzyme Q10 (CO Q-10) 120 MG CAPS Take 120 mg by mouth 3 (three) times daily.     cyclobenzaprine (FLEXERIL) 10 MG tablet Take 0.5 tablets (5 mg total) by mouth 2 (two) times daily as needed for muscle spasms. 20 tablet 0   diclofenac Sodium (VOLTAREN) 1 % GEL Apply 1 application. topically. 3-4 times daily     levothyroxine (SYNTHROID, LEVOTHROID) 125 MCG tablet Take 125 mcg by mouth daily before breakfast.     metoprolol succinate (TOPROL-XL) 50 MG 24 hr tablet Take 1 tablet (50 mg total) by mouth daily. Take with or immediately following a meal. 90 tablet 3   mexiletine (MEXITIL) 150 MG capsule Take 2 capsules (300 mg total) by mouth 2 (two) times daily. 360 capsule 1   Multiple Vitamins-Minerals (PRESERVISION AREDS 2 PO) Take 1 capsule by mouth 2 (two) times daily.     nitroGLYCERIN (NITROSTAT) 0.4 MG SL tablet Place 1 tablet (0.4 mg total) under the tongue every 5 (five) minutes as needed for chest pain (x 3 doses). 25 tablet 0   pantoprazole (PROTONIX) 40 MG tablet Take 1 tablet (40 mg total) by mouth daily. 90 tablet 3   prednisoLONE acetate (PRED FORTE) 1 % ophthalmic suspension Place 1 drop into the left eye 4 (four) times daily.     sildenafil (REVATIO) 20 MG tablet Take 1-5 tablets by mouth daily as needed.     linaclotide (LINZESS) 72 MCG capsule Take 1 capsule (72 mcg total) by mouth daily before breakfast. (Patient  not taking: Reported on 04/13/2022) 16 capsule 0   No current facility-administered medications for this visit.    Allergies:   Topiramate, Celecoxib, Pitavastatin, Rosuvastatin calcium, and Hydrocodone-acetaminophen   Social History:  The patient  reports that he has never smoked. He has never used smokeless tobacco. He reports current alcohol use. He reports that he does not use drugs.   Family History:  The patient's family history includes Heart attack in his father; Heart disease in his father.   ROS:  Please see the history of present illness.  Otherwise, review of systems is positive for none.   All other systems are reviewed and negative.   PHYSICAL EXAM: VS:  BP 136/88   Pulse 86   Ht '5\' 7"'$  (1.702 m)   Wt 169 lb 12.8 oz (77 kg)   SpO2 97%   BMI 26.59 kg/m  , BMI Body mass index is 26.59 kg/m. GEN: Well nourished, well developed, in no acute distress  HEENT: normal  Neck: no JVD, carotid bruits, or masses Cardiac: RRR; no murmurs, rubs, or gallops,no edema  Respiratory:  clear to auscultation bilaterally, normal work of breathing GI: soft, nontender, nondistended, + BS MS: no deformity or atrophy  Skin: warm and dry Neuro:  Strength and sensation are intact Psych: euthymic mood, full affect  EKG:  EKG is ordered today. Personal review of the ekg ordered shows sinus rhythm, PVC, rate 86  Recent Labs: No results found for requested labs within last 365 days.    Lipid Panel     Component Value Date/Time   CHOL 149 10/26/2016 0927   TRIG 116 10/26/2016 0927   HDL 53 10/26/2016 0927   CHOLHDL 2.8 10/26/2016 0927   CHOLHDL 2.9 05/23/2016 0949   VLDL 18 05/23/2016 0949   LDLCALC 73 10/26/2016 0927     Wt Readings from Last 3 Encounters:  04/13/22 169 lb 12.8 oz (77 kg)  02/19/22 175 lb (79.4 kg)  01/07/22 174 lb (78.9 kg)      Other studies Reviewed: Additional studies/ records that were reviewed today include: TTE 04/08/21 Review of the above records  today demonstrates:   1. Left ventricular ejection fraction, by estimation, is 45 to 50%. The  left ventricle has mildly decreased function. The left ventricle  demonstrates global hypokinesis. Left ventricular diastolic parameters are  consistent with Grade I diastolic  dysfunction (impaired relaxation).   2. Right ventricular systolic function is normal. The right ventricular  size is normal.   3. The mitral valve is normal in structure. Trivial mitral valve  regurgitation. No evidence of mitral stenosis.   4. The aortic valve is tricuspid. Aortic valve regurgitation is not  visualized. Mild aortic valve sclerosis is present, with no evidence of  aortic valve stenosis.   5. The inferior vena cava is normal in size with greater than 50%  respiratory variability, suggesting right atrial pressure of 3 mmHg.   Cath 01/29/16 Mid LAD lesion, 50% stenosed. 2nd Mrg lesion, 25% stenosed. The left ventricular systolic function is normal. RPDA lesion, 95% stenosed. Post intervention with overlapping drug eluting stents postdilated to > 3 mm, there is a 0% residual stenosis. Due to severe tortuosity in his aortic arch, would not pursue right radial approach in this patient in the future if catheterization was needed. Normal pulmonary artery pressures.  Cardiac monitor 01/04/2022 personally reviewed Predominant rhythm was sinus rhythm Less than 1% supraventricular ectopy 22.8% ventricular ectopy No triggered episodes or symptoms noted   ASSESSMENT AND PLAN:  1.  PVCs: Currently on mexiletine 300 mg twice daily.  He today shows 1 PVC.  On auscultation, he is quite regular.  We Gayl Ivanoff continue with current management.  Should he develop symptoms or more PVCs on ECG a repeat monitor would be reasonable.  2.  Coronary artery disease: Has chronic stable angina.  Post PCI to the RCA.  No current chest pain.  3.  Hypertension: Currently well controlled  4.  Hypothyroidism: Currently on Synthroid.   Attempting to avoid amiodarone.  Current medicines are reviewed at  length with the patient today.   The patient does not have concerns regarding his medicines.  The following changes were made today: None  Labs/ tests ordered today include:  Orders Placed This Encounter  Procedures   EKG 12-Lead     Disposition:   FU 6 months  Signed, Aijah Lattner Meredith Leeds, MD  04/13/2022 10:43 AM     [facility] [address] [place] [address] [phone] (office) [phone] (fax)

## 2022-05-01 ENCOUNTER — Other Ambulatory Visit: Payer: Self-pay | Admitting: Cardiology

## 2022-05-23 ENCOUNTER — Encounter (INDEPENDENT_AMBULATORY_CARE_PROVIDER_SITE_OTHER): Payer: Medicare HMO | Admitting: Ophthalmology

## 2022-05-23 DIAGNOSIS — H43813 Vitreous degeneration, bilateral: Secondary | ICD-10-CM

## 2022-05-23 DIAGNOSIS — H35373 Puckering of macula, bilateral: Secondary | ICD-10-CM

## 2022-05-23 DIAGNOSIS — I10 Essential (primary) hypertension: Secondary | ICD-10-CM

## 2022-05-23 DIAGNOSIS — H35033 Hypertensive retinopathy, bilateral: Secondary | ICD-10-CM

## 2022-05-23 DIAGNOSIS — H353132 Nonexudative age-related macular degeneration, bilateral, intermediate dry stage: Secondary | ICD-10-CM

## 2022-05-23 DIAGNOSIS — H59032 Cystoid macular edema following cataract surgery, left eye: Secondary | ICD-10-CM | POA: Diagnosis not present

## 2022-07-25 ENCOUNTER — Other Ambulatory Visit: Payer: Self-pay | Admitting: Physician Assistant

## 2022-08-13 ENCOUNTER — Other Ambulatory Visit: Payer: Self-pay | Admitting: Student

## 2022-09-19 ENCOUNTER — Encounter (INDEPENDENT_AMBULATORY_CARE_PROVIDER_SITE_OTHER): Payer: Medicare HMO | Admitting: Ophthalmology

## 2022-09-19 DIAGNOSIS — H35373 Puckering of macula, bilateral: Secondary | ICD-10-CM

## 2022-09-19 DIAGNOSIS — H35033 Hypertensive retinopathy, bilateral: Secondary | ICD-10-CM

## 2022-09-19 DIAGNOSIS — I10 Essential (primary) hypertension: Secondary | ICD-10-CM

## 2022-09-19 DIAGNOSIS — H59032 Cystoid macular edema following cataract surgery, left eye: Secondary | ICD-10-CM | POA: Diagnosis not present

## 2022-09-19 DIAGNOSIS — H43813 Vitreous degeneration, bilateral: Secondary | ICD-10-CM

## 2022-09-19 DIAGNOSIS — H353132 Nonexudative age-related macular degeneration, bilateral, intermediate dry stage: Secondary | ICD-10-CM

## 2022-09-28 ENCOUNTER — Other Ambulatory Visit: Payer: Self-pay | Admitting: Student

## 2022-10-11 ENCOUNTER — Other Ambulatory Visit: Payer: Self-pay

## 2022-10-11 MED ORDER — MEXILETINE HCL 150 MG PO CAPS: 300.0000 mg | ORAL_CAPSULE | Freq: Two times a day (BID) | ORAL | 2 refills | Status: DC

## 2022-11-24 NOTE — Progress Notes (Signed)
Cardiology Office Note Date:  11/24/2022  Patient ID:  Duane, Reyes 04-13-1951, MRN [id number] PCP:  [name], MD  Cardiologist:  [name]  Electrophysiologist: [name]    Chief Complaint:  6 mo  History of Present Illness: Duane Reyes is a 72 y.o. male with history of CAD ([place] 01/2016 and he was found to have 50% mid LAD, 25% OM2, 95% RPDA s/p PCI), SOB > abnormal PFTs >> seen by Dr. Malvin Johns. He reports he was felt to have restricted breathing secondary to scoliosis. No COPD, PVCs, hypothyroidism, HTN, HLD  He [name], Dec 2020.  He was feeling well, no perceived PVCs/palpitations, able to do all of his ADLs.  His mother had passed away and had some family issues, though physically feeling well. Mentioned trying to avoid amio given baseline hypothyroidism.  I saw him Oct 2021 PVC burden only 2.6%  He has seen A. Tillery, PA-C since a couple times, with c/o some SOB, TTE noted LVEF reduced 45-50% his CCB changed to BB and planned for myoview which was low risk, no ischemic, normal study with LVEF 45-54% > 50% He last saw [name]  04/23/21, c/w SOB, with hx of restrictive lung disease, was lost to f/u with pulmonary since 2019 Not felt to be volume OL, discussed adding spironolactone and advancing GDMT thiugh the pt declined.  Discussed perhaps losartan at his next visit if pt agreeable  He saw [name], NP 08/09/21 for pre-op evaluation prior to epidural steroid injection, no new complaints or symptoms, c/w mild chronic SOB.  Revisited GDMT consideration for ARB or more selective BB given some pulm disease he declined any changes. Felt an acceptable risk for planned procedure, ok to hold plavix pre-procedure.  He was seen at his GI office 12/13/21 for chronic constipation, found to have an irregular, slow HR of 37 and referred to his PMD, he was seen same day EKG reported to have PVCs noted with HR 60's at their office. Unclear what his symptoms were the day  prior that the pt or his wife reported, was described as asymptomatic at the PMD office I don't see any changes made to his meds.   I saw him 12/16/21 He is accompanied by his wife. He mentions that he gets short winded and had poor exertional tolerance now for a "good long while", this is unchanged. IN the last 27mo probably he has had a sense intermittently of feeling a little off balance, perhaps a little lightheaded. In the last 3 weeks this has become a more persistent symptoms. Infrequently feels a light flutter in his heart beat No near syncope or syncope No CP Initial EKG done is SR with no PVCs, though his wife says they never happen when he is supine and requested another EKG be done seated. While waiting for the MA by auscultation seated he was not having PVCs The seated EKG SR one PVC Rhythm strips with only a couple PVCs, no bradycardia Planned for repeat monitoring PVC burden up to 22% and mexiletine increased to 300mg  BID  He saw [name] 04/13/22, no palpitations, PVCs improved, had LLE pain, onePVC noted only this day, no changes were made. Discussed repeat monitoring if recurrent symptoms  TODAY He is doing well Infrequently has a fleeting awareness of his heart beat a little irregular, none that are persistent and not associated with any symptoms.  No clear trigger, perhaps when more actove. He is looking forward to the  weather now, likes to get out in the yard, walks some for exercise with stable exertional capacity. Some days has to pace himself. No CP No near syncope or syncope.  He sees his PMD regularly, has labs about 2x/year there, he monitors his lipids    PVCs AAD Hx Mexiletine started Nov 2018 is current Historically reported to be intolerant of betablockers 2/2 severe fatigue and bradycardia  Past Medical History:  Diagnosis Date   Arthritis    "left knee" (01/29/2016)   Bradycardia, drug induced 10/26/2016   Bronchiolitis    CAD in native  artery    a. Abnl cardiac CT ; b. LHC 01/2016 s/p 50% mLAD, 25% OM2, 95% RPDA which was treated with overlapping DES; c. MV 03/2021 negative for ischemia   Chronic combined systolic and diastolic heart failure (HCC)    HFmrEF, echo 03/2021: EF 45-50%, GIDD.   Chronic knee pain    Chronic lower back pain    DOE (dyspnea on exertion)    Dyspnea    Frequent PVCs    a. 12/2015 - Holter showed SB, NSR, ST, HR 54-108, with frequent PVCs in singles, couplets, and bigemy, with elevated PVC load 24%.   GERD (gastroesophageal reflux disease)    History of shingles    Hyperlipidemia    Hypertension    Hypothyroidism    Prostate cancer ([place])    Scoliosis    Spinal stenosis    Spondylolisthesis of lumbar region    Tortuosity of aortic arch    Tortuosity of aortic arch, -would not pursue Rt radial approach in this patient for future cardiac caths     Past Surgical History:  Procedure Laterality Date   ANGIOPLASTY     BACK SURGERY     CARDIAC CATHETERIZATION N/A 01/29/2016   Procedure: Right/Left Heart Cath and Coronary Angiography;  Surgeon: Jettie Booze, MD;  Location: [facility];  Service: Cardiovascular;  Laterality: N/A;   CARDIAC CATHETERIZATION N/A 01/29/2016   Procedure: Coronary Stent Intervention 2.5/16mm Synergy-proximal PDA;  Surgeon: Jettie Booze, MD;  Location: [facility];  Service: Cardiovascular;  Laterality: N/A;   CATARACT EXTRACTION W/ INTRAOCULAR LENS IMPLANT Left 11/2015   COLONOSCOPY     CORONARY ANGIOPLASTY WITH STENT PLACEMENT  01/29/2016   "2 stents"   CYST EXCISION Right 2005   "near bicep"   ESOPHAGOGASTRODUODENOSCOPY     EYE SURGERY     INGUINAL HERNIA REPAIR     JOINT REPLACEMENT     KNEE ARTHROSCOPY Left 2012   "torn meniscus"   POSTERIOR LUMBAR FUSION  02/2013   "L4-5; hardware in place"   PROSTATE BIOPSY  2013   PROSTATE SURGERY  2013   "brachytherapy for prostate radiation"   TONSILLECTOMY  1950s   TOTAL KNEE ARTHROPLASTY Left  12/18/2017   TOTAL KNEE ARTHROPLASTY Left 12/18/2017   Procedure: TOTAL KNEE ARTHROPLASTY;  Surgeon: Vickey Huger, MD;  Location: [facility];  Service: Orthopedics;  Laterality: Left;    Current Outpatient Medications  Medication Sig Dispense Refill   acetaminophen (TYLENOL) 500 MG tablet Take 500 mg by mouth every 6 (six) hours as needed.     albuterol (PROAIR HFA) 108 (90 Base) MCG/ACT inhaler Inhale 2 puffs into the lungs every 6 (six) hours as needed (for wheezing or shortness of breath). 18 g 6   atorvastatin (LIPITOR) 40 MG tablet Take 1 tablet (40 mg total) by mouth daily at 6 PM. 30 tablet 6   baclofen (LIORESAL) 10 MG  tablet Take 10 mg by mouth 2 (two) times daily.     budesonide-formoterol (SYMBICORT) 80-4.5 MCG/ACT inhaler Inhale 2 puffs into the lungs 2 (two) times daily as needed (for flares).      Cholecalciferol (VITAMIN D-3 PO) Take 1 capsule by mouth 2 (two) times a week.     clopidogrel (PLAVIX) 75 MG tablet TAKE 1 TABLET DAILY WITH BREAKFAST 90 tablet 3   Coenzyme Q10 (CO Q-10) 120 MG CAPS Take 120 mg by mouth 3 (three) times daily.     cyclobenzaprine (FLEXERIL) 10 MG tablet Take 0.5 tablets (5 mg total) by mouth 2 (two) times daily as needed for muscle spasms. 20 tablet 0   diclofenac Sodium (VOLTAREN) 1 % GEL Apply 1 application. topically. 3-4 times daily     levothyroxine (SYNTHROID, LEVOTHROID) 125 MCG tablet Take 125 mcg by mouth daily before breakfast.     linaclotide (LINZESS) 72 MCG capsule Take 1 capsule (72 mcg total) by mouth daily before breakfast. (Patient not taking: Reported on 04/13/2022) 16 capsule 0   metoprolol succinate (TOPROL-XL) 50 MG 24 hr tablet TAKE 1 TABLET EVERY DAY WITH OR IMMEDIATELY FOLLOWING A MEAL. DISCONTINUE VERAPAMIL 90 tablet 2   mexiletine (MEXITIL) 150 MG capsule Take 2 capsules (300 mg total) by mouth 2 (two) times daily. 360 capsule 2   Multiple Vitamins-Minerals (PRESERVISION AREDS 2 PO) Take 1 capsule by mouth 2 (two) times daily.      nitroGLYCERIN (NITROSTAT) 0.4 MG SL tablet Place 1 tablet (0.4 mg total) under the tongue every 5 (five) minutes as needed for chest pain (x 3 doses). 25 tablet 0   pantoprazole (PROTONIX) 40 MG tablet Take 1 tablet (40 mg total) by mouth daily. 90 tablet 3   prednisoLONE acetate (PRED FORTE) 1 % ophthalmic suspension Place 1 drop into the left eye 4 (four) times daily.     sildenafil (REVATIO) 20 MG tablet Take 1-5 tablets by mouth daily as needed.     No current facility-administered medications for this visit.    Allergies:   Topiramate, Celecoxib, Pitavastatin, Rosuvastatin calcium, and Hydrocodone-acetaminophen   Social History:  The patient  reports that he has never smoked. He has never used smokeless tobacco. He reports current alcohol use. He reports that he does not use drugs.   Family History:  The patient's family history includes Heart attack in his father; Heart disease in his father.  ROS:  Please see the history of present illness.    All other systems are reviewed and otherwise negative.   PHYSICAL EXAM:  VS:  There were no vitals taken for this visit. BMI: There is no height or weight on file to calculate BMI. Well nourished, well developed, in no acute distress HEENT: normocephalic, atraumatic Neck: no JVD, carotid bruits or masses Cardiac: RRR; NO extrasystoles are appreciated with a prolonged ascultation, no significant murmurs, no rubs, or gallops Lungs:  CTA b/l, no wheezing, rhonchi or rales Abd: soft, nontender MS: scoliosis is appreciated otherwise no deformity or atrophy Ext: no edema Skin: warm and dry, no rash Neuro:  No gross deficits appreciated Psych: euthymic mood, full affect    EKG:  Done today and reviewed by myself shows  SB 59bpm, no PVCs  04/14/21: stress myoview The left ventricular ejection fraction is mildly decreased (45-54%). Nuclear stress EF: 50%. There was no ST segment deviation noted during stress. No T wave inversion was noted  during stress. The study is normal. This is a low risk study.  Low risk stress nuclear study with normal perfusion and mildly reduced left ventricular global systolic function. Left ventricular ejection fraction has improved since 2019.   04/08/21: TTE IMPRESSIONS   1. Left ventricular ejection fraction, by estimation, is 45 to 50%. The  left ventricle has mildly decreased function. The left ventricle  demonstrates global hypokinesis. Left ventricular diastolic parameters are  consistent with Grade I diastolic  dysfunction (impaired relaxation).   2. Right ventricular systolic function is normal. The right ventricular  size is normal.   3. The mitral valve is normal in structure. Trivial mitral valve  regurgitation. No evidence of mitral stenosis.   4. The aortic valve is tricuspid. Aortic valve regurgitation is not  visualized. Mild aortic valve sclerosis is present, with no evidence of  aortic valve stenosis.   5. The inferior vena cava is normal in size with greater than 50%  respiratory variability, suggesting right atrial pressure of 3 mmHg.   Oct 2021 Monitor Max 160 bpm 07:37pm, 10/16 Min 47 bpm 09:41am, 10/14 Avg 77 bpm 3.6% PVCs <1% supraventricular ectopy Predominant rhythm was sinus rhythm Longest SVT run 9 beats No symptoms reported   10/24/2017; TTE Study Conclusions  - Left ventricle: The cavity size was normal. Posterior wall    thickness was increased in a pattern of mild LVH. Systolic    function was normal. The estimated ejection fraction was in the    range of 50% to 55%. Wall motion was normal; there were no    regional wall motion abnormalities. Doppler parameters are    consistent with abnormal left ventricular relaxation (grade 1    diastolic dysfunction). Doppler parameters are consistent with    high ventricular filling pressure.  - Aortic valve: Transvalvular velocity was within the normal range.    There was no stenosis. There was no  regurgitation.  - Mitral valve: Transvalvular velocity was within the normal range.    There was no evidence for stenosis. There was trivial    regurgitation.  - Right ventricle: The cavity size was normal. Wall thickness was    normal. Systolic function was normal.  - Atrial septum: No defect or patent foramen ovale was identified.  - Tricuspid valve: There was no regurgitation.   Impressions:  - Frequent ectopy makes systolic function difficult to assess but    appears to be very mildly reduced.      10/10/2017: stress myoview Nuclear stress EF: 27%. There was no ST segment deviation noted during stress. Findings consistent with prior myocardial infarction. This is a high risk study. The left ventricular ejection fraction is severely decreased (<30%).   Anterior and apical as well as inferoapical infarct no ischemia EF 27%   Holter 06/02/17  Minimum HR: 49 BPM at 7:12:05 AM(2) Maximum HR: 123 BPM at 12:40:53 PM(2) Average HR: 75 BPM 31% PVCs <1% APCs Sinus rhythm  One run of 10 beats of SVT Zero atrial fibrillation   12/19/2016 48 hour monitor Normal sinus rhythm with average heart rate 83bpm. The heart rate ranged from 55-116bpm. Frequent PVCs, bigeminal PVCs, ventricular couplets and salvos. Occasional PACs PVC load was 46%   01/29/2016; LHC/PCI Mid LAD lesion, 50% stenosed. 2nd Mrg lesion, 25% stenosed. The left ventricular systolic function is normal. RPDA lesion, 95% stenosed. Post intervention with overlapping drug eluting stents postdilated to > 3 mm, there is a 0% residual stenosis. Due to severe tortuosity in his aortic arch, would not pursue right radial approach in this patient in the future  if catheterization was needed. Normal pulmonary artery pressures.   The patient we watched overnight. Continue dual antiplatelet therapy for at least a year. He'll need aggressive secondary prevention. Plan discharge tomorrow.     Recent Labs: No results found for  requested labs within last 365 days.  No results found for requested labs within last 365 days.   CrCl cannot be calculated (Patient's most recent lab result is older than the maximum 21 days allowed.).   Wt Readings from Last 3 Encounters:  04/13/22 169 lb 12.8 oz (77 kg)  02/19/22 175 lb (79.4 kg)  01/07/22 174 lb (78.9 kg)     Other studies reviewed: Additional studies/records reviewed today include: summarized above  ASSESSMENT AND PLAN:  1. CAD     PCI in 2017     No anginal symptoms     On ASA/clopidogrel, statin, BB     He is overdue to see [name] and team   2. PVCs     on mexiletine     Well controlled   3. HTN     Looks good   4. HLD     Monitored and managed with his PMD     Disposition: back with EP in 29mo, sooner if needed, overdue for gen cardiology team, will get him scheduled to see them  Current medicines are reviewed at length with the patient today.  The patient did not have any concerns regarding medicines.  Venetia Night, PA-C 11/24/2022 7:44 AM     [place]  [place] [address] [phone] (office)  [phone] (fax)

## 2022-11-25 ENCOUNTER — Encounter: Payer: Self-pay | Admitting: Physician Assistant

## 2022-11-25 ENCOUNTER — Ambulatory Visit: Payer: Medicare HMO | Attending: Physician Assistant | Admitting: Physician Assistant

## 2022-11-25 VITALS — BP 116/70 | HR 59 | Ht 67.0 in | Wt 170.0 lb

## 2022-11-25 DIAGNOSIS — I10 Essential (primary) hypertension: Secondary | ICD-10-CM | POA: Diagnosis not present

## 2022-11-25 DIAGNOSIS — I493 Ventricular premature depolarization: Secondary | ICD-10-CM

## 2022-11-25 DIAGNOSIS — E785 Hyperlipidemia, unspecified: Secondary | ICD-10-CM | POA: Diagnosis not present

## 2022-11-25 DIAGNOSIS — I2510 Atherosclerotic heart disease of native coronary artery without angina pectoris: Secondary | ICD-10-CM

## 2022-11-25 NOTE — Patient Instructions (Addendum)
Medication Instructions:   Your physician recommends that you continue on your current medications as directed. Please refer to the Current Medication list given to you today.   *If you need a refill on your cardiac medications before your next appointment, please call your pharmacy*   Lab Work: [place]   If you have labs (blood work) drawn today and your tests are completely normal, you will receive your results only by: [place] (if you have MyChart) OR A paper copy in the mail If you have any lab test that is abnormal or we need to change your treatment, we will call you to review the results.   Testing/Procedures: NONE ORDERED  TODAY     Follow-Up: At [facility], you and your health needs are our priority.  As part of our continuing mission to provide you with exceptional heart care, we have created designated Provider Care Teams.  These Care Teams include your primary Cardiologist (physician) and Advanced Practice Providers (APPs -  Physician Assistants and Nurse Practitioners) who all work together to provide you with the care you need, when you need it.  We recommend signing up for the patient portal called "MyChart".  Sign up information is provided on this After Visit Summary.  MyChart is used to connect with patients for Virtual Visits (Telemedicine).  Patients are able to view lab/test results, encounter notes, upcoming appointments, etc.  Non-urgent messages can be sent to your provider as well.   To learn more about what you can do with MyChart, go to [email or web address].    Your next appointment:  NEXT AVAILABLE WITH DR TURNER  6 month(s)  Provider:   You may see [name], MD or one of the following Advanced Practice Providers on your designated Care Team:   Tommye Standard, [place] Legrand Como "[name]" Chalmers Cater, [place]   Other Instructions

## 2022-12-23 ENCOUNTER — Encounter: Payer: Self-pay | Admitting: Cardiology

## 2022-12-23 ENCOUNTER — Ambulatory Visit: Payer: Medicare HMO | Attending: Cardiology | Admitting: Cardiology

## 2022-12-23 VITALS — BP 134/72 | HR 66 | Ht 67.5 in | Wt 169.6 lb

## 2022-12-23 DIAGNOSIS — I2510 Atherosclerotic heart disease of native coronary artery without angina pectoris: Secondary | ICD-10-CM | POA: Diagnosis not present

## 2022-12-23 DIAGNOSIS — I10 Essential (primary) hypertension: Secondary | ICD-10-CM | POA: Diagnosis not present

## 2022-12-23 DIAGNOSIS — E785 Hyperlipidemia, unspecified: Secondary | ICD-10-CM

## 2022-12-23 DIAGNOSIS — I493 Ventricular premature depolarization: Secondary | ICD-10-CM | POA: Diagnosis not present

## 2022-12-23 MED ORDER — ATORVASTATIN CALCIUM 80 MG PO TABS: 80.0000 mg | ORAL_TABLET | Freq: Every day | ORAL | 3 refills | Status: DC

## 2022-12-23 NOTE — Progress Notes (Signed)
Cardiology Office Note:    Date:  12/23/2022   ID:  Duane Reyes, DOB Apr 01, 1951, MRN [id number]  PCP:  Eartha Inch, MD  Cardiologist:  Armanda Magic, MD    Referring MD: Eartha Inch, MD   Chief Complaint  Patient presents with   Coronary Artery Disease   Hypertension   Hyperlipidemia    History of Present Illness:    Duane Reyes is a 72 y.o. male with a hx of  HTN, dyslipidemia, PVCs and GERD who presents back for followup.  He has not been seen by general Cards since 2018.   Coronary CTA done for chest pain showed significant calcification of the coronary arteries and subsequently underwent cath 01/2016 which revealed 50% mid LAD, 25% OM2, 95% RPDA s/p PCI.  2D echo 04/08/2021 showed mildly reduced LV function with EF 45 to 50% with global hypokinesis, trivial MR.  He is here today for followup and is doing well.  He has chronic DOE that is very stable. He denies any chest pain or pressure, PND, orthopnea, LE edema, dizziness, palpitations or syncope. He is compliant with his meds and is tolerating meds with no SE.    Past Medical History:  Diagnosis Date   Arthritis    "left knee" (01/29/2016)   Bradycardia, drug induced 10/26/2016   Bronchiolitis    CAD in native artery    a. Abnl cardiac CT ; b. LHC 01/2016 s/p 50% mLAD, 25% OM2, 95% RPDA which was treated with overlapping DES; c. MV 03/2021 negative for ischemia   Chronic combined systolic and diastolic heart failure (HCC)    HFmrEF, echo 03/2021: EF 45-50%, GIDD.   Chronic knee pain    Chronic lower back pain    DOE (dyspnea on exertion)    Dyspnea    Frequent PVCs    a. 12/2015 - Holter showed SB, NSR, ST, HR 54-108, with frequent PVCs in singles, couplets, and bigemy, with elevated PVC load 24%.   GERD (gastroesophageal reflux disease)    History of shingles    Hyperlipidemia    Hypertension    Hypothyroidism    Prostate cancer (HCC)    Scoliosis    Spinal stenosis    Spondylolisthesis of lumbar  region    Tortuosity of aortic arch    Tortuosity of aortic arch, -would not pursue Rt radial approach in this patient for future cardiac caths     Past Surgical History:  Procedure Laterality Date   ANGIOPLASTY     BACK SURGERY     CARDIAC CATHETERIZATION N/A 01/29/2016   Procedure: Right/Left Heart Cath and Coronary Angiography;  Surgeon: Corky Crafts, MD;  Location: [facility] INVASIVE CV LAB;  Service: Cardiovascular;  Laterality: N/A;   CARDIAC CATHETERIZATION N/A 01/29/2016   Procedure: Coronary Stent Intervention 2.5/16mm Synergy-proximal PDA;  Surgeon: Corky Crafts, MD;  Location: [facility] INVASIVE CV LAB;  Service: Cardiovascular;  Laterality: N/A;   CATARACT EXTRACTION W/ INTRAOCULAR LENS IMPLANT Left 11/2015   COLONOSCOPY     CORONARY ANGIOPLASTY WITH STENT PLACEMENT  01/29/2016   "2 stents"   CYST EXCISION Right 2005   "near bicep"   ESOPHAGOGASTRODUODENOSCOPY     EYE SURGERY     INGUINAL HERNIA REPAIR     JOINT REPLACEMENT     KNEE ARTHROSCOPY Left 2012   "torn meniscus"   POSTERIOR LUMBAR FUSION  02/2013   "L4-5; hardware in place"   PROSTATE BIOPSY  2013   PROSTATE SURGERY  2013   "  brachytherapy for prostate radiation"   TONSILLECTOMY  1950s   TOTAL KNEE ARTHROPLASTY Left 12/18/2017   TOTAL KNEE ARTHROPLASTY Left 12/18/2017   Procedure: TOTAL KNEE ARTHROPLASTY;  Surgeon: Dannielle Huh, MD;  Location: [facility] OR;  Service: Orthopedics;  Laterality: Left;    Current Medications: Current Meds  Medication Sig   acetaminophen (TYLENOL) 500 MG tablet Take 500 mg by mouth every 6 (six) hours as needed.   albuterol (PROAIR HFA) 108 (90 Base) MCG/ACT inhaler Inhale 2 puffs into the lungs every 6 (six) hours as needed (for wheezing or shortness of breath).   atorvastatin (LIPITOR) 40 MG tablet Take 1 tablet (40 mg total) by mouth daily at 6 PM.   baclofen (LIORESAL) 10 MG tablet Take 10 mg by mouth as needed.   budesonide-formoterol (SYMBICORT) 80-4.5 MCG/ACT inhaler Inhale 2 puffs  into the lungs 2 (two) times daily as needed (for flares).    Cholecalciferol (VITAMIN D-3 PO) Take 1 capsule by mouth 2 (two) times a week.   clopidogrel (PLAVIX) 75 MG tablet TAKE 1 TABLET DAILY WITH BREAKFAST   Coenzyme Q10 (CO Q-10) 120 MG CAPS Take 120 mg by mouth 3 (three) times daily.   diclofenac Sodium (VOLTAREN) 1 % GEL Apply 1 application. topically. 3-4 times daily   gabapentin (NEURONTIN) 300 MG capsule Take by mouth.   hydrocortisone 2.5 % cream Apply topically as needed.   levothyroxine (SYNTHROID, LEVOTHROID) 125 MCG tablet Take 125 mcg by mouth daily before breakfast.   metoprolol succinate (TOPROL-XL) 50 MG 24 hr tablet TAKE 1 TABLET EVERY DAY WITH OR IMMEDIATELY FOLLOWING A MEAL. DISCONTINUE VERAPAMIL   mexiletine (MEXITIL) 150 MG capsule Take 2 capsules (300 mg total) by mouth 2 (two) times daily.   montelukast (SINGULAIR) 10 MG tablet Take by mouth daily.   Multiple Vitamins-Minerals (PRESERVISION AREDS 2 PO) Take 1 capsule by mouth 2 (two) times daily.   nitroGLYCERIN (NITROSTAT) 0.4 MG SL tablet Place 1 tablet (0.4 mg total) under the tongue every 5 (five) minutes as needed for chest pain (x 3 doses).   pantoprazole (PROTONIX) 40 MG tablet Take 1 tablet (40 mg total) by mouth daily.   prednisoLONE acetate (PRED FORTE) 1 % ophthalmic suspension Place 1 drop into the left eye 4 (four) times daily.   sildenafil (REVATIO) 20 MG tablet Take 1-5 tablets by mouth daily as needed.     Allergies:   Topiramate, Celecoxib, Pitavastatin, Rosuvastatin calcium, and Hydrocodone-acetaminophen   Social History   Socioeconomic History   Marital status: Married    Spouse name: Not on file   Number of children: Not on file   Years of education: Not on file   Highest education level: Not on file  Occupational History   Not on file  Tobacco Use   Smoking status: Never   Smokeless tobacco: Never  Vaping Use   Vaping Use: Never used  Substance and Sexual Activity   Alcohol use: Yes     Comment: 01/29/2016 "glass of wine q couple months"   Drug use: No   Sexual activity: Not Currently  Other Topics Concern   Not on file  Social History Narrative   Not on file   Social Determinants of Health   Financial Resource Strain: Not on file  Food Insecurity: Not on file  Transportation Needs: Not on file  Physical Activity: Not on file  Stress: Not on file  Social Connections: Not on file     Family History: The patient's family history includes Heart  attack in his father; Heart disease in his father.  ROS:   Please see the history of present illness.    ROS  All other systems reviewed and negative.   EKGs/Labs/Other Studies Reviewed:    The following studies were reviewed today: none  EKG:  EKG is not ordered today.   Recent Labs: No results found for requested labs within last 365 days.   Recent Lipid Panel    Component Value Date/Time   CHOL 149 10/26/2016 0927   TRIG 116 10/26/2016 0927   HDL 53 10/26/2016 0927   CHOLHDL 2.8 10/26/2016 0927   CHOLHDL 2.9 05/23/2016 0949   VLDL 18 05/23/2016 0949   LDLCALC 73 10/26/2016 0927     Physical Exam:    VS:  BP 134/72   Pulse 66   Ht 5' 7.5" (1.715 m)   Wt 169 lb 9.6 oz (76.9 kg)   SpO2 97%   BMI 26.17 kg/m     Wt Readings from Last 3 Encounters:  12/23/22 169 lb 9.6 oz (76.9 kg)  11/25/22 170 lb (77.1 kg)  04/13/22 169 lb 12.8 oz (77 kg)     GEN:  Well nourished, well developed in no acute distress HEENT: Normal NECK: No JVD; No carotid bruits LYMPHATICS: No lymphadenopathy CARDIAC: RRR, no murmurs, rubs, gallops RESPIRATORY:  Clear to auscultation without rales, wheezing or rhonchi  ABDOMEN: Soft, non-tender, non-distended MUSCULOSKELETAL:  No edema; No deformity  SKIN: Warm and dry NEUROLOGIC:  Alert and oriented x 3 PSYCHIATRIC:  Normal affect   ASSESSMENT:    1. Coronary artery disease involving native coronary artery of native heart without angina pectoris   2. Primary  hypertension   3. Dyslipidemia   4. PVC's (premature ventricular contractions)    PLAN:    In order of problems listed above:   ASCAD  -1 vessel obstructive with 50% mid LAD, 25% OM2, 95% RPDA s/p PCI in 2017.   -He denies any anginal symptoms and is doing well  -Stress Myoview 04/14/2021 showed EF 50% with no ischemia -Continue prescription drug management atorvastatin 40 mg daily, Plavix 75 mg daily, Toprol-XL 50 mg daily with as needed refills  2.  HTN -  -BP controlled on exam today  -Continue prescription drug management with Toprol-XL 50 mg daily with as needed refills   3.  Hyperlipidemia  - LDL goal < 70.  -I have personally reviewed and interpreted outside labs performed by patient's PCP which showed LDL 87 and HDL 56 and ALT 35 on 11/02/2022 -Increase Atorvastatin to 80mg  daily  -check FLP and ALT in 6 weeks  4.  PVCs  -He is asymptomatic  -Continue Toprol-XL 50 mg daily mexiletine 300 mg twice daily    Medication Adjustments/Labs and Tests Ordered: Current medicines are reviewed at length with the patient today.  Concerns regarding medicines are outlined above.  No orders of the defined types were placed in this encounter.  No orders of the defined types were placed in this encounter.   Signed, Armanda Magic, MD  12/23/2022 8:30 AM    [facility]

## 2022-12-23 NOTE — Patient Instructions (Signed)
Medication Instructions:  Please INCREASE your atorvastatin dose from 40 mg daily to 80 mg daily.  *If you need a refill on your cardiac medications before your next appointment, please call your pharmacy*   Lab Work: None.  If you have labs (blood work) drawn today and your tests are completely normal, you will receive your results only by: MyChart Message (if you have MyChart) OR A paper copy in the mail If you have any lab test that is abnormal or we need to change your treatment, we will call you to review the results.   Testing/Procedures: None.   Follow-Up:   Your next appointment:   1 year(s)  Provider:   Armanda Magic, MD

## 2022-12-23 NOTE — Addendum Note (Signed)
Addended by: Luellen Pucker on: 12/23/2022 08:41 AM   Modules accepted: Orders

## 2022-12-27 ENCOUNTER — Other Ambulatory Visit: Payer: Self-pay

## 2022-12-27 MED ORDER — ATORVASTATIN CALCIUM 80 MG PO TABS: 80.0000 mg | ORAL_TABLET | Freq: Every day | ORAL | 3 refills | Status: AC

## 2023-01-16 ENCOUNTER — Encounter (INDEPENDENT_AMBULATORY_CARE_PROVIDER_SITE_OTHER): Payer: Medicare HMO | Admitting: Ophthalmology

## 2023-01-16 DIAGNOSIS — H35373 Puckering of macula, bilateral: Secondary | ICD-10-CM

## 2023-01-16 DIAGNOSIS — H35033 Hypertensive retinopathy, bilateral: Secondary | ICD-10-CM | POA: Diagnosis not present

## 2023-01-16 DIAGNOSIS — H59032 Cystoid macular edema following cataract surgery, left eye: Secondary | ICD-10-CM

## 2023-01-16 DIAGNOSIS — I10 Essential (primary) hypertension: Secondary | ICD-10-CM | POA: Diagnosis not present

## 2023-01-16 DIAGNOSIS — H43813 Vitreous degeneration, bilateral: Secondary | ICD-10-CM

## 2023-01-28 DIAGNOSIS — G459 Transient cerebral ischemic attack, unspecified: Secondary | ICD-10-CM

## 2023-01-28 HISTORY — DX: Transient cerebral ischemic attack, unspecified: G45.9

## 2023-02-13 ENCOUNTER — Emergency Department (HOSPITAL_COMMUNITY): Payer: Medicare HMO

## 2023-02-13 ENCOUNTER — Encounter (HOSPITAL_COMMUNITY): Payer: Self-pay

## 2023-02-13 ENCOUNTER — Observation Stay (HOSPITAL_COMMUNITY)
Admission: EM | Admit: 2023-02-13 | Discharge: 2023-02-14 | Disposition: A | Discharge status: Home / Self Care | Payer: Medicare HMO | Attending: Family Medicine | Admitting: Family Medicine

## 2023-02-13 ENCOUNTER — Other Ambulatory Visit: Payer: Self-pay

## 2023-02-13 ENCOUNTER — Telehealth: Payer: Self-pay | Admitting: Cardiology

## 2023-02-13 DIAGNOSIS — Z955 Presence of coronary angioplasty implant and graft: Secondary | ICD-10-CM | POA: Insufficient documentation

## 2023-02-13 DIAGNOSIS — R5383 Other fatigue: Secondary | ICD-10-CM | POA: Diagnosis not present

## 2023-02-13 DIAGNOSIS — K118 Other diseases of salivary glands: Secondary | ICD-10-CM | POA: Insufficient documentation

## 2023-02-13 DIAGNOSIS — R4781 Slurred speech: Secondary | ICD-10-CM | POA: Diagnosis present

## 2023-02-13 DIAGNOSIS — Z96652 Presence of left artificial knee joint: Secondary | ICD-10-CM | POA: Diagnosis not present

## 2023-02-13 DIAGNOSIS — E039 Hypothyroidism, unspecified: Secondary | ICD-10-CM | POA: Diagnosis not present

## 2023-02-13 DIAGNOSIS — I498 Other specified cardiac arrhythmias: Secondary | ICD-10-CM

## 2023-02-13 DIAGNOSIS — Z79899 Other long term (current) drug therapy: Secondary | ICD-10-CM | POA: Insufficient documentation

## 2023-02-13 DIAGNOSIS — R008 Other abnormalities of heart beat: Secondary | ICD-10-CM | POA: Insufficient documentation

## 2023-02-13 DIAGNOSIS — R4789 Other speech disturbances: Secondary | ICD-10-CM | POA: Diagnosis not present

## 2023-02-13 DIAGNOSIS — E785 Hyperlipidemia, unspecified: Secondary | ICD-10-CM | POA: Diagnosis present

## 2023-02-13 DIAGNOSIS — G459 Transient cerebral ischemic attack, unspecified: Principal | ICD-10-CM

## 2023-02-13 DIAGNOSIS — Z8546 Personal history of malignant neoplasm of prostate: Secondary | ICD-10-CM | POA: Diagnosis not present

## 2023-02-13 DIAGNOSIS — I5042 Chronic combined systolic (congestive) and diastolic (congestive) heart failure: Secondary | ICD-10-CM | POA: Diagnosis not present

## 2023-02-13 DIAGNOSIS — R001 Bradycardia, unspecified: Secondary | ICD-10-CM | POA: Insufficient documentation

## 2023-02-13 DIAGNOSIS — I2510 Atherosclerotic heart disease of native coronary artery without angina pectoris: Secondary | ICD-10-CM | POA: Diagnosis present

## 2023-02-13 DIAGNOSIS — I110 Hypertensive heart disease with heart failure: Secondary | ICD-10-CM | POA: Diagnosis not present

## 2023-02-13 DIAGNOSIS — R479 Unspecified speech disturbances: Secondary | ICD-10-CM

## 2023-02-13 DIAGNOSIS — I10 Essential (primary) hypertension: Secondary | ICD-10-CM | POA: Diagnosis present

## 2023-02-13 DIAGNOSIS — Z7902 Long term (current) use of antithrombotics/antiplatelets: Secondary | ICD-10-CM | POA: Diagnosis not present

## 2023-02-13 LAB — CBC
HCT: 45.8 % (ref 39.0–52.0)
Hemoglobin: 15.1 g/dL (ref 13.0–17.0)
MCH: 32.5 pg (ref 26.0–34.0)
MCHC: 33 g/dL (ref 30.0–36.0)
MCV: 98.7 fL (ref 80.0–100.0)
Platelets: 152 10*3/uL (ref 150–400)
RBC: 4.64 MIL/uL (ref 4.22–5.81)
RDW: 14.1 % (ref 11.5–15.5)
WBC: 7.3 10*3/uL (ref 4.0–10.5)
nRBC: 0 % (ref 0.0–0.2)

## 2023-02-13 LAB — TSH: TSH: 2.589 u[IU]/mL (ref 0.350–4.500)

## 2023-02-13 LAB — CK: Total CK: 79 U/L (ref 49–397)

## 2023-02-13 LAB — HEPATIC FUNCTION PANEL
ALT: 34 U/L (ref 0–44)
AST: 23 U/L (ref 15–41)
Albumin: 3.5 g/dL (ref 3.5–5.0)
Alkaline Phosphatase: 54 U/L (ref 38–126)
Bilirubin, Direct: 0.3 mg/dL — ABNORMAL HIGH (ref 0.0–0.2)
Indirect Bilirubin: 1.3 mg/dL — ABNORMAL HIGH (ref 0.3–0.9)
Total Bilirubin: 1.6 mg/dL — ABNORMAL HIGH (ref 0.3–1.2)
Total Protein: 6.3 g/dL — ABNORMAL LOW (ref 6.5–8.1)

## 2023-02-13 LAB — BASIC METABOLIC PANEL
Anion gap: 9 (ref 5–15)
BUN: 18 mg/dL (ref 8–23)
CO2: 23 mmol/L (ref 22–32)
Calcium: 8.8 mg/dL — ABNORMAL LOW (ref 8.9–10.3)
Chloride: 107 mmol/L (ref 98–111)
Creatinine, Ser: 1.08 mg/dL (ref 0.61–1.24)
GFR, Estimated: >60 mL/min (ref >60)
Glucose, Bld: 82 mg/dL (ref 70–99)
Potassium: 3.8 mmol/L (ref 3.5–5.1)
Sodium: 139 mmol/L (ref 135–145)

## 2023-02-13 LAB — URINALYSIS, ROUTINE W REFLEX MICROSCOPIC
Bilirubin Urine: NEGATIVE
Glucose, UA: NEGATIVE mg/dL
Hgb urine dipstick: NEGATIVE
Ketones, ur: NEGATIVE mg/dL
Leukocytes,Ua: NEGATIVE
Nitrite: NEGATIVE
Protein, ur: NEGATIVE mg/dL
Specific Gravity, Urine: 1.025 (ref 1.005–1.030)
pH: 5 (ref 5.0–8.0)

## 2023-02-13 LAB — AMMONIA: Ammonia: 20 umol/L (ref 9–35)

## 2023-02-13 LAB — CBG MONITORING, ED: Glucose-Capillary: 101 mg/dL — ABNORMAL HIGH (ref 70–99)

## 2023-02-13 LAB — MAGNESIUM: Magnesium: 2.1 mg/dL (ref 1.7–2.4)

## 2023-02-13 MED ORDER — IOHEXOL 350 MG/ML SOLN
75.0000 mL | Freq: Once | INTRAVENOUS | Status: AC | PRN
  Administered 2023-02-13: 75 mL via INTRAVENOUS

## 2023-02-13 MED ORDER — ASPIRIN 81 MG PO TBEC
81.0000 mg | DELAYED_RELEASE_TABLET | Freq: Every day | ORAL | Status: DC
  Administered 2023-02-14: 81 mg via ORAL
  Filled 2023-02-13: qty 1

## 2023-02-13 NOTE — Consult Note (Signed)
NEUROLOGY CONSULTATION NOTE   Date of service: February 13, 2023 Patient Name: Duane Reyes MRN:  [id number] DOB:  07/08/1951 Reason for consult: "episode of aphasia vs confusion concerning for possible TIA" Requesting Provider: Heide Scales, * _ _ _   _ __   _ __ _ _  __ __   _ __   __ _  History of Present Illness  Duane Reyes is a 72 y.o. male with PMH significant for CHF, GERD, HTN, HLD, hypothyroidism, PVCs, GERD, chronic DOE, who presents with episode of word finding difficulty.  Reports was fine this AM, was hot outside but still decided to try to mow his lawn. Was outside for an hour and setting up his lawnmower. Did not start mowing his lawn. Felt off and tired and came back inside and sat down in a chair. Was not short of breath, no chest pain, palpitations. Denies any lightheadedness. Sat down and checked his BP with systolics in 120s with HR ins 60s. Called his girlfriend and was having trouble with speech. Knew what he wanted to say but taking pauses, had to think to find the right words and tried to say one thing and something else would come out of his mouth. Did not feel he was confused. His speech was also very slurry. Girlfriend drove over and that took about 35 mins and the episode had resolved by then. He feels that it likely lasted about 20 mins but he is not sure since he was by himself in the house.  Denies any prior similar episodes, with this heart, typically feels short of breath but not like this. He  did feel he was hot but was inside house for over 30 mins before this occurred.  No prior stroke, no family hx of strokes, does not smoke, no ETOH. No hx of Afibb.  In the ED, monitor with bigeminy. ED team running by cardiology.  LKW: 1200 on 02/13/23. mRS: 0 tNKASE: not offered 2/2 resolution of symptoms. Thrombectomy: not offered 2/2 resolution of symptoms. NIHSS components Score: Comment  1a Level of Conscious 0[x]  1[]  2[]  3[]      1b LOC Questions  0[x]  1[]  2[]       1c LOC Commands 0[x]  1[]  2[]       2 Best Gaze 0[x]  1[]  2[]       3 Visual 0[x]  1[]  2[]  3[]      4 Facial Palsy 0[x]  1[]  2[]  3[]      5a Motor Arm - left 0[x]  1[]  2[]  3[]  4[]  UN[]    5b Motor Arm - Right 0[x]  1[]  2[]  3[]  4[]  UN[]    6a Motor Leg - Left 0[x]  1[]  2[]  3[]  4[]  UN[]    6b Motor Leg - Right 0[x]  1[]  2[]  3[]  4[]  UN[]    7 Limb Ataxia 0[x]  1[]  2[]  3[]  UN[]     8 Sensory 0[x]  1[]  2[]  UN[]      9 Best Language 0[x]  1[]  2[]  3[]      10 Dysarthria 0[x]  1[]  2[]  UN[]      11 Extinct. and Inattention 0[x]  1[]  2[]       TOTAL: 0          ROS   Constitutional Denies weight loss, fever and chills.   HEENT Denies changes in vision and hearing.   Respiratory Denies SOB and cough.   CV Denies palpitations and CP   GI Denies abdominal pain, nausea, vomiting and diarrhea.   GU Denies dysuria and urinary frequency.   MSK Denies myalgia and joint pain.  Skin Denies rash and pruritus.   Neurological Denies headache and syncope.   Psychiatric Denies recent changes in mood. Denies anxiety and depression.    Past History   Past Medical History:  Diagnosis Date   Arthritis    "left knee" (01/29/2016)   Bradycardia, drug induced 10/26/2016   Bronchiolitis    CAD in native artery    a. Abnl cardiac CT ; b. LHC 01/2016 s/p 50% mLAD, 25% OM2, 95% RPDA which was treated with overlapping DES; c. MV 03/2021 negative for ischemia   Chronic combined systolic and diastolic heart failure (HCC)    HFmrEF, echo 03/2021: EF 45-50%, GIDD.   Chronic knee pain    Chronic lower back pain    DOE (dyspnea on exertion)    Dyspnea    Frequent PVCs    a. 12/2015 - Holter showed SB, NSR, ST, HR 54-108, with frequent PVCs in singles, couplets, and bigemy, with elevated PVC load 24%.   GERD (gastroesophageal reflux disease)    History of shingles    Hyperlipidemia    Hypertension    Hypothyroidism    Prostate cancer (HCC)    Scoliosis    Spinal stenosis    Spondylolisthesis of lumbar region     Tortuosity of aortic arch    Tortuosity of aortic arch, -would not pursue Rt radial approach in this patient for future cardiac caths    Past Surgical History:  Procedure Laterality Date   ANGIOPLASTY     BACK SURGERY     CARDIAC CATHETERIZATION N/A 01/29/2016   Procedure: Right/Left Heart Cath and Coronary Angiography;  Surgeon: Corky Crafts, MD;  Location: [facility] INVASIVE CV LAB;  Service: Cardiovascular;  Laterality: N/A;   CARDIAC CATHETERIZATION N/A 01/29/2016   Procedure: Coronary Stent Intervention 2.5/16mm Synergy-proximal PDA;  Surgeon: Corky Crafts, MD;  Location: [facility] INVASIVE CV LAB;  Service: Cardiovascular;  Laterality: N/A;   CATARACT EXTRACTION W/ INTRAOCULAR LENS IMPLANT Left 11/2015   COLONOSCOPY     CORONARY ANGIOPLASTY WITH STENT PLACEMENT  01/29/2016   "2 stents"   CYST EXCISION Right 2005   "near bicep"   ESOPHAGOGASTRODUODENOSCOPY     EYE SURGERY     INGUINAL HERNIA REPAIR     JOINT REPLACEMENT     KNEE ARTHROSCOPY Left 2012   "torn meniscus"   POSTERIOR LUMBAR FUSION  02/2013   "L4-5; hardware in place"   PROSTATE BIOPSY  2013   PROSTATE SURGERY  2013   "brachytherapy for prostate radiation"   TONSILLECTOMY  1950s   TOTAL KNEE ARTHROPLASTY Left 12/18/2017   TOTAL KNEE ARTHROPLASTY Left 12/18/2017   Procedure: TOTAL KNEE ARTHROPLASTY;  Surgeon: Dannielle Huh, MD;  Location: [facility] OR;  Service: Orthopedics;  Laterality: Left;   Family History  Problem Relation Age of Onset   Heart attack Father    Heart disease Father    Social History   Socioeconomic History   Marital status: Married    Spouse name: Not on file   Number of children: Not on file   Years of education: Not on file   Highest education level: Not on file  Occupational History   Not on file  Tobacco Use   Smoking status: Never   Smokeless tobacco: Never  Vaping Use   Vaping Use: Never used  Substance and Sexual Activity   Alcohol use: Yes    Comment: 01/29/2016 "glass of wine q couple  months"   Drug use: No   Sexual activity: Not Currently  Other Topics Concern   Not on file  Social History Narrative   Not on file   Social Determinants of Health   Financial Resource Strain: Not on file  Food Insecurity: Not on file  Transportation Needs: Not on file  Physical Activity: Not on file  Stress: Not on file  Social Connections: Not on file   Allergies  Allergen Reactions   Topiramate Anxiety, Palpitations and Other (See Comments)    blurred vision, tired, ringing in ears, restlessness, confusion, rapid heart rate, made him jittery   Celecoxib Other (See Comments)    GI Upset; did not mix with his Nexium/Caused acid relux     Pitavastatin Rash and Cough    LIVALO    Rosuvastatin Calcium Other (See Comments)    Muscle aches    Hydrocodone-Acetaminophen Other (See Comments)    Delusions     Medications  (Not in a hospital admission)    Vitals   Vitals:   02/13/23 1834 02/13/23 1930 02/13/23 2000 02/13/23 2130  BP: (!) 158/68 113/62 130/78 (!) 145/79  Pulse: 71 62 62 69  Resp: 19 16 20 19   Temp: 98.9 F (37.2 C)     TempSrc: Oral     SpO2: 96% 96% 98% 98%  Weight:      Height:         Body mass index is 25.77 kg/m.  Physical Exam   General: Laying comfortably in bed; in no acute distress.  HENT: Normal oropharynx and mucosa. Normal external appearance of ears and nose.  Neck: Supple, no pain or tenderness  CV: No JVD. No peripheral edema.  Pulmonary: Symmetric Chest rise. Normal respiratory effort.  Abdomen: Soft to touch, non-tender.  Ext: No cyanosis, edema, or deformity  Skin: No rash. Normal palpation of skin.   Musculoskeletal: Normal digits and nails by inspection. No clubbing.   Neurologic Examination  Mental status/Cognition: Alert, oriented to self, place, month and year, good attention.  Speech/language: Fluent, comprehension intact, object naming intact, repetition intact.  Cranial nerves:   CN II Pupils equal and reactive  to light, no VF deficits    CN III,IV,VI EOM intact, no gaze preference or deviation, no nystagmus    CN V normal sensation in V1, V2, and V3 segments bilaterally    CN VII no asymmetry, no nasolabial fold flattening    CN VIII normal hearing to speech    CN IX & X normal palatal elevation, no uvular deviation    CN XI 5/5 head turn and 5/5 shoulder shrug bilaterally    CN XII midline tongue protrusion    Motor:  Muscle bulk: normal, tone normal, pronator drift none tremor none Mvmt Root Nerve  Muscle Right Left Comments  SA C5/6 Ax Deltoid 5 5   EF C5/6 Mc Biceps 5 5   EE C6/7/8 Rad Triceps 5 5   WF C6/7 Med FCR     WE C7/8 PIN ECU     F Ab C8/T1 U ADM/FDI 5 5   HF L1/2/3 Fem Illopsoas 5 5   KE L2/3/4 Fem Quad 5 5   DF L4/5 D Peron Tib Ant 5 5   PF S1/2 Tibial Grc/Sol 5 5    Sensation:  Light touch Intact throughout   Pin prick    Temperature    Vibration   Proprioception    Coordination/Complex Motor:  - Finger to Nose intact BL - Heel to shin intact BL - Rapid alternating movement are normal - Gait:  deferred.  Labs   CBC:  Recent Labs  Lab 02/13/23 1520  WBC 7.3  HGB 15.1  HCT 45.8  MCV 98.7  PLT 152    Basic Metabolic Panel:  Lab Results  Component Value Date   NA 139 02/13/2023   K 3.8 02/13/2023   CO2 23 02/13/2023   GLUCOSE 82 02/13/2023   BUN 18 02/13/2023   CREATININE 1.08 02/13/2023   CALCIUM 8.8 (L) 02/13/2023   GFRNONAA >60 02/13/2023   GFRAA >60 07/17/2018   Lipid Panel:  Lab Results  Component Value Date   LDLCALC 73 10/26/2016   HgbA1c:  Lab Results  Component Value Date   HGBA1C 5.8 (H) 04/29/2015   Urine Drug Screen: No results found for: "LABOPIA", "COCAINSCRNUR", "LABBENZ", "AMPHETMU", "THCU", "LABBARB"  Alcohol Level No results found for: "ETH" CT angio Head and Neck with contrast: pending  MRI Brain(Personally reviewed): No acute abnormalities, 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  Impression   Duane Reyes is a 72 y.o. male   with PMH significant for CHF, GERD, HTN, HLD, hypothyroidism, PVCs, GERD, chronic DOE, who presents with episode of word finding difficulty.  Could be aphasia vs potential heat exhaustion. He is very clear that this was more of an isolated language issues more so than confusion. I am leaning towards treating this as a TIA. Althou, given the circumstances around his presentation specially with the hot weather today, this episode could very well have been from heat exhaustion or potentially cardiac.  Recommendations  - Frequent Neuro checks per stroke unit protocol - Recommend brain imaging with MRI Brain without contrast - Recommend Vascular imaging with CT angio head and neck - Recommend obtaining TTE - Recommend obtaining Lipid panel with LDL - Please start statin if LDL > 70 - Recommend HbA1c to evaluate for diabetes and how well it is controlled. - Antithrombotic - Aspirin 81mg  daily along with plavix 75mg  daily for 21 days, followed by plavix 75mg  daiyl alone. - Recommend DVT ppx - SBP goal - permissive hypertension first 24 h < 220/110. Held home meds.  - Recommend Telemetry monitoring for arrythmia - Recommend bedside swallow screen prior to PO intake. - Stroke education booklet - Recommend PT/OT/SLP consult - recommend cardiology consult for evaluation for potential cardiac etiology of his presentation. - orthostatic vitals x 1. ______________________________________________________________________   Thank you for the opportunity to take part in the care of this patient. If you have any further questions, please contact the neurology consultation attending.  Signed,  Erick Blinks [facility] _ _ _   _ __   _ __ _ _  __ __   _ __   __ _

## 2023-02-13 NOTE — ED Provider Notes (Signed)
[place] EMERGENCY DEPARTMENT AT [facility] Provider Note   CSN: [id number] Arrival date & time: 02/13/23  1510     History  Chief Complaint  Patient presents with   Feeling Jittery   Difficulty Speaking    Duane Reyes is a 72 y.o. male.  The history is provided by the patient and medical records. No language interpreter was used.  Neurologic Problem This is a new problem. The current episode started 6 to 12 hours ago. The problem occurs rarely. The problem has been resolved. Pertinent negatives include no chest pain, no abdominal pain, no headaches and no shortness of breath. Nothing aggravates the symptoms. Nothing relieves the symptoms. He has tried nothing for the symptoms. The treatment provided no relief.       Home Medications Prior to Admission medications   Medication Sig Start Date End Date Taking? Authorizing Provider  acetaminophen (TYLENOL) 500 MG tablet Take 500 mg by mouth every 6 (six) hours as needed.    [provider]  albuterol (PROAIR HFA) 108 (90 Base) MCG/ACT inhaler Inhale 2 puffs into the lungs every 6 (six) hours as needed (for wheezing or shortness of breath). 10/07/21   Leslye Peer, MD  atorvastatin (LIPITOR) 80 MG tablet Take 1 tablet (80 mg total) by mouth daily. 12/27/22   Quintella Reichert, MD  baclofen (LIORESAL) 10 MG tablet Take 10 mg by mouth as needed. 11/22/21   [provider]  budesonide-formoterol (SYMBICORT) 80-4.5 MCG/ACT inhaler Inhale 2 puffs into the lungs 2 (two) times daily as needed (for flares).  12/18/17   [provider]  Cholecalciferol (VITAMIN D-3 PO) Take 1 capsule by mouth 2 (two) times a week.    [provider]  clopidogrel (PLAVIX) 75 MG tablet TAKE 1 TABLET DAILY WITH BREAKFAST 05/03/22   Camnitz, Andree Coss, MD  Coenzyme Q10 (CO Q-10) 120 MG CAPS Take 120 mg by mouth 3 (three) times daily.    [provider]  diclofenac Sodium (VOLTAREN) 1 % GEL Apply 1  application. topically. 3-4 times daily 10/11/21   [provider]  gabapentin (NEURONTIN) 300 MG capsule Take by mouth. 10/10/22   [provider]  hydrocortisone 2.5 % cream Apply topically as needed. 06/07/21   [provider]  levothyroxine (SYNTHROID, LEVOTHROID) 125 MCG tablet Take 125 mcg by mouth daily before breakfast.    [provider]  metoprolol succinate (TOPROL-XL) 50 MG 24 hr tablet TAKE 1 TABLET EVERY DAY WITH OR IMMEDIATELY FOLLOWING A MEAL. DISCONTINUE VERAPAMIL 09/29/22   Camnitz, Andree Coss, MD  mexiletine (MEXITIL) 150 MG capsule Take 2 capsules (300 mg total) by mouth 2 (two) times daily. 10/11/22   Camnitz, Will Daphine Deutscher, MD  montelukast (SINGULAIR) 10 MG tablet Take by mouth daily. 04/26/22   [provider]  Multiple Vitamins-Minerals (PRESERVISION AREDS 2 PO) Take 1 capsule by mouth 2 (two) times daily.    [provider]  nitroGLYCERIN (NITROSTAT) 0.4 MG SL tablet Place 1 tablet (0.4 mg total) under the tongue every 5 (five) minutes as needed for chest pain (x 3 doses). 02/10/22   Camnitz, Will Daphine Deutscher, MD  pantoprazole (PROTONIX) 40 MG tablet Take 1 tablet (40 mg total) by mouth daily. 08/21/19   Camnitz, Andree Coss, MD  prednisoLONE acetate (PRED FORTE) 1 % ophthalmic suspension Place 1 drop into the left eye 4 (four) times daily.    [provider]  sildenafil (REVATIO) 20 MG tablet Take 1-5 tablets by mouth daily  as needed. 03/04/22   [provider]      Allergies    Topiramate, Celecoxib, Pitavastatin, Rosuvastatin calcium, and Hydrocodone-acetaminophen    Review of Systems   Review of Systems  Constitutional:  Positive for fatigue. Negative for chills, diaphoresis, fever and unexpected weight change.  HENT:  Negative for congestion.   Eyes:  Negative for visual disturbance.  Respiratory:  Negative for cough, chest tightness, shortness of breath and wheezing.   Cardiovascular:  Negative for chest  pain, palpitations and leg swelling.  Gastrointestinal:  Negative for abdominal pain, constipation, diarrhea, nausea and vomiting.  Genitourinary:  Negative for dysuria and flank pain.  Musculoskeletal:  Negative for back pain, neck pain and neck stiffness.  Skin:  Negative for rash and wound.  Neurological:  Positive for speech difficulty. Negative for weakness, light-headedness, numbness and headaches.  Psychiatric/Behavioral:  Negative for agitation.   All other systems reviewed and are negative.   Physical Exam Updated Vital Signs BP (!) 158/68 (BP Location: Right Arm)   Pulse 71   Temp 98.9 F (37.2 C) (Oral)   Resp 19   Ht 5' 7.5" (1.715 m)   Wt 75.8 kg   SpO2 96%   BMI 25.77 kg/m  Physical Exam Vitals and nursing note reviewed.  Constitutional:      General: He is not in acute distress.    Appearance: He is well-developed. He is not ill-appearing, toxic-appearing or diaphoretic.  HENT:     Head: Normocephalic and atraumatic.     Nose: Nose normal.     Mouth/Throat:     Mouth: Mucous membranes are moist.     Pharynx: No oropharyngeal exudate or posterior oropharyngeal erythema.  Eyes:     Extraocular Movements: Extraocular movements intact.     Conjunctiva/sclera: Conjunctivae normal.     Pupils: Pupils are equal, round, and reactive to light.  Neck:     Vascular: No carotid bruit.  Cardiovascular:     Rate and Rhythm: Normal rate and regular rhythm.     Heart sounds: No murmur heard. Pulmonary:     Effort: Pulmonary effort is normal. No respiratory distress.     Breath sounds: Normal breath sounds. No wheezing, rhonchi or rales.  Chest:     Chest wall: No tenderness.  Abdominal:     General: Abdomen is flat.     Palpations: Abdomen is soft.     Tenderness: There is no abdominal tenderness. There is no guarding or rebound.  Musculoskeletal:        General: No swelling or tenderness.     Cervical back: Neck supple. No tenderness.     Right lower leg: No  edema.     Left lower leg: No edema.  Skin:    General: Skin is warm and dry.     Capillary Refill: Capillary refill takes less than 2 seconds.     Findings: No erythema or rash.  Neurological:     Mental Status: He is alert.     Sensory: No sensory deficit.     Motor: No weakness.  Psychiatric:        Mood and Affect: Mood normal.     ED Results / Procedures / Treatments   Labs (all labs ordered are listed, but only abnormal results are displayed) Labs Reviewed  BASIC METABOLIC PANEL - Abnormal; Notable for the following components:      Result Value   Calcium 8.8 (*)    All other components within normal limits  HEPATIC FUNCTION PANEL - Abnormal; Notable for the following components:   Total Protein 6.3 (*)    Total Bilirubin 1.6 (*)    Bilirubin, Direct 0.3 (*)    Indirect Bilirubin 1.3 (*)    All other components within normal limits  CBG MONITORING, ED - Abnormal; Notable for the following components:   Glucose-Capillary 101 (*)    All other components within normal limits  CBC  URINALYSIS, ROUTINE W REFLEX MICROSCOPIC  AMMONIA  CK  TSH    EKG EKG Interpretation  Date/Time:  Monday February 13 2023 22:05:08 EDT Ventricular Rate:  85 PR Interval:  210 QRS Duration: 95 QT Interval:  373 QTC Calculation: 354 R Axis:   -47 Text Interpretation: Sinus rhythm Ventricular bigeminy Inferior infarct, old Probable anterior infarct, age indeterminate when comapred top rior, now bigeminy. No STEMI Confirmed by Theda Belfast ([id number]) on 02/14/2023 12:00:05 AM  EKG Interpretation  Date/Time:  Monday February 13 2023 15:10:21 EDT Ventricular Rate:  59 PR Interval:  202 QRS Duration: 86 QT Interval:  390 QTC Calculation: 386 R Axis:   -38 Text Interpretation: Sinus bradycardia Left axis deviation Nonspecific T wave abnormality Abnormal ECG When compared with ECG of 15-Sep-2020 14:12, PREVIOUS ECG IS PRESENT wen comapred to prior, overall similar no STEMI Confirmed by Theda Belfast ([id number]) on 02/13/2023 7:48:46 PM   Radiology MR BRAIN WO CONTRAST  Result Date: 02/13/2023 CLINICAL DATA:  Aphasia EXAM: MRI HEAD WITHOUT CONTRAST TECHNIQUE: Multiplanar, multiecho pulse sequences of the brain and surrounding structures were obtained without intravenous contrast. COMPARISON:  None Available. FINDINGS: Brain: No acute infarct, mass effect or extra-axial collection. Fewer than 5 scattered microhemorrhages in a nonspecific pattern. There is multifocal hyperintense T2-weighted signal within the white matter. Generalized volume loss. 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 Normal midline structures. Vascular: Major flow voids are preserved. Skull and upper cervical spine: Normal calvarium and skull base. Visualized upper cervical spine and soft tissues are normal. Sinuses/Orbits:Chronic left maxillary sinusitis.  Normal orbits. IMPRESSION: 1. No acute intracranial abnormality. 2. 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 Electronically Signed   By: Deatra Robinson M.D.   On: 02/13/2023 21:52   DG Chest 2 View  Result Date: 02/13/2023 CLINICAL DATA:  Fatigue, lightheaded, rule out infection. Weakness and jittery EXAM: CHEST - 2 VIEW COMPARISON:  CT chest 06/09/2021 and radiograph 09/15/2020 FINDINGS: Stable cardiomediastinal silhouette. Aortic atherosclerotic calcification. Left basilar atelectasis. No focal consolidation, pleural effusion, or pneumothorax. Severe dextroscoliosis, unchanged. IMPRESSION: No active cardiopulmonary disease. Electronically Signed   By: Minerva Fester M.D.   On: 02/13/2023 20:39    Procedures Procedures    Medications Ordered in ED Medications  aspirin EC tablet 81 mg (has no administration in time range)  iohexol (OMNIPAQUE) 350 MG/ML injection 75 mL (75 mLs Intravenous Contrast Given 02/13/23 2358)    ED Course/ Medical Decision Making/ A&P                             Medical Decision Making Amount  and/or Complexity of Data Reviewed Labs: ordered. Radiology: ordered.  Risk Decision regarding hospitalization.    KOHEN CLEMENSEN is a 72 y.o. male with a past medical history significant for hypertension, hyperlipidemia, CAD status post PCI, restrictive lung disease, aortic arch tortuosity, previous prostate cancer, GERD, and spinal stenosis who presents with transient difficulty speaking, abnormal sensation that something "felt off", and jitteriness.  According  to patient, for the last week or 2 he has had difficulty with his speech intermittently.  He reports that precipitated worse today and had about an hour where he was unable to communicate and get words out.  He did not have focal numbness, tingling, or weakness of any extremity nor does he have facial droop.  He was denying vision changes.  He denies any head trauma or other complaints with it.  He reports that he has felt jittery and unsettled and that something was wrong.  He reports that he was going to mail outside report began and felt that something was wrong and he needed to rest and rehydrate.  He denies any associated chest pain, palpitations, or shortness of breath.  He reports feeling generally weak and tired but denies other focal complaints.  Denied nausea, vomiting, constipation, diarrhea, or urinary changes.  Denied any recent medication changes or substance use.   Due to this difficulty with speech, no I have been present for evaluation.  On exam, lungs were clear.  Chest nontender.  Abdomen nontender.  No focal neurologic deficits initially.  Normal gait when he was walking down the hall.  Symmetric smile.  Speech was clear for me.   Given the patient's transient speech difficulty that has been on and off for the last week or 2, I do feel is reasonable to get MRI to rule out stroke or other acute abnormality.  With his feeling of jitteriness/unsettled feeling and that he was overheating outside, will get screening  labs.  X-ray shows no evidence of pneumonia, urinalysis shows no evidence of UTI.  Doubt occult infection.  CBC reassuring without anemia and metabolic panel only showed slight low calcium.  Hepatic function not critically elevated with AST ALT and alk phos.  CK normal, no rhabdo.  TSH normal.  Ammonia normal.  Awaiting results of MRI and then we will reassess.  If workup reassuring, dissipate discharge home to rest and maintain hydration and follow-up with primary doctor.  MRI shows evidence of old stroke.  Given clinical concern for possible TIA, neurology will come see patient and help determine disposition.  Patient now in a bigeminy pattern with rate in the 30s.  He is still feeling fatigued but is not having any speech difficulties.  Unclear if this is the cause of his symptoms earlier but patient is in electrophysiology patient with cardiology and has had MI in the past.  Although he is not having chest pain or focal shortness of breath now, I am concerned about some degree of symptomatic bradycardia.  Will call cardiology and will call for admission.  Neurology will see patient as well for possible TIA.  Will call for admission.  10:34 PM Just spoke to cardiology.  They reviewed the case and feel that the patient has had a high PVC burden in the past and he is likely had this before.  They report that he does see EP and they have been trying different medications to help with the PVCs.  There is a he is on mexiletine.  He agreed with admission to medicine for the possible TIA and the fatigue although he does not suspect that this pseudobradycardia is the only cause of the patient's symptoms.  He said that if the patient continues to have a high PVC burden overnight and into tomorrow during the admission, medicine can consult EP to see if medicines need to be changed.  Will admit to medicine for further management.  Final Clinical Impression(s) / ED Diagnoses Final diagnoses:   Symptomatic bradycardia  Transient speech disturbance  Fatigue, unspecified type  Bigeminy      Clinical Impression: 1. Symptomatic bradycardia   2. Transient speech disturbance   3. Fatigue, unspecified type   4. Bigeminy     Disposition: Admit  This note was prepared with assistance of Dragon voice recognition software. Occasional wrong-word or sound-a-like substitutions may have occurred due to the inherent limitations of voice recognition software.      Owenn Rothermel, Canary Brim, MD 02/14/23 0000

## 2023-02-13 NOTE — Telephone Encounter (Signed)
Called patient back about message. Patient complaining of having low HR 53 and feeling very jittery. Wife stated patient has been having episodes of slurred speech and getting his thoughts right since yesterday. Patient's wife also stated patient has had the jittery feeling for about 2 1/2 months. Patient current HR 63. Patient stated he was unable to sleep last night and he has history of irregular heart rhythm. Patient stated right now they are on their way to the ER. Informed them that going to the ED is a good idea, so they can check and make sure patient has not had a stroke and see what rhythm he is in. Will forward to Dr. Mayford Knife and Dr. Elberta Fortis for advisement.

## 2023-02-13 NOTE — ED Triage Notes (Signed)
Pt came in via POV d/t feeling jittery, heart rate in the 60's (per pt), trouble getting his words out & feeling tired the past few months. A/Ox4, denies pain while in triage.

## 2023-02-13 NOTE — Telephone Encounter (Signed)
Patient c/o Palpitations:  High priority if patient c/o lightheadedness, shortness of breath, or chest pain  How long have you had palpitations/irregular HR/ Afib? Patient states he heart was fluttering for about 15- 20 mins. Are you having the symptoms now? No   Are you currently experiencing lightheadedness, SOB or CP? no  Do you have a history of afib (atrial fibrillation) or irregular heart rhythm? Patient states he has history of irregular heart rhythm.    Have you checked your BP or HR? (document readings if available): BP 123/73 HR 53  Are you experiencing any other symptoms? Patient states he couldn't sleep last night, he didn't fall asleep until after 3am.

## 2023-02-13 NOTE — ED Provider Triage Note (Signed)
Emergency Medicine Provider Triage Evaluation Note  Duane Reyes , a 72 y.o. male  was evaluated in triage.  Pt complains of weakness. For the past few months pt has had bouts of feeling jittery.  Today before going outside to mow pt report feeling extra jittery and weak, wife noticing slurring of words.  Sxs has since improved.  No headache, focal weakness or numbness.  No recent medication changes.  No hx of DM or stroke.  Report HR was low.  Review of Systems  Positive: As above Negative: As above  Physical Exam  There were no vitals taken for this visit. Gen:   Awake, no distress   Resp:  Normal effort  MSK:   Moves extremities without difficulty  Other:    Medical Decision Making  Medically screening exam initiated at 3:17 PM.  Appropriate orders placed.  DUDLEY JOHNSTONE was informed that the remainder of the evaluation will be completed by another provider, this initial triage assessment does not replace that evaluation, and the importance of remaining in the ED until their evaluation is complete.     Fayrene Helper, PA-C 02/13/23 1520

## 2023-02-14 ENCOUNTER — Observation Stay (HOSPITAL_BASED_OUTPATIENT_CLINIC_OR_DEPARTMENT_OTHER): Payer: Medicare HMO

## 2023-02-14 ENCOUNTER — Observation Stay (HOSPITAL_COMMUNITY): Payer: Medicare HMO

## 2023-02-14 DIAGNOSIS — R5383 Other fatigue: Secondary | ICD-10-CM

## 2023-02-14 DIAGNOSIS — I493 Ventricular premature depolarization: Secondary | ICD-10-CM

## 2023-02-14 DIAGNOSIS — R001 Bradycardia, unspecified: Secondary | ICD-10-CM | POA: Diagnosis not present

## 2023-02-14 DIAGNOSIS — G459 Transient cerebral ischemic attack, unspecified: Secondary | ICD-10-CM | POA: Diagnosis not present

## 2023-02-14 DIAGNOSIS — E039 Hypothyroidism, unspecified: Secondary | ICD-10-CM

## 2023-02-14 DIAGNOSIS — K118 Other diseases of salivary glands: Secondary | ICD-10-CM | POA: Insufficient documentation

## 2023-02-14 DIAGNOSIS — R569 Unspecified convulsions: Secondary | ICD-10-CM | POA: Diagnosis not present

## 2023-02-14 DIAGNOSIS — R479 Unspecified speech disturbances: Secondary | ICD-10-CM

## 2023-02-14 DIAGNOSIS — I498 Other specified cardiac arrhythmias: Secondary | ICD-10-CM

## 2023-02-14 DIAGNOSIS — Z8546 Personal history of malignant neoplasm of prostate: Secondary | ICD-10-CM

## 2023-02-14 LAB — COMPREHENSIVE METABOLIC PANEL
ALT: 33 U/L (ref 0–44)
AST: 22 U/L (ref 15–41)
Albumin: 3.2 g/dL — ABNORMAL LOW (ref 3.5–5.0)
Alkaline Phosphatase: 49 U/L (ref 38–126)
Anion gap: 12 (ref 5–15)
BUN: 15 mg/dL (ref 8–23)
CO2: 22 mmol/L (ref 22–32)
Calcium: 8.7 mg/dL — ABNORMAL LOW (ref 8.9–10.3)
Chloride: 104 mmol/L (ref 98–111)
Creatinine, Ser: 0.99 mg/dL (ref 0.61–1.24)
GFR, Estimated: >60 mL/min (ref >60)
Glucose, Bld: 133 mg/dL — ABNORMAL HIGH (ref 70–99)
Potassium: 3.6 mmol/L (ref 3.5–5.1)
Sodium: 138 mmol/L (ref 135–145)
Total Bilirubin: 1.7 mg/dL — ABNORMAL HIGH (ref 0.3–1.2)
Total Protein: 5.8 g/dL — ABNORMAL LOW (ref 6.5–8.1)

## 2023-02-14 LAB — CBC
HCT: 42.6 % (ref 39.0–52.0)
Hemoglobin: 14.4 g/dL (ref 13.0–17.0)
MCH: 33.1 pg (ref 26.0–34.0)
MCHC: 33.8 g/dL (ref 30.0–36.0)
MCV: 97.9 fL (ref 80.0–100.0)
Platelets: 129 10*3/uL — ABNORMAL LOW (ref 150–400)
RBC: 4.35 MIL/uL (ref 4.22–5.81)
RDW: 13.9 % (ref 11.5–15.5)
WBC: 6.3 10*3/uL (ref 4.0–10.5)
nRBC: 0 % (ref 0.0–0.2)

## 2023-02-14 LAB — ECHOCARDIOGRAM COMPLETE
AR max vel: 4.2 cm2
AV Peak grad: 4.7 mmHg
Ao pk vel: 1.08 m/s
Area-P 1/2: 3.03 cm2
Height: 67.5 in
S' Lateral: 2.6 cm
Weight: 2672 oz

## 2023-02-14 LAB — LIPID PANEL
Cholesterol: 117 mg/dL (ref 0–200)
HDL: 44 mg/dL (ref >40)
LDL Cholesterol: 60 mg/dL (ref 0–99)
Total CHOL/HDL Ratio: 2.7 RATIO
Triglycerides: 67 mg/dL (ref <150)
VLDL: 13 mg/dL (ref 0–40)

## 2023-02-14 LAB — HEMOGLOBIN A1C
Hgb A1c MFr Bld: 5.3 % (ref 4.8–5.6)
Mean Plasma Glucose: 105.41 mg/dL

## 2023-02-14 LAB — GLUCOSE, CAPILLARY: Glucose-Capillary: 131 mg/dL — ABNORMAL HIGH (ref 70–99)

## 2023-02-14 MED ORDER — SENNOSIDES-DOCUSATE SODIUM 8.6-50 MG PO TABS: 1.0000 | ORAL_TABLET | Freq: Every evening | ORAL | Status: DC | PRN

## 2023-02-14 MED ORDER — MEXILETINE HCL 150 MG PO CAPS
300.0000 mg | ORAL_CAPSULE | Freq: Two times a day (BID) | ORAL | Status: DC
  Administered 2023-02-14: 300 mg via ORAL
  Filled 2023-02-14 (×3): qty 2

## 2023-02-14 MED ORDER — CLOPIDOGREL BISULFATE 75 MG PO TABS
75.0000 mg | ORAL_TABLET | Freq: Every day | ORAL | Status: DC
  Administered 2023-02-14: 75 mg via ORAL
  Filled 2023-02-14: qty 1

## 2023-02-14 MED ORDER — LEVOTHYROXINE SODIUM 25 MCG PO TABS
125.0000 ug | ORAL_TABLET | Freq: Every day | ORAL | Status: DC
  Administered 2023-02-14: 125 ug via ORAL
  Filled 2023-02-14: qty 1

## 2023-02-14 MED ORDER — SODIUM CHLORIDE 0.9 % IV SOLN: INTRAVENOUS | Status: DC

## 2023-02-14 MED ORDER — GABAPENTIN 300 MG PO CAPS
300.0000 mg | ORAL_CAPSULE | Freq: Three times a day (TID) | ORAL | Status: DC
  Administered 2023-02-14 (×2): 300 mg via ORAL
  Filled 2023-02-14 (×2): qty 1

## 2023-02-14 MED ORDER — ACETAMINOPHEN 650 MG RE SUPP: 650.0000 mg | RECTAL | Status: DC | PRN

## 2023-02-14 MED ORDER — ENOXAPARIN SODIUM 40 MG/0.4ML IJ SOSY
40.0000 mg | PREFILLED_SYRINGE | INTRAMUSCULAR | Status: DC
  Administered 2023-02-14: 40 mg via SUBCUTANEOUS
  Filled 2023-02-14: qty 0.4

## 2023-02-14 MED ORDER — GABAPENTIN 300 MG PO CAPS: 300.0000 mg | ORAL_CAPSULE | Freq: Every day | ORAL | Status: DC

## 2023-02-14 MED ORDER — FLUTICASONE FUROATE-VILANTEROL 100-25 MCG/ACT IN AEPB
1.0000 | INHALATION_SPRAY | Freq: Every day | RESPIRATORY_TRACT | Status: DC
  Administered 2023-02-14: 1 via RESPIRATORY_TRACT
  Filled 2023-02-14: qty 28

## 2023-02-14 MED ORDER — ACETAMINOPHEN 325 MG PO TABS: 650.0000 mg | ORAL_TABLET | ORAL | Status: DC | PRN

## 2023-02-14 MED ORDER — ACETAMINOPHEN 160 MG/5ML PO SOLN: 650.0000 mg | ORAL | Status: DC | PRN

## 2023-02-14 MED ORDER — ATORVASTATIN CALCIUM 80 MG PO TABS
80.0000 mg | ORAL_TABLET | Freq: Every day | ORAL | Status: DC
  Administered 2023-02-14: 80 mg via ORAL
  Filled 2023-02-14: qty 1

## 2023-02-14 MED ORDER — HYDRALAZINE HCL 20 MG/ML IJ SOLN: 5.0000 mg | INTRAMUSCULAR | Status: DC | PRN

## 2023-02-14 MED ORDER — PREDNISOLONE ACETATE 1 % OP SUSP
1.0000 [drp] | Freq: Three times a day (TID) | OPHTHALMIC | Status: DC
  Administered 2023-02-14 (×2): 1 [drp] via OPHTHALMIC
  Filled 2023-02-14 (×2): qty 5

## 2023-02-14 MED ORDER — PANTOPRAZOLE SODIUM 40 MG PO TBEC
40.0000 mg | DELAYED_RELEASE_TABLET | Freq: Every day | ORAL | Status: DC
  Administered 2023-02-14: 40 mg via ORAL
  Filled 2023-02-14: qty 1

## 2023-02-14 MED ORDER — STROKE: EARLY STAGES OF RECOVERY BOOK: Freq: Once | Status: DC

## 2023-02-14 MED ORDER — ASPIRIN 81 MG PO TBEC: 81.0000 mg | DELAYED_RELEASE_TABLET | Freq: Every day | ORAL | 12 refills | Status: AC

## 2023-02-14 NOTE — Progress Notes (Signed)
STROKE TEAM PROGRESS NOTE   SUBJECTIVE Patient presented with transient episode of expressive aphasia and word finding difficulties which have resolved.  MRI scan is negative for acute stroke.  CT angiogram shows no large vessel stenosis or occlusion.  LDL cholesterol 60 mg percent and hemoglobin A1c 5.3.  OBJECTIVE Vitals:   02/14/23 0756 02/14/23 1216  BP: 118/75 (!) 112/48  Pulse: 66 66  Resp: 20 15  Temp: 97.9 F (36.6 C) 98.4 F (36.9 C)  SpO2: 99% 97%     Physical Exam Pleasant elderly Caucasian male not in distress.  Afebrile.  Cardiac exam no murmur or gallop.  Lungs clear to auscultation.  Abdomen soft nontender.  Neurological exam Awake alert oriented to time place and person.  Fluent speech without any word finding difficulties, aphasia.  Good naming, repetition and comprehension. Extraocular movements are full range without nystagmus.  Mild decreased hearing bilaterally.  No facial weakness.  Tongue midline. Motor system exam shows symmetric upper and lower extremity strength.  No drift.  No focal weakness. Sensation intact.  Coordination accurate.  Gait not tested.   Pertinent Laboratory Studies (past 3 days) / Diagnostics (past 24h) Recent Labs    02/13/23 1520 02/14/23 0217  WBC 7.3 6.3  HGB 15.1 14.4  PLT 152 129*  NA 139 138  K 3.8 3.6  CREATININE 1.08 0.99  GLUCOSE 82 133*    ECHOCARDIOGRAM COMPLETE  Result Date: 02/14/2023    ECHOCARDIOGRAM REPORT   Patient Name:   Duane Reyes Date of Exam: 02/14/2023 Medical Rec #:  [id number]        Height:       67.5 in Accession #:    [id number]       Weight:       167.0 lb Date of Birth:  [birth year]         BSA:          1.883 m Patient Age:    72 years         BP:           119/60 mmHg Patient Gender: M                HR:           73 bpm. Exam Location:  Inpatient Procedure: 2D Echo, Cardiac Doppler and Color Doppler Indications:    TIA G45.9  History:        Patient has prior history of Echocardiogram  examinations, most                 recent 04/08/2021. CAD, TIA, Arrythmias:Bradycardia and PVC,                 Signs/Symptoms:Dyspnea; Risk Factors:Hypertension and                 Dyslipidemia.  Sonographer:    Lucendia Herrlich Referring Phys: [phone] DEBBY CROSLEY IMPRESSIONS  1. Left ventricular ejection fraction, by estimation, is 45 to 50%. The left ventricle has mildly decreased function. The left ventricle demonstrates regional wall motion abnormalities (see scoring diagram/findings for description). Left ventricular diastolic parameters are consistent with Grade I diastolic dysfunction (impaired relaxation). There is mild hypokinesis of the left ventricular, basal inferior wall.  2. Right ventricular systolic function is normal. The right ventricular size is normal.  3. A small pericardial effusion is present. The pericardial effusion is circumferential.  4. The mitral valve is normal in structure. No evidence of mitral valve regurgitation. No evidence of  mitral stenosis.  5. The aortic valve is tricuspid. There is mild calcification of the aortic valve. There is mild thickening of the aortic valve. Aortic valve regurgitation is not visualized. Aortic valve sclerosis is present, with no evidence of aortic valve stenosis.  6. The inferior vena cava is normal in size with greater than 50% respiratory variability, suggesting right atrial pressure of 3 mmHg. Comparison(s): No significant change from prior study. Prior images reviewed side by side. FINDINGS  Left Ventricle: Left ventricular ejection fraction, by estimation, is 45 to 50%. The left ventricle has mildly decreased function. The left ventricle demonstrates regional wall motion abnormalities. Mild hypokinesis of the left ventricular, basal inferior wall. The left ventricular internal cavity size was normal in size. There is no left ventricular hypertrophy. Left ventricular diastolic parameters are consistent with Grade I diastolic dysfunction (impaired  relaxation). Normal left ventricular filling pressure. Right Ventricle: The right ventricular size is normal. No increase in right ventricular wall thickness. Right ventricular systolic function is normal. Left Atrium: Left atrial size was normal in size. Right Atrium: Right atrial size was normal in size. Pericardium: A small pericardial effusion is present. The pericardial effusion is circumferential. Mitral Valve: The mitral valve is normal in structure. No evidence of mitral valve regurgitation. No evidence of mitral valve stenosis. Tricuspid Valve: The tricuspid valve is normal in structure. Tricuspid valve regurgitation is not demonstrated. No evidence of tricuspid stenosis. Aortic Valve: The aortic valve is tricuspid. There is mild calcification of the aortic valve. There is mild thickening of the aortic valve. Aortic valve regurgitation is not visualized. Aortic valve sclerosis is present, with no evidence of aortic valve stenosis. Aortic valve peak gradient measures 4.7 mmHg. Pulmonic Valve: The pulmonic valve was not well visualized. Pulmonic valve regurgitation is not visualized. No evidence of pulmonic stenosis. Aorta: The aortic root is normal in size and structure. Venous: The inferior vena cava is normal in size with greater than 50% respiratory variability, suggesting right atrial pressure of 3 mmHg. IAS/Shunts: No atrial level shunt detected by color flow Doppler.  LEFT VENTRICLE PLAX 2D LVIDd:         4.30 cm   Diastology LVIDs:         2.60 cm   LV e' medial:    7.77 cm/s LV PW:         0.90 cm   LV E/e' medial:  6.4 LV IVS:        1.00 cm   LV e' lateral:   6.37 cm/s LVOT diam:     2.40 cm   LV E/e' lateral: 7.8 LV SV:         86 LV SV Index:   46 LVOT Area:     4.52 cm  RIGHT VENTRICLE             IVC RV S prime:     13.80 cm/s  IVC diam: 1.50 cm TAPSE (M-mode): 1.8 cm LEFT ATRIUM           Index        RIGHT ATRIUM           Index LA diam:      3.40 cm 1.81 cm/m   RA Area:     14.00 cm LA  Vol (A2C): 23.1 ml 12.26 ml/m  RA Volume:   28.40 ml  15.08 ml/m LA Vol (A4C): 26.2 ml 13.91 ml/m  AORTIC VALVE AV Area (Vmax): 4.20 cm AV Vmax:  108.00 cm/s AV Peak Grad:   4.7 mmHg LVOT Vmax:      100.20 cm/s LVOT Vmean:     63.750 cm/s LVOT VTI:       0.190 m  AORTA Ao Root diam: 3.30 cm Ao Asc diam:  3.20 cm MITRAL VALVE MV Area (PHT): 3.03 cm    SHUNTS MV Decel Time: 250 msec    Systemic VTI:  0.19 m MV E velocity: 49.50 cm/s  Systemic Diam: 2.40 cm MV A velocity: 79.40 cm/s MV E/A ratio:  0.62 Mihai Croitoru MD Electronically signed by Thurmon Fair MD Signature Date/Time: 02/14/2023/2:06:27 PM    Final    CT ANGIO HEAD NECK W WO CM  Result Date: 02/14/2023 CLINICAL DATA:  Transient ischemic attack EXAM: CT ANGIOGRAPHY HEAD AND NECK WITH AND WITHOUT CONTRAST TECHNIQUE: Multidetector CT imaging of the head and neck was performed using the standard protocol during bolus administration of intravenous contrast. Multiplanar CT image reconstructions and MIPs were obtained to evaluate the vascular anatomy. Carotid stenosis measurements (when applicable) are obtained utilizing NASCET criteria, using the distal internal carotid diameter as the denominator. RADIATION DOSE REDUCTION: This exam was performed according to the departmental dose-optimization program which includes automated exposure control, adjustment of the mA and/or kV according to patient size and/or use of iterative reconstruction technique. CONTRAST:  75mL OMNIPAQUE IOHEXOL 350 MG/ML SOLN COMPARISON:  CT cervical spine 09/15/2020 FINDINGS: CT HEAD FINDINGS Brain: There is no mass, hemorrhage or extra-axial collection. The size and configuration of the ventricles and extra-axial CSF spaces are normal. 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 There is hypoattenuation of the periventricular white matter, most commonly indicating chronic ischemic microangiopathy. Skull: The visualized skull base, calvarium and  extracranial soft tissues are normal. Sinuses/Orbits: Moderate left maxillary sinus mucosal thickening. The orbits are normal. CTA NECK FINDINGS SKELETON: There is no bony spinal canal stenosis. No lytic or blastic lesion. 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 UPPER CHEST: No pneumothorax or pleural effusion. No nodules or masses. AORTIC ARCH: There is no calcific atherosclerosis of the aortic arch. There is no aneurysm, dissection or hemodynamically significant stenosis of the visualized portion of the aorta. Conventional 3 vessel aortic branching pattern. The visualized proximal subclavian arteries are widely patent. RIGHT CAROTID SYSTEM: Normal without aneurysm, dissection or stenosis. LEFT CAROTID SYSTEM: Normal without aneurysm, dissection or stenosis. VERTEBRAL ARTERIES: Left dominant configuration. Both origins are clearly patent. There is no dissection, occlusion or flow-limiting stenosis to the skull base (V1-V3 segments). CTA HEAD FINDINGS POSTERIOR CIRCULATION: --Vertebral arteries: Normal V4 segments. --Inferior cerebellar arteries: Normal. --Basilar artery: Normal. --Superior cerebellar arteries: Normal. --Posterior cerebral arteries (PCA): Normal. ANTERIOR CIRCULATION: --Intracranial internal carotid arteries: Normal. --Anterior cerebral arteries (ACA): Normal. Absent left A1 segment, normal variant --Middle cerebral arteries (MCA): Normal. VENOUS SINUSES: As permitted by contrast timing, patent. ANATOMIC VARIANTS: Absent left ACA A1 segment, a common variant. Review of the MIP images confirms the above findings. IMPRESSION: 1. No emergent large vessel occlusion or hemodynamically significant stenosis of the head or neck. 2. 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 3. 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 Based on size, histologic sampling should be considered. Electronically Signed   By: Deatra Robinson M.D.   On: 02/14/2023 00:34   MR  BRAIN WO CONTRAST  Result Date: 02/13/2023 CLINICAL DATA:  Aphasia EXAM: MRI HEAD WITHOUT CONTRAST TECHNIQUE: Multiplanar, multiecho pulse sequences of the brain and surrounding structures were obtained  without intravenous contrast. COMPARISON:  None Available. FINDINGS: Brain: No acute infarct, mass effect or extra-axial collection. Fewer than 5 scattered microhemorrhages in a nonspecific pattern. There is multifocal hyperintense T2-weighted signal within the white matter. Generalized volume loss. 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 Normal midline structures. Vascular: Major flow voids are preserved. Skull and upper cervical spine: Normal calvarium and skull base. Visualized upper cervical spine and soft tissues are normal. Sinuses/Orbits:Chronic left maxillary sinusitis.  Normal orbits. IMPRESSION: 1. No acute intracranial abnormality. 2. 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 Electronically Signed   By: Deatra Robinson M.D.   On: 02/13/2023 21:52   DG Chest 2 View  Result Date: 02/13/2023 CLINICAL DATA:  Fatigue, lightheaded, rule out infection. Weakness and jittery EXAM: CHEST - 2 VIEW COMPARISON:  CT chest 06/09/2021 and radiograph 09/15/2020 FINDINGS: Stable cardiomediastinal silhouette. Aortic atherosclerotic calcification. Left basilar atelectasis. No focal consolidation, pleural effusion, or pneumothorax. Severe dextroscoliosis, unchanged. IMPRESSION: No active cardiopulmonary disease. Electronically Signed   By: Minerva Fester M.D.   On: 02/13/2023 20:39     ASSESSMENT [name] is a 72 y.o. male with  PMH significant for CHF, GERD, HTN, HLD, hypothyroidism, PVCs, GERD, chronic DOE, who presents with episode of word finding difficulty.  Likely left hemispheric TIA.  Neurovascular imaging is unremarkable.   IMPRESSION Likely left hemispheric TIA.  Possibly small vessel disease   RECOMMENDATIONS Check EEG for seizure activity.   Check echocardiogram results.  Recommend aspirin Plavix for 3 weeks followed by aspirin alone and aggressive risk factor modification.  Long discussion with patient and family and answered questions.  Discussed with Dr. Maryfrances Bunnell.  Greater than 50% time during this 50-minute visit was spent on counseling and coordination of care about TIA and discussion about TIA and stroke prevention, evaluation, treatment and answering questions.  Follow-up as an outpatient stroke clinic in 2 months.   Hospital day # 0  Delia Heady, MD [facility] See Amion for Schedule & Pager information 02/14/2023 6:27 PM    To contact Stroke Continuity provider, please refer to [email or web address]. After hours, contact General Neurology

## 2023-02-14 NOTE — Progress Notes (Signed)
EEG complete - results pending 

## 2023-02-14 NOTE — Progress Notes (Signed)
Echocardiogram 2D Echocardiogram has been performed.  Duane Reyes 02/14/2023, 2:00 PM

## 2023-02-14 NOTE — H&P (Addendum)
PCP:   Eartha Inch, MD   Chief Complaint:  Slurred speech  HPI: This is a 72 year old male with past medical history of HTN, HLD, CAD, PVCs, hypothyroidism, chronic back pain, history of prostate cancer.  Today patient was in his garage for approximately an hour preparing to mow his lawn.  When he went out tomorrow, he noted he did not feel good.  He felt jittery.  He also felt it was too hot to mow.  He went inside to cool off.  His girlfriend called and noted his speech was slurred and he was having difficulty putting his thoughts together.  He was a bit confused.  This lasted approximately 20 minutes.  She went over, it took around 35 minutes to get to his home.  His symptoms had resolved by her arrival.  He denies chest pains, fever, chills, headache, nausea, vomiting or localized weakness. New medications include tramadol, started approximately a month prior for chronic back pain and patient's gabapentin was increased to 5 times daily approximately 6 weeks ago.  Patient states he has been compliant with this 5 times days of tablet.  He denies chest pains or palpitations.  He was taken to the ER.  In the ER, CTA head and neck, MRI brain were both normal.  EKG shows normal sinus rhythm.  Per EDP patient was noted to have bigeminy with heart rate read as in the 30s, HR actually 60s. Not captured on EKG.  Review of Systems:  For HPI  Past Medical History: Past Medical History:  Diagnosis Date   Arthritis    "left knee" (01/29/2016)   Bradycardia, drug induced 10/26/2016   Bronchiolitis    CAD in native artery    a. Abnl cardiac CT ; b. LHC 01/2016 s/p 50% mLAD, 25% OM2, 95% RPDA which was treated with overlapping DES; c. MV 03/2021 negative for ischemia   Chronic combined systolic and diastolic heart failure (HCC)    HFmrEF, echo 03/2021: EF 45-50%, GIDD.   Chronic knee pain    Chronic lower back pain    DOE (dyspnea on exertion)    Dyspnea    Frequent PVCs    a. 12/2015 - Holter  showed SB, NSR, ST, HR 54-108, with frequent PVCs in singles, couplets, and bigemy, with elevated PVC load 24%.   GERD (gastroesophageal reflux disease)    History of shingles    Hyperlipidemia    Hypertension    Hypothyroidism    Prostate cancer (HCC)    Scoliosis    Spinal stenosis    Spondylolisthesis of lumbar region    Tortuosity of aortic arch    Tortuosity of aortic arch, -would not pursue Rt radial approach in this patient for future cardiac caths    Past Surgical History:  Procedure Laterality Date   ANGIOPLASTY     BACK SURGERY     CARDIAC CATHETERIZATION N/A 01/29/2016   Procedure: Right/Left Heart Cath and Coronary Angiography;  Surgeon: Corky Crafts, MD;  Location: [facility] INVASIVE CV LAB;  Service: Cardiovascular;  Laterality: N/A;   CARDIAC CATHETERIZATION N/A 01/29/2016   Procedure: Coronary Stent Intervention 2.5/16mm Synergy-proximal PDA;  Surgeon: Corky Crafts, MD;  Location: [facility] INVASIVE CV LAB;  Service: Cardiovascular;  Laterality: N/A;   CATARACT EXTRACTION W/ INTRAOCULAR LENS IMPLANT Left 11/2015   COLONOSCOPY     CORONARY ANGIOPLASTY WITH STENT PLACEMENT  01/29/2016   "2 stents"   CYST EXCISION Right 2005   "near bicep"   ESOPHAGOGASTRODUODENOSCOPY  EYE SURGERY     INGUINAL HERNIA REPAIR     JOINT REPLACEMENT     KNEE ARTHROSCOPY Left 2012   "torn meniscus"   POSTERIOR LUMBAR FUSION  02/2013   "L4-5; hardware in place"   PROSTATE BIOPSY  2013   PROSTATE SURGERY  2013   "brachytherapy for prostate radiation"   TONSILLECTOMY  1950s   TOTAL KNEE ARTHROPLASTY Left 12/18/2017   TOTAL KNEE ARTHROPLASTY Left 12/18/2017   Procedure: TOTAL KNEE ARTHROPLASTY;  Surgeon: Dannielle Huh, MD;  Location: [facility] OR;  Service: Orthopedics;  Laterality: Left;    Medications: Prior to Admission medications   Medication Sig Start Date End Date Taking? Authorizing Provider  acetaminophen (TYLENOL) 500 MG tablet Take 500 mg by mouth every 6 (six) hours as needed.     [provider]  albuterol (PROAIR HFA) 108 (90 Base) MCG/ACT inhaler Inhale 2 puffs into the lungs every 6 (six) hours as needed (for wheezing or shortness of breath). 10/07/21   Leslye Peer, MD  atorvastatin (LIPITOR) 80 MG tablet Take 1 tablet (80 mg total) by mouth daily. 12/27/22   Quintella Reichert, MD  baclofen (LIORESAL) 10 MG tablet Take 10 mg by mouth as needed. 11/22/21   [provider]  budesonide-formoterol (SYMBICORT) 80-4.5 MCG/ACT inhaler Inhale 2 puffs into the lungs 2 (two) times daily as needed (for flares).  12/18/17   [provider]  Cholecalciferol (VITAMIN D-3 PO) Take 1 capsule by mouth 2 (two) times a week.    [provider]  clopidogrel (PLAVIX) 75 MG tablet TAKE 1 TABLET DAILY WITH BREAKFAST 05/03/22   Camnitz, Andree Coss, MD  Coenzyme Q10 (CO Q-10) 120 MG CAPS Take 120 mg by mouth 3 (three) times daily.    [provider]  diclofenac Sodium (VOLTAREN) 1 % GEL Apply 1 application. topically. 3-4 times daily 10/11/21   [provider]  gabapentin (NEURONTIN) 300 MG capsule Take by mouth. 10/10/22   [provider]  hydrocortisone 2.5 % cream Apply topically as needed. 06/07/21   [provider]  levothyroxine (SYNTHROID, LEVOTHROID) 125 MCG tablet Take 125 mcg by mouth daily before breakfast.    [provider]  metoprolol succinate (TOPROL-XL) 50 MG 24 hr tablet TAKE 1 TABLET EVERY DAY WITH OR IMMEDIATELY FOLLOWING A MEAL. DISCONTINUE VERAPAMIL 09/29/22   Camnitz, Andree Coss, MD  mexiletine (MEXITIL) 150 MG capsule Take 2 capsules (300 mg total) by mouth 2 (two) times daily. 10/11/22   Camnitz, Will Daphine Deutscher, MD  montelukast (SINGULAIR) 10 MG tablet Take by mouth daily. 04/26/22   [provider]  Multiple Vitamins-Minerals (PRESERVISION AREDS 2 PO) Take 1 capsule by mouth 2 (two) times daily.    [provider]  nitroGLYCERIN (NITROSTAT) 0.4 MG SL tablet Place 1 tablet (0.4 mg total)  under the tongue every 5 (five) minutes as needed for chest pain (x 3 doses). 02/10/22   Camnitz, Will Daphine Deutscher, MD  pantoprazole (PROTONIX) 40 MG tablet Take 1 tablet (40 mg total) by mouth daily. 08/21/19   Camnitz, Andree Coss, MD  prednisoLONE acetate (PRED FORTE) 1 % ophthalmic suspension Place 1 drop into the left eye 4 (four) times daily.    [provider]  sildenafil (REVATIO) 20 MG tablet Take 1-5 tablets by mouth daily as needed. 03/04/22   [provider]    Allergies:   Allergies  Allergen Reactions   Topiramate Anxiety, Palpitations and Other (See Comments)    blurred vision, tired, ringing in  ears, restlessness, confusion, rapid heart rate, made him jittery   Celecoxib Other (See Comments)    GI Upset; did not mix with his Nexium/Caused acid relux     Pitavastatin Rash and Cough    LIVALO    Rosuvastatin Calcium Other (See Comments)    Muscle aches    Hydrocodone-Acetaminophen Other (See Comments)    Delusions     Social History:  reports that he has never smoked. He has never used smokeless tobacco. He reports current alcohol use. He reports that he does not use drugs.  Family History: Family History  Problem Relation Age of Onset   Heart attack Father    Heart disease Father     Physical Exam: Vitals:   02/13/23 2248 02/13/23 2300 02/13/23 2330 02/14/23 0030  BP:  (!) 144/65 118/65 136/85  Pulse:  (!) 37 (!) 30 67  Resp:   17 18  Temp: (!) 97.5 F (36.4 C)     TempSrc: Oral     SpO2:  97% 98% 97%  Weight:      Height:        General:  Alert and oriented times three, well developed and nourished, no acute distress Eyes: Pink conjunctiva, ENT: Moist oral mucosa,  no thyromegaly Lungs: clear to ascultation, no wheeze, no crackles, no use of accessory muscles Cardiovascular: (Bradycardia HR 51), regular rate and irregular rhythm, no murmurs. No JVD Abdomen: soft, positive BS, non-tender, non-distended, not an acute abdomen GU: not  examined Neuro: CN II - XII grossly intact, sensation intact Musculoskeletal: strength 5/5 all extremities, no edema Skin: no rash, no subcutaneous crepitation, no decubitus Psych: appropriate patient   Labs on Admission:  Recent Labs    02/13/23 1520 02/13/23 2020  NA 139  --   K 3.8  --   CL 107  --   CO2 23  --   GLUCOSE 82  --   BUN 18  --   CREATININE 1.08  --   CALCIUM 8.8*  --   MG  --  2.1   Recent Labs    02/13/23 2020  AST 23  ALT 34  ALKPHOS 54  BILITOT 1.6*  PROT 6.3*  ALBUMIN 3.5    Recent Labs    02/13/23 1520  WBC 7.3  HGB 15.1  HCT 45.8  MCV 98.7  PLT 152   Recent Labs    02/13/23 2020  CKTOTAL 79    Radiological Exams on Admission: CT ANGIO HEAD NECK W WO CM  Result Date: 02/14/2023 CLINICAL DATA:  Transient ischemic attack EXAM: CT ANGIOGRAPHY HEAD AND NECK WITH AND WITHOUT CONTRAST TECHNIQUE: Multidetector CT imaging of the head and neck was performed using the standard protocol during bolus administration of intravenous contrast. Multiplanar CT image reconstructions and MIPs were obtained to evaluate the vascular anatomy. Carotid stenosis measurements (when applicable) are obtained utilizing NASCET criteria, using the distal internal carotid diameter as the denominator. RADIATION DOSE REDUCTION: This exam was performed according to the departmental dose-optimization program which includes automated exposure control, adjustment of the mA and/or kV according to patient size and/or use of iterative reconstruction technique. CONTRAST:  75mL OMNIPAQUE IOHEXOL 350 MG/ML SOLN COMPARISON:  CT cervical spine 09/15/2020 FINDINGS: CT HEAD FINDINGS Brain: There is no mass, hemorrhage or extra-axial collection. The size and configuration of the ventricles and extra-axial CSF spaces are normal. 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 There is hypoattenuation of the periventricular white matter, most commonly indicating chronic  ischemic microangiopathy. Skull: The visualized skull base, calvarium and extracranial soft tissues are normal. Sinuses/Orbits: Moderate left maxillary sinus mucosal thickening. The orbits are normal. CTA NECK FINDINGS SKELETON: There is no bony spinal canal stenosis. No lytic or blastic lesion. 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 UPPER CHEST: No pneumothorax or pleural effusion. No nodules or masses. AORTIC ARCH: There is no calcific atherosclerosis of the aortic arch. There is no aneurysm, dissection or hemodynamically significant stenosis of the visualized portion of the aorta. Conventional 3 vessel aortic branching pattern. The visualized proximal subclavian arteries are widely patent. RIGHT CAROTID SYSTEM: Normal without aneurysm, dissection or stenosis. LEFT CAROTID SYSTEM: Normal without aneurysm, dissection or stenosis. VERTEBRAL ARTERIES: Left dominant configuration. Both origins are clearly patent. There is no dissection, occlusion or flow-limiting stenosis to the skull base (V1-V3 segments). CTA HEAD FINDINGS POSTERIOR CIRCULATION: --Vertebral arteries: Normal V4 segments. --Inferior cerebellar arteries: Normal. --Basilar artery: Normal. --Superior cerebellar arteries: Normal. --Posterior cerebral arteries (PCA): Normal. ANTERIOR CIRCULATION: --Intracranial internal carotid arteries: Normal. --Anterior cerebral arteries (ACA): Normal. Absent left A1 segment, normal variant --Middle cerebral arteries (MCA): Normal. VENOUS SINUSES: As permitted by contrast timing, patent. ANATOMIC VARIANTS: Absent left ACA A1 segment, a common variant. Review of the MIP images confirms the above findings. IMPRESSION: 1. No emergent large vessel occlusion or hemodynamically significant stenosis of the head or neck. 2. 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 3. 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 Based on size, histologic sampling should be considered.  Electronically Signed   By: Deatra Robinson M.D.   On: 02/14/2023 00:34   MR BRAIN WO CONTRAST  Result Date: 02/13/2023 CLINICAL DATA:  Aphasia EXAM: MRI HEAD WITHOUT CONTRAST TECHNIQUE: Multiplanar, multiecho pulse sequences of the brain and surrounding structures were obtained without intravenous contrast. COMPARISON:  None Available. FINDINGS: Brain: No acute infarct, mass effect or extra-axial collection. Fewer than 5 scattered microhemorrhages in a nonspecific pattern. There is multifocal hyperintense T2-weighted signal within the white matter. Generalized volume loss. 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 Normal midline structures. Vascular: Major flow voids are preserved. Skull and upper cervical spine: Normal calvarium and skull base. Visualized upper cervical spine and soft tissues are normal. Sinuses/Orbits:Chronic left maxillary sinusitis.  Normal orbits. IMPRESSION: 1. No acute intracranial abnormality. 2. 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 Electronically Signed   By: Deatra Robinson M.D.   On: 02/13/2023 21:52   DG Chest 2 View  Result Date: 02/13/2023 CLINICAL DATA:  Fatigue, lightheaded, rule out infection. Weakness and jittery EXAM: CHEST - 2 VIEW COMPARISON:  CT chest 06/09/2021 and radiograph 09/15/2020 FINDINGS: Stable cardiomediastinal silhouette. Aortic atherosclerotic calcification. Left basilar atelectasis. No focal consolidation, pleural effusion, or pneumothorax. Severe dextroscoliosis, unchanged. IMPRESSION: No active cardiopulmonary disease. Electronically Signed   By: Minerva Fester M.D.   On: 02/13/2023 20:39    Assessment/Plan Present on Admission:  Slurred speech/TIA -TIA order set initiated -Neurochecks every 2 hours, then every 12. -2D echo in a.m. -DAPT initiated. Aspirin 81 mg and Plavix 75 mg daily initiated -PT/OT/speech consult -CT head, CT angio head and neck negative -Lipid panel ordered -Neurology on  board -Patient with PVC, bigeminy and bradycardia.    PVCs/bigeminy/?bradycardia -Patient with known history of PVCs, maintained on Toprol 50 mg daily and mexiletine 150 mg p.o. twice daily, next elected not resumed.  Toprol on hold due to permissive hypertension and occasional bradycardia -Currently contacted by EDP.  EP to see patient in AM.   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  Outpatient follow-up, biopsy   CAD in native artery -Continue Lipitor, aspirin, Plavix   HTN (hypertension) -Permissive hypertension.   HLD (hyperlipidemia) -Atorvastatin resumed   Hypothyroidism -Synthroid resumed -TSH  normal.   History of prostate cancer  Carola Viramontes 02/14/2023, 1:27 AM

## 2023-02-14 NOTE — Progress Notes (Signed)
Pt being d/c, VSS, IV removed, Education complete.   Balinda Quails, RN 02/14/2023 4:18 PM

## 2023-02-14 NOTE — Progress Notes (Signed)
OT Cancellation Note and Discharge  Patient Details Name: Duane Reyes MRN: [id number] DOB: September 07, 1950   Cancelled Treatment:    Reason Eval/Treat Not Completed: OT screened, no needs identified, will sign off.Received secure chat from evaluating PT, [name]: "support from daughter and neighbor/gf he is independent with mobility with no AD. able to complete ADL's, good UE strength/ROM for combined shoulder movements. may benefit from OPPT for balance but that is all."  [name] OT Acute Rehabilitation Services Office [phone]    [name] 02/14/2023, 11:34 AM

## 2023-02-14 NOTE — Plan of Care (Signed)
  Problem: Education: Goal: Knowledge of disease or condition will improve Outcome: Progressing   Problem: Ischemic Stroke/TIA Tissue Perfusion: Goal: Complications of ischemic stroke/TIA will be minimized Outcome: Progressing   Problem: Coping: Goal: Will identify appropriate support needs Outcome: Progressing   Problem: Health Behavior/Discharge Planning: Goal: Ability to manage health-related needs will improve Outcome: Progressing   Problem: Self-Care: Goal: Ability to participate in self-care as condition permits will improve Outcome: Progressing Goal: Ability to communicate needs accurately will improve Outcome: Progressing   Problem: Nutrition: Goal: Risk of aspiration will decrease Outcome: Progressing   Problem: Education: Goal: Knowledge of General Education information will improve Description: Including pain rating scale, medication(s)/side effects and non-pharmacologic comfort measures Outcome: Progressing   Problem: Coping: Goal: Level of anxiety will decrease Outcome: Progressing   Problem: Elimination: Goal: Will not experience complications related to urinary retention Outcome: Progressing   Problem: Safety: Goal: Ability to remain free from injury will improve Outcome: Progressing   Problem: Clinical Measurements: Goal: Cardiovascular complication will be avoided Outcome: Not Progressing   Problem: Activity: Goal: Risk for activity intolerance will decrease Outcome: Not Progressing

## 2023-02-14 NOTE — Evaluation (Signed)
Physical Therapy Evaluation Patient Details Name: Duane Reyes MRN: [id number] DOB: May 19, 1951 Today's Date: 02/14/2023  History of Present Illness  Patient is 72 y.o. male presented to ED feeling jittery, heart rate in the 60's (per pt), trouble getting his words out & feeling tired the past few months. A/Ox4, denies pain while in triage. PMH significant of HTN, HLD, CAD, PVCs, hypothyroidism, chronic back pain, history of prostate cancer. n the ER, CTA head and neck, MRI brain were both normal,  EKG shows normal sinus rhythm. EDP noted possible concern for bigeminy/bradycardia.   Clinical Impression  Duane Reyes is 72 y.o. male admitted with above HPI and diagnosis. Patient is currently limited by functional impairments below (see PT problem list). Patient lives alone and is independent with no AD for motility at baseline. Currently pt is mobilizing at mod I level for transfers and supervision level for gait with no AD. He is overall steady with mobility but has slight balance impairments evident by 19/24 score on DGI and would benefit from balance interventions. Patient will benefit from continued skilled PT interventions to address impairments and progress independence with mobility. Acute PT will follow and progress as able.        Recommendations for follow up therapy are one component of a multi-disciplinary discharge planning process, led by the attending physician.  Recommendations may be updated based on patient status, additional functional criteria and insurance authorization.  Follow Up Recommendations       Assistance Recommended at Discharge Intermittent Supervision/Assistance  Patient can return home with the following       Equipment Recommendations None recommended by PT  Recommendations for Other Services       Functional Status Assessment Patient has had a recent decline in their functional status and demonstrates the ability to make significant improvements in  function in a reasonable and predictable amount of time.     Precautions / Restrictions Precautions Precautions: Fall Restrictions Weight Bearing Restrictions: No      Mobility  Bed Mobility Overal bed mobility: Modified Independent             General bed mobility comments: mod I with supine<>sit and pivot from Rt<>Lt side of bed    Transfers Overall transfer level: Modified independent                 General transfer comment: use of hands for rise and lower    Ambulation/Gait Ambulation/Gait assistance: Supervision Gait Distance (Feet): 300 Feet Assistive device: None Gait Pattern/deviations: Step-through pattern, Decreased step length - right, Decreased step length - left, Decreased stride length Gait velocity: decr     General Gait Details: slightly shorted step length and low hip flex/knee flexion but functional foto clearance with swing phase. no buckling or LOB.  Stairs Stairs: Yes Stairs assistance: Supervision Stair Management: Forwards, Alternating pattern, One rail Right Number of Stairs: 5 General stair comments: alt pattern, no buckling or LOB, pt ascend/descend and able to turn to return over steps with single rail and no LOB.  Wheelchair Mobility    Modified Rankin (Stroke Patients Only)       Balance Overall balance assessment: Mild deficits observed, not formally tested                               Standardized Balance Assessment Standardized Balance Assessment : Dynamic Gait Index   Dynamic Gait Index Level Surface: Normal Change in Gait Speed:  Normal Gait with Horizontal Head Turns: Normal Gait with Vertical Head Turns: Mild Impairment Gait and Pivot Turn: Mild Impairment Step Over Obstacle: Mild Impairment Step Around Obstacles: Mild Impairment Steps: Mild Impairment Total Score: 19       Pertinent Vitals/Pain Pain Assessment Pain Assessment: No/denies pain    Home Living Family/patient expects to  be discharged to:: Private residence Living Arrangements: Alone Available Help at Discharge: Friend(s);Family;Available PRN/intermittently Type of Home: House Home Access: Stairs to enter Entrance Stairs-Rails: None Entrance Stairs-Number of Steps: 2   Home Layout: One level Home Equipment: Cane - single Librarian, academic (2 wheels);Shower seat - built in;Grab bars - tub/shower Additional Comments: retired: worked in Restaurant manager, 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    Prior Function Prior Level of Function : Independent/Modified Independent;Driving                     Higher education careers adviser   Dominant Hand: Right    Extremity/Trunk Assessment   Upper Extremity Assessment Upper Extremity Assessment: Overall WFL for tasks assessed    Lower Extremity Assessment Lower Extremity Assessment: Overall WFL for tasks assessed    Cervical / Trunk Assessment Cervical / Trunk Assessment: Kyphotic  Communication   Communication: No difficulties  Cognition Arousal/Alertness: Awake/alert Behavior During Therapy: WFL for tasks assessed/performed Overall Cognitive Status: Within Functional Limits for tasks assessed                                          General Comments      Exercises     Assessment/Plan    PT Assessment Patient needs continued PT services  PT Problem List Decreased strength;Decreased activity tolerance;Decreased balance;Decreased mobility;Decreased knowledge of use of DME;Decreased safety awareness;Obesity       PT Treatment Interventions DME instruction;Gait training;Stair training;Functional mobility training;Therapeutic activities;Therapeutic exercise;Balance training;Neuromuscular re-education;Cognitive remediation;Patient/family education    PT Goals (Current goals can be found in the Care Plan section)  Acute Rehab PT Goals Patient Stated Goal: get back home to regular activities/routine PT Goal Formulation: With patient Time For Goal  Achievement: 02/21/23 Potential to Achieve Goals: Good    Frequency Min 3X/week     Co-evaluation               AM-PAC PT "6 Clicks" Mobility  Outcome Measure Help needed turning from your back to your side while in a flat bed without using bedrails?: None Help needed moving from lying on your back to sitting on the side of a flat bed without using bedrails?: None Help needed moving to and from a bed to a chair (including a wheelchair)?: None Help needed standing up from a chair using your arms (e.g., wheelchair or bedside chair)?: None Help needed to walk in hospital room?: A Little Help needed climbing 3-5 steps with a railing? : A Little 6 Click Score: 22    End of Session Equipment Utilized During Treatment: Gait belt Activity Tolerance: Patient tolerated treatment well Patient left: with call bell/phone within reach Nurse Communication: Mobility status PT Visit Diagnosis: Muscle weakness (generalized) (M62.81);Difficulty in walking, not elsewhere classified (R26.2);Other abnormalities of gait and mobility (R26.89)    Time: 1610-9604 PT Time Calculation (min) (ACUTE ONLY): 18 min   Charges:   PT Evaluation $PT Eval Low Complexity: 1 Low          Wynn Maudlin, DPT Acute Rehabilitation Services Office [phone]  02/14/23 12:00 PM

## 2023-02-14 NOTE — Evaluation (Signed)
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/18/2017   Bronchitis 12/13/2017   Pre-operative clearance 12/13/2017   Bradycardia, drug induced 10/26/2016   CAD in native artery 01/30/2016   S/P angioplasty with stent 01/29/16 RPDA with 2 overlapping DES   01/30/2016   Tortuosity of aortic arch, -would not pursue Rt radial approach in this patient for future cardiac caths 01/30/2016   DOE (dyspnea on exertion)    Coronary artery calcification seen on CT scan    PVC (premature ventricular contraction) 01/12/2016   HTN (hypertension) 04/30/2015   HLD (hyperlipidemia) 04/30/2015   Spondylolisthesis of lumbar region L4-5 L5-S1 03/12/2013   Spinal stenosis, lumbar region, with neurogenic claudication 03/12/2013   Past Medical History:  Past Medical History:  Diagnosis Date   Arthritis    "left knee" (01/29/2016)   Bradycardia, drug induced 10/26/2016   Bronchiolitis    CAD in native artery    a. Abnl cardiac CT ; b. LHC 01/2016 s/p 50% mLAD, 25% OM2, 95% RPDA which was treated with overlapping DES; c. MV 03/2021 negative for ischemia   Chronic combined systolic and diastolic heart failure (HCC)    HFmrEF, echo 03/2021: EF 45-50%, GIDD.   Chronic knee pain    Chronic lower back pain    DOE (dyspnea on exertion)    Dyspnea    Frequent PVCs    a. 12/2015 - Holter showed SB, NSR, ST, HR 54-108, with frequent PVCs in singles, couplets, and bigemy, with elevated PVC load 24%.   GERD (gastroesophageal reflux disease)    History of shingles     Hyperlipidemia    Hypertension    Hypothyroidism    Prostate cancer (HCC)    Scoliosis    Spinal stenosis    Spondylolisthesis of lumbar region    Tortuosity of aortic arch    Tortuosity of aortic arch, -would not pursue Rt radial approach in this patient for future cardiac caths    Past Surgical History:  Past Surgical History:  Procedure Laterality Date   ANGIOPLASTY     BACK SURGERY     CARDIAC CATHETERIZATION N/A 01/29/2016   Procedure: Right/Left Heart Cath and Coronary Angiography;  Surgeon: Corky Crafts, MD;  Location: [facility] INVASIVE CV LAB;  Service: Cardiovascular;  Laterality: N/A;   CARDIAC CATHETERIZATION N/A 01/29/2016   Procedure: Coronary Stent Intervention 2.5/16mm Synergy-proximal PDA;  Surgeon: Corky Crafts, MD;  Location: [facility] INVASIVE CV LAB;  Service: Cardiovascular;  Laterality: N/A;   CATARACT EXTRACTION W/ INTRAOCULAR LENS IMPLANT Left 11/2015   COLONOSCOPY     CORONARY ANGIOPLASTY WITH STENT PLACEMENT  01/29/2016   "2 stents"   CYST EXCISION Right 2005   "near bicep"   ESOPHAGOGASTRODUODENOSCOPY     EYE SURGERY     INGUINAL HERNIA REPAIR     JOINT REPLACEMENT     KNEE ARTHROSCOPY Left 2012   "torn meniscus"   POSTERIOR LUMBAR FUSION  02/2013   "L4-5; hardware in place"   PROSTATE BIOPSY  2013   PROSTATE SURGERY  2013   "brachytherapy for prostate radiation"  TONSILLECTOMY  1950s   TOTAL KNEE ARTHROPLASTY Left 12/18/2017   TOTAL KNEE ARTHROPLASTY Left 12/18/2017   Procedure: TOTAL KNEE ARTHROPLASTY;  Surgeon: Dannielle Huh, MD;  Location: [facility] OR;  Service: Orthopedics;  Laterality: Left;   HPI:  Patient is 72 y.o. male presented to ED feeling jittery, heart rate in the 60's (per pt), trouble getting his words out & feeling tired the past few months. A/Ox4, denies pain while in triage. PMH significant of HTN, HLD, CAD, PVCs, hypothyroidism, chronic back pain, history of prostate cancer. n the ER, CTA head and neck, MRI brain were both normal,  EKG shows  normal sinus rhythm. EDP noted possible concern for bigeminy/bradycardia.   Assessment / Plan / Recommendation Clinical Impression  Pt participated in speech/language/cognitive assessment. Speech is fluent with no further deficits in word-retrieval. Receptive/expressive language WNL. Pt oriented x4; verbal problem solving, attention, and working and prospective memory are WNL. No SLP f/u is warranted.  D/W pt, dtr.    SLP Assessment  SLP Recommendation/Assessment: Patient does not need any further Speech Lanaguage Pathology Services    Recommendations for follow up therapy are one component of a multi-disciplinary discharge planning process, led by the attending physician.  Recommendations may be updated based on patient status, additional functional criteria and insurance authorization.    Follow Up Recommendations  No SLP follow up                       SLP Evaluation Cognition  Overall Cognitive Status: Within Functional Limits for tasks assessed Arousal/Alertness: Awake/alert Orientation Level: Oriented X4 Attention: Selective Selective Attention: Appears intact Memory: Appears intact Awareness: Appears intact Problem Solving: Appears intact Safety/Judgment: Appears intact       Comprehension  Auditory Comprehension Overall Auditory Comprehension: Appears within functional limits for tasks assessed Visual Recognition/Discrimination Discrimination: Within Function Limits Reading Comprehension Reading Status: Not tested    Expression Expression Primary Mode of Expression: Verbal Verbal Expression Overall Verbal Expression: Appears within functional limits for tasks assessed   Oral / Motor  Oral Motor/Sensory Function Overall Oral Motor/Sensory Function: Within functional limits Motor Speech Overall Motor Speech: Appears within functional limits for tasks assessed            Blenda Mounts Laurice 02/14/2023, 3:45 PM Lazarus Sudbury L. Samson Frederic, MA CCC/SLP Clinical  Specialist - Acute Care SLP Acute Rehabilitation Services Office number [phone]

## 2023-02-14 NOTE — Procedures (Signed)
Patient Name: Duane Reyes  MRN: [id number]  Epilepsy Attending: Charlsie Quest  Referring Physician/Provider: Lynnae January, NP  Date: 02/14/2023 Duration: 22.32 mins  Patient history: 72 y.o. male with PMH significant for CHF, GERD, HTN, HLD, hypothyroidism, PVCs, GERD, chronic DOE, who presents with episode of word finding difficulty. EEG to evaluate for seizure  Level of alertness: Awake, asleep  AEDs during EEG study: GBP  Technical aspects: This EEG study was done with scalp electrodes positioned according to the 10-20 International system of electrode placement. Electrical activity was reviewed with band pass filter of 1-70Hz , sensitivity of 7 uV/mm, display speed of 33mm/sec with a 60Hz  notched filter applied as appropriate. EEG data were recorded continuously and digitally stored.  Video monitoring was available and reviewed as appropriate.  Description: The posterior dominant rhythm consists of 7.5 Hz activity of moderate voltage (25-35 uV) seen predominantly in posterior head regions, symmetric and reactive to eye opening and eye closing. Sleep was characterized by vertex waves, sleep spindles (12 to 14 Hz), maximal frontocentral region. Hyperventilation and photic stimulation were not performed.     IMPRESSION: This study is within normal limits. No seizures or epileptiform discharges were seen throughout the recording.  A normal interictal EEG does not exclude the diagnosis of epilepsy.  Duane Reyes

## 2023-02-14 NOTE — ED Notes (Signed)
ED TO INPATIENT HANDOFF REPORT  ED Nurse Name and Phone #: Grant Fontana PM [phone]  S Name/Age/Gender Duane Reyes 72 y.o. male Room/Bed: 003C/003C  Code Status   Code Status: Prior  Home/SNF/Other Home Patient oriented to: self, place, time, and situation Is this baseline? Yes   Triage Complete: Triage complete  Chief Complaint TIA (transient ischemic attack) [G45.9]  Triage Note Pt came in via POV d/t feeling jittery, heart rate in the 60's (per pt), trouble getting his words out & feeling tired the past few months. A/Ox4, denies pain while in triage.   Allergies Allergies  Allergen Reactions   Topiramate Anxiety, Palpitations and Other (See Comments)    blurred vision, tired, ringing in ears, restlessness, confusion, rapid heart rate, made him jittery   Celecoxib Other (See Comments)    GI Upset; did not mix with his Nexium/Caused acid relux     Pitavastatin Rash and Cough    LIVALO    Rosuvastatin Calcium Other (See Comments)    Muscle aches    Hydrocodone-Acetaminophen Other (See Comments)    Delusions     Level of Care/Admitting Diagnosis ED Disposition     ED Disposition  Admit   Condition  --   Comment  Hospital Area: [facility] [[id number]]  Level of Care: Telemetry Medical [[id number]]  May place patient in observation at [facility] or [facility] if equivalent level of care is available:: No  Covid Evaluation: Confirmed COVID Negative  Diagnosis: TIA (transient ischemic attack) [454098]  Admitting Physician: Gery Pray [[id number]  Attending Physician: Alvester Chou          B Medical/Surgery History Past Medical History:  Diagnosis Date   Arthritis    "left knee" (01/29/2016)   Bradycardia, drug induced 10/26/2016   Bronchiolitis    CAD in native artery    a. Abnl cardiac CT ; b. LHC 01/2016 s/p 50% mLAD, 25% OM2, 95% RPDA which was treated with overlapping DES; c. MV 03/2021 negative for ischemia   Chronic combined  systolic and diastolic heart failure (HCC)    HFmrEF, echo 03/2021: EF 45-50%, GIDD.   Chronic knee pain    Chronic lower back pain    DOE (dyspnea on exertion)    Dyspnea    Frequent PVCs    a. 12/2015 - Holter showed SB, NSR, ST, HR 54-108, with frequent PVCs in singles, couplets, and bigemy, with elevated PVC load 24%.   GERD (gastroesophageal reflux disease)    History of shingles    Hyperlipidemia    Hypertension    Hypothyroidism    Prostate cancer (HCC)    Scoliosis    Spinal stenosis    Spondylolisthesis of lumbar region    Tortuosity of aortic arch    Tortuosity of aortic arch, -would not pursue Rt radial approach in this patient for future cardiac caths    Past Surgical History:  Procedure Laterality Date   ANGIOPLASTY     BACK SURGERY     CARDIAC CATHETERIZATION N/A 01/29/2016   Procedure: Right/Left Heart Cath and Coronary Angiography;  Surgeon: Corky Crafts, MD;  Location: [facility] INVASIVE CV LAB;  Service: Cardiovascular;  Laterality: N/A;   CARDIAC CATHETERIZATION N/A 01/29/2016   Procedure: Coronary Stent Intervention 2.5/16mm Synergy-proximal PDA;  Surgeon: Corky Crafts, MD;  Location: [facility] INVASIVE CV LAB;  Service: Cardiovascular;  Laterality: N/A;   CATARACT EXTRACTION W/ INTRAOCULAR LENS IMPLANT Left 11/2015   COLONOSCOPY     CORONARY ANGIOPLASTY  WITH STENT PLACEMENT  01/29/2016   "2 stents"   CYST EXCISION Right 2005   "near bicep"   ESOPHAGOGASTRODUODENOSCOPY     EYE SURGERY     INGUINAL HERNIA REPAIR     JOINT REPLACEMENT     KNEE ARTHROSCOPY Left 2012   "torn meniscus"   POSTERIOR LUMBAR FUSION  02/2013   "L4-5; hardware in place"   PROSTATE BIOPSY  2013   PROSTATE SURGERY  2013   "brachytherapy for prostate radiation"   TONSILLECTOMY  1950s   TOTAL KNEE ARTHROPLASTY Left 12/18/2017   TOTAL KNEE ARTHROPLASTY Left 12/18/2017   Procedure: TOTAL KNEE ARTHROPLASTY;  Surgeon: Dannielle Huh, MD;  Location: [facility] OR;  Service: Orthopedics;  Laterality: Left;      A IV Location/Drains/Wounds Patient Lines/Drains/Airways Status     Active Line/Drains/Airways     Name Placement date Placement time Site Days   Peripheral IV 02/13/23 20 G 1" Anterior;Distal;Right Forearm 02/13/23  2019  Forearm  1   Incision (Closed) 12/18/17 Knee Left 12/18/17  0815  -- 1884            Intake/Output Last 24 hours No intake or output data in the 24 hours ending 02/14/23 0201  Labs/Imaging Results for orders placed or performed during the hospital encounter of 02/13/23 (from the past 48 hour(s))  Urinalysis, Routine w reflex microscopic -Urine, Clean Catch     Status: None   Collection Time: 02/13/23  3:10 PM  Result Value Ref Range   Color, Urine YELLOW YELLOW   APPearance CLEAR CLEAR   Specific Gravity, Urine 1.025 1.005 - 1.030   pH 5.0 5.0 - 8.0   Glucose, UA NEGATIVE NEGATIVE mg/dL   Hgb urine dipstick NEGATIVE NEGATIVE   Bilirubin Urine NEGATIVE NEGATIVE   Ketones, ur NEGATIVE NEGATIVE mg/dL   Protein, ur NEGATIVE NEGATIVE mg/dL   Nitrite NEGATIVE NEGATIVE   Leukocytes,Ua NEGATIVE NEGATIVE    Comment: Performed at [facility], [address], [place], [place] [address]  Basic metabolic panel     Status: Abnormal   Collection Time: 02/13/23  3:20 PM  Result Value Ref Range   Sodium 139 135 - 145 mmol/L   Potassium 3.8 3.5 - 5.1 mmol/L   Chloride 107 98 - 111 mmol/L   CO2 23 22 - 32 mmol/L   Glucose, Bld 82 70 - 99 mg/dL    Comment: Glucose reference range applies only to samples taken after fasting for at least 8 hours.   BUN 18 8 - 23 mg/dL   Creatinine, Ser 6.04 0.61 - 1.24 mg/dL   Calcium 8.8 (L) 8.9 - 10.3 mg/dL   GFR, Estimated >54 >09 mL/min    Comment: (NOTE) Calculated using the CKD-EPI Creatinine Equation (2021)    Anion gap 9 5 - 15    Comment: Performed at [facility], [address] [place], [place] [address]  CBC     Status: None   Collection Time: 02/13/23  3:20 PM  Result Value Ref Range   WBC 7.3  4.0 - 10.5 K/uL   RBC 4.64 4.22 - 5.81 MIL/uL   Hemoglobin 15.1 13.0 - 17.0 g/dL   HCT 47.8 29.5 - 62.1 %   MCV 98.7 80.0 - 100.0 fL   MCH 32.5 26.0 - 34.0 pg   MCHC 33.0 30.0 - 36.0 g/dL   RDW 30.8 65.7 - 84.6 %   Platelets 152 150 - 400 K/uL   nRBC 0.0 0.0 - 0.2 %  Comment: Performed at [facility], [address] [place], [place] [address]  CBG monitoring, ED     Status: Abnormal   Collection Time: 02/13/23  3:38 PM  Result Value Ref Range   Glucose-Capillary 101 (H) 70 - 99 mg/dL    Comment: Glucose reference range applies only to samples taken after fasting for at least 8 hours.  Hepatic function panel     Status: Abnormal   Collection Time: 02/13/23  8:20 PM  Result Value Ref Range   Total Protein 6.3 (L) 6.5 - 8.1 g/dL   Albumin 3.5 3.5 - 5.0 g/dL   AST 23 15 - 41 U/L   ALT 34 0 - 44 U/L   Alkaline Phosphatase 54 38 - 126 U/L   Total Bilirubin 1.6 (H) 0.3 - 1.2 mg/dL   Bilirubin, Direct 0.3 (H) 0.0 - 0.2 mg/dL   Indirect Bilirubin 1.3 (H) 0.3 - 0.9 mg/dL    Comment: Performed at [facility], [address], [place], [place] [address]  Ammonia     Status: None   Collection Time: 02/13/23  8:20 PM  Result Value Ref Range   Ammonia 20 9 - 35 umol/L    Comment: Performed at [facility], [address], [place], [place] [address]  CK     Status: None   Collection Time: 02/13/23  8:20 PM  Result Value Ref Range   Total CK 79 49 - 397 U/L    Comment: Performed at [facility], [address] [place], [place] [address]  TSH     Status: None   Collection Time: 02/13/23  8:20 PM  Result Value Ref Range   TSH 2.589 0.350 - 4.500 uIU/mL    Comment: Performed by a 3rd Generation assay with a functional sensitivity of <=0.01 uIU/mL. Performed at [facility], [address] [place], [place] [address]   Magnesium     Status: None   Collection Time: 02/13/23  8:20 PM  Result Value Ref Range   Magnesium 2.1 1.7 - 2.4 mg/dL    Comment:  Performed at [facility], [address], [place], [place] [address]   CT ANGIO HEAD NECK W WO CM  Result Date: 02/14/2023 CLINICAL DATA:  Transient ischemic attack EXAM: CT ANGIOGRAPHY HEAD AND NECK WITH AND WITHOUT CONTRAST TECHNIQUE: Multidetector CT imaging of the head and neck was performed using the standard protocol during bolus administration of intravenous contrast. Multiplanar CT image reconstructions and MIPs were obtained to evaluate the vascular anatomy. Carotid stenosis measurements (when applicable) are obtained utilizing NASCET criteria, using the distal internal carotid diameter as the denominator. RADIATION DOSE REDUCTION: This exam was performed according to the departmental dose-optimization program which includes automated exposure control, adjustment of the mA and/or kV according to patient size and/or use of iterative reconstruction technique. CONTRAST:  75mL OMNIPAQUE IOHEXOL 350 MG/ML SOLN COMPARISON:  CT cervical spine 09/15/2020 FINDINGS: CT HEAD FINDINGS Brain: There is no mass, hemorrhage or extra-axial collection. The size and configuration of the ventricles and extra-axial CSF spaces are normal. 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 There is hypoattenuation of the periventricular white matter, most commonly indicating chronic ischemic microangiopathy. Skull: The visualized skull base, calvarium and extracranial soft tissues are normal. Sinuses/Orbits: Moderate left maxillary sinus mucosal thickening. The orbits are normal. CTA NECK FINDINGS SKELETON: There is no bony spinal canal stenosis. No lytic or blastic lesion. OTHER NECK: 2.1 cm  left parotid mass. UPPER CHEST: No pneumothorax or pleural effusion. No nodules or masses. AORTIC ARCH: There is no calcific atherosclerosis of the aortic arch. There is no aneurysm, dissection or hemodynamically significant stenosis of the visualized portion of the aorta. Conventional 3 vessel aortic branching  pattern. The visualized proximal subclavian arteries are widely patent. RIGHT CAROTID SYSTEM: Normal without aneurysm, dissection or stenosis. LEFT CAROTID SYSTEM: Normal without aneurysm, dissection or stenosis. VERTEBRAL ARTERIES: Left dominant configuration. Both origins are clearly patent. There is no dissection, occlusion or flow-limiting stenosis to the skull base (V1-V3 segments). CTA HEAD FINDINGS POSTERIOR CIRCULATION: --Vertebral arteries: Normal V4 segments. --Inferior cerebellar arteries: Normal. --Basilar artery: Normal. --Superior cerebellar arteries: Normal. --Posterior cerebral arteries (PCA): Normal. ANTERIOR CIRCULATION: --Intracranial internal carotid arteries: Normal. --Anterior cerebral arteries (ACA): Normal. Absent left A1 segment, normal variant --Middle cerebral arteries (MCA): Normal. VENOUS SINUSES: As permitted by contrast timing, patent. ANATOMIC VARIANTS: Absent left ACA A1 segment, a common variant. Review of the MIP images confirms the above findings. IMPRESSION: 1. No emergent large vessel occlusion or hemodynamically significant stenosis of the head or neck. 2. 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 3. 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 Based on size, histologic sampling should be considered. Electronically Signed   By: Deatra Robinson M.D.   On: 02/14/2023 00:34   MR BRAIN WO CONTRAST  Result Date: 02/13/2023 CLINICAL DATA:  Aphasia EXAM: MRI HEAD WITHOUT CONTRAST TECHNIQUE: Multiplanar, multiecho pulse sequences of the brain and surrounding structures were obtained without intravenous contrast. COMPARISON:  None Available. FINDINGS: Brain: No acute infarct, mass effect or extra-axial collection. Fewer than 5 scattered microhemorrhages in a nonspecific pattern. There is multifocal hyperintense T2-weighted signal within the white matter. Generalized volume loss. 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 Normal  midline structures. Vascular: Major flow voids are preserved. Skull and upper cervical spine: Normal calvarium and skull base. Visualized upper cervical spine and soft tissues are normal. Sinuses/Orbits:Chronic left maxillary sinusitis.  Normal orbits. IMPRESSION: 1. No acute intracranial abnormality. 2. 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 Electronically Signed   By: Deatra Robinson M.D.   On: 02/13/2023 21:52   DG Chest 2 View  Result Date: 02/13/2023 CLINICAL DATA:  Fatigue, lightheaded, rule out infection. Weakness and jittery EXAM: CHEST - 2 VIEW COMPARISON:  CT chest 06/09/2021 and radiograph 09/15/2020 FINDINGS: Stable cardiomediastinal silhouette. Aortic atherosclerotic calcification. Left basilar atelectasis. No focal consolidation, pleural effusion, or pneumothorax. Severe dextroscoliosis, unchanged. IMPRESSION: No active cardiopulmonary disease. Electronically Signed   By: Minerva Fester M.D.   On: 02/13/2023 20:39    Pending Labs Unresulted Labs (From admission, onward)    None       Vitals/Pain Today's Vitals   02/13/23 2248 02/13/23 2300 02/13/23 2330 02/14/23 0030  BP:  (!) 144/65 118/65 136/85  Pulse:  (!) 37 (!) 30 67  Resp:   17 18  Temp: (!) 97.5 F (36.4 C)     TempSrc: Oral     SpO2:  97% 98% 97%  Weight:      Height:      PainSc:        Isolation Precautions No active isolations  Medications Medications  aspirin EC tablet 81 mg (has no administration in time range)  atorvastatin (LIPITOR) tablet 80 mg (has no administration in time range)  clopidogrel (PLAVIX) tablet 75 mg (has no administration in time range)  levothyroxine (SYNTHROID) tablet 125  mcg (has no administration in time range)  mexiletine (MEXITIL) capsule 300 mg (has no administration in time range)  pantoprazole (PROTONIX) EC tablet 40 mg (has no administration in time range)  prednisoLONE acetate (PRED FORTE) 1 % ophthalmic suspension 1 drop  (has no administration in time range)  fluticasone furoate-vilanterol (BREO ELLIPTA) 100-25 MCG/ACT 1 puff (has no administration in time range)  gabapentin (NEURONTIN) capsule 300 mg (has no administration in time range)  iohexol (OMNIPAQUE) 350 MG/ML injection 75 mL (75 mLs Intravenous Contrast Given 02/13/23 2358)    Mobility walks     Focused Assessments Cardiac Assessment Handoff:    Lab Results  Component Value Date   CKTOTAL 79 02/13/2023   TROPONINI <0.03 07/17/2018   No results found for: "DDIMER" Does the Patient currently have chest pain? No   , Neuro Assessment Handoff:  Swallow screen pass? Yes          Neuro Assessment:   Neuro Checks:      Has TPA been given? No If patient is a Neuro Trauma and patient is going to OR before floor call report to 4N Charge nurse: [phone] or [phone]   R Recommendations: See Admitting Provider Note  Report given to:   Additional Notes:

## 2023-02-17 NOTE — Discharge Summary (Signed)
Physician Discharge Summary   Patient: Duane Reyes MRN: [id number] DOB: 24-Apr-1951  Admit date:     02/13/2023  Discharge date: 02/17/23  Discharge Physician: Alberteen Sam   PCP: Eartha Inch, MD     Recommendations at discharge:  Follow up with PCP Dr. Cyndia Bent for TIA  Dr. Cyndia Bent:  Please refer to ENT for further work up of incidental left parotid mass If weakness and palpitations continue, recommend Cardiology follow up     Discharge Diagnoses: Principal Problem:   TIA (transient ischemic attack) Active Problems:   HTN (hypertension)   HLD (hyperlipidemia)   CAD in native artery   Bigeminy   Hypothyroidism   History of prostate cancer   Parotid mass      Hospital Course: Mr. Cyndia Bent is a 72 year old M with sCHF EF 40-45%, HTN, hypothyroidism who presented with acute slurred speech.  Had been working in the garage, preparing to mow the lawn in the heat, started to feel bad.  Called a family member who thought he sounded slurred.  When they came to him he appeared pale so they called 9-1-1.  In the ER, there was some "bradycardia" noted, although this was simply bigeminy which was not detected by the monitor, and his HR remained normal.      TIA MRI brain showed no acute infarct. - Non-invasive angiography showed no LVO, no significant atherosclerosis. - Echocardiogram showed no cardiogenic source of embolism - Carotid imaging unremarkable   - Lipids ordered: LDL 60 - Aspirin and Plavix at discharge for 3 weeks then aspirin alone - Atrial fibrillation: none on monitoring - tPA not given because symptoms resolved - Dysphagia screen ordered in ER - PT eval ordered: outpatient PT - Nonsmoker     Left parotid mass 2cm, seen incidentally on CTA head and neck. - Recommend tissue sampling          The [facility] Controlled Substances Registry was reviewed for this patient prior to discharge.   Consultants: Neurology Procedures  performed:  CT head CTA head and neck MRI brain Echo   Disposition: Home Diet recommendation:  Discharge Diet Orders (From admission, onward)     Start     Ordered   02/14/23 0000  Diet - low sodium heart healthy        02/14/23 1604             DISCHARGE MEDICATION: Allergies as of 02/14/2023       Reactions   Topiramate Anxiety, Palpitations, Other (See Comments)   blurred vision, tired, ringing in ears, restlessness, confusion, rapid heart rate, made him jittery   Celecoxib Other (See Comments)   GI Upset; did not mix with his Nexium/Caused acid relux    Pitavastatin Rash, Cough   LIVALO   Rosuvastatin Calcium Other (See Comments)   Muscle aches   Hydrocodone-acetaminophen Other (See Comments)   Delusions         Medication List     TAKE these medications    acetaminophen 500 MG tablet Commonly known as: TYLENOL Take 500 mg by mouth every 6 (six) hours as needed.   albuterol 108 (90 Base) MCG/ACT inhaler Commonly known as: ProAir HFA Inhale 2 puffs into the lungs every 6 (six) hours as needed (for wheezing or shortness of breath).   aspirin EC 81 MG tablet Take 1 tablet (81 mg total) by mouth daily. Swallow whole.   atorvastatin 80 MG tablet Commonly known as: LIPITOR Take 1 tablet (80 mg total)  by mouth daily.   baclofen 10 MG tablet Commonly known as: LIORESAL Take 10 mg by mouth as needed.   budesonide-formoterol 80-4.5 MCG/ACT inhaler Commonly known as: SYMBICORT Inhale 2 puffs into the lungs 2 (two) times daily as needed (for flares).   clopidogrel 75 MG tablet Commonly known as: PLAVIX TAKE 1 TABLET DAILY WITH BREAKFAST   Co Q-10 120 MG Caps Take 120 mg by mouth 3 (three) times daily.   diclofenac Sodium 1 % Gel Commonly known as: VOLTAREN Apply 1 application. topically. 3-4 times daily   gabapentin 300 MG capsule Commonly known as: NEURONTIN Take 300 mg by mouth 5 (five) times daily.   hydrocortisone 2.5 % cream Apply 1  Application topically as needed (for ankle).   levothyroxine 125 MCG tablet Commonly known as: SYNTHROID Take 125 mcg by mouth daily before breakfast.   metoprolol succinate 50 MG 24 hr tablet Commonly known as: TOPROL-XL TAKE 1 TABLET EVERY DAY WITH OR IMMEDIATELY FOLLOWING A MEAL. DISCONTINUE VERAPAMIL What changed: See the new instructions.   mexiletine 150 MG capsule Commonly known as: MEXITIL Take 2 capsules (300 mg total) by mouth 2 (two) times daily.   montelukast 10 MG tablet Commonly known as: SINGULAIR Take 10 mg by mouth daily.   nitroGLYCERIN 0.4 MG SL tablet Commonly known as: NITROSTAT Place 1 tablet (0.4 mg total) under the tongue every 5 (five) minutes as needed for chest pain (x 3 doses).   pantoprazole 40 MG tablet Commonly known as: PROTONIX Take 1 tablet (40 mg total) by mouth daily.   prednisoLONE acetate 1 % ophthalmic suspension Commonly known as: PRED FORTE Place 1 drop into the left eye 4 (four) times daily.   PRESERVISION AREDS 2 PO Take 1 capsule by mouth 2 (two) times daily.   sildenafil 20 MG tablet Commonly known as: REVATIO Take 1-5 tablets by mouth daily as needed.   traMADol 50 MG tablet Commonly known as: ULTRAM Take 50 mg by mouth every 6 (six) hours as needed for moderate pain.   VITAMIN D-3 PO Take 1 capsule by mouth 2 (two) times a week.        Follow-up Information     Eartha Inch, MD. Schedule an appointment as soon as possible for a visit in 1 week(s).   Specialty: Family Medicine Contact information: [address] [place] [address] [phone]         [facility]. Schedule an appointment as soon as possible for a visit in 2 month(s).   Specialty: Neurology Contact information: [address] [place] [address] [phone]                Discharge Instructions     Ambulatory referral to Neurology   Complete by: As  directed    An appointment is requested in approximately: 8 weeks TIA hospital follow up   Diet - low sodium heart healthy   Complete by: As directed    Discharge instructions   Complete by: As directed    **IMPORTANT DISCHARGE INSTRUCTIONS**   From Dr. Maryfrances Bunnell: You were admitted for a TIA Here, you had an MRI that showed no permanent damage from a stroke  You had imaging of your neck arteries that showed no blockages, and an echocardiogram of your heart that was stable from 2 year ago  To prevent another TIA, continue your cholesterol medicine atorvastatin and your medicine clopidogrel/Plavix ADD aspirin 81 mg daily for 3  weeks then stop and continue Plavix alone You should follow up with Dr. Cyndia Bent in 1 week  Let him know if you are still feeling weak and under the weather Let him know if you are still having palpitations  You were noticed incidentally to have a small 2 cm left Parotid mass. Ask Dr. Cyndia Bent for a referral to an ENT for this   Increase activity slowly   Complete by: As directed        Discharge Exam: Filed Weights   02/13/23 1519  Weight: 75.8 kg    General: Pt is alert, awake, not in acute distress Cardiovascular: RRR, nl S1-S2, no murmurs appreciated.   No LE edema.   Respiratory: Normal respiratory rate and rhythm.  CTAB without rales or wheezes. Abdominal: Abdomen soft and non-tender.  No distension or HSM.   Neuro/Psych: Strength symmetric in upper and lower extremities.  Judgment and insight appear normal.   Condition at discharge: good  The results of significant diagnostics from this hospitalization (including imaging, microbiology, ancillary and laboratory) are listed below for reference.   Imaging Studies: EEG adult  Result Date: 2023-02-24 Charlsie Quest, MD     February 24, 2023  8:32 PM Patient Name: HRITHIK BOSCHEE MRN: [id number] Epilepsy Attending: Charlsie Quest Referring Physician/Provider: Lynnae January, NP Date: 2023-02-24  Duration: 22.32 mins Patient history: 72 y.o. male with PMH significant for CHF, GERD, HTN, HLD, hypothyroidism, PVCs, GERD, chronic DOE, who presents with episode of word finding difficulty. EEG to evaluate for seizure Level of alertness: Awake, asleep AEDs during EEG study: GBP Technical aspects: This EEG study was done with scalp electrodes positioned according to the 10-20 International system of electrode placement. Electrical activity was reviewed with band pass filter of 1-70Hz , sensitivity of 7 uV/mm, display speed of 81mm/sec with a 60Hz  notched filter applied as appropriate. EEG data were recorded continuously and digitally stored.  Video monitoring was available and reviewed as appropriate. Description: The posterior dominant rhythm consists of 7.5 Hz activity of moderate voltage (25-35 uV) seen predominantly in posterior head regions, symmetric and reactive to eye opening and eye closing. Sleep was characterized by vertex waves, sleep spindles (12 to 14 Hz), maximal frontocentral region. Hyperventilation and photic stimulation were not performed.   IMPRESSION: This study is within normal limits. No seizures or epileptiform discharges were seen throughout the recording. A normal interictal EEG does not exclude the diagnosis of epilepsy. Charlsie Quest   ECHOCARDIOGRAM COMPLETE  Result Date: 02-24-2023    ECHOCARDIOGRAM REPORT   Patient Name:   MADYX DELFIN Date of Exam: Feb 24, 2023 Medical Rec #:  [id number]        Height:       67.5 in Accession #:    [id number]       Weight:       167.0 lb Date of Birth:  1951/01/19         BSA:          1.883 m Patient Age:    72 years         BP:           119/60 mmHg Patient Gender: M                HR:           73 bpm. Exam Location:  Inpatient Procedure: 2D Echo, Cardiac Doppler and Color Doppler Indications:    TIA G45.9  History:        Patient  has prior history of Echocardiogram examinations, most                 recent 04/08/2021. CAD, TIA,  Arrythmias:Bradycardia and PVC,                 Signs/Symptoms:Dyspnea; Risk Factors:Hypertension and                 Dyslipidemia.  Sonographer:    Lucendia Herrlich Referring Phys: [phone] DEBBY CROSLEY IMPRESSIONS  1. Left ventricular ejection fraction, by estimation, is 45 to 50%. The left ventricle has mildly decreased function. The left ventricle demonstrates regional wall motion abnormalities (see scoring diagram/findings for description). Left ventricular diastolic parameters are consistent with Grade I diastolic dysfunction (impaired relaxation). There is mild hypokinesis of the left ventricular, basal inferior wall.  2. Right ventricular systolic function is normal. The right ventricular size is normal.  3. A small pericardial effusion is present. The pericardial effusion is circumferential.  4. The mitral valve is normal in structure. No evidence of mitral valve regurgitation. No evidence of mitral stenosis.  5. The aortic valve is tricuspid. There is mild calcification of the aortic valve. There is mild thickening of the aortic valve. Aortic valve regurgitation is not visualized. Aortic valve sclerosis is present, with no evidence of aortic valve stenosis.  6. The inferior vena cava is normal in size with greater than 50% respiratory variability, suggesting right atrial pressure of 3 mmHg. Comparison(s): No significant change from prior study. Prior images reviewed side by side. FINDINGS  Left Ventricle: Left ventricular ejection fraction, by estimation, is 45 to 50%. The left ventricle has mildly decreased function. The left ventricle demonstrates regional wall motion abnormalities. Mild hypokinesis of the left ventricular, basal inferior wall. The left ventricular internal cavity size was normal in size. There is no left ventricular hypertrophy. Left ventricular diastolic parameters are consistent with Grade I diastolic dysfunction (impaired relaxation). Normal left ventricular filling pressure. Right  Ventricle: The right ventricular size is normal. No increase in right ventricular wall thickness. Right ventricular systolic function is normal. Left Atrium: Left atrial size was normal in size. Right Atrium: Right atrial size was normal in size. Pericardium: A small pericardial effusion is present. The pericardial effusion is circumferential. Mitral Valve: The mitral valve is normal in structure. No evidence of mitral valve regurgitation. No evidence of mitral valve stenosis. Tricuspid Valve: The tricuspid valve is normal in structure. Tricuspid valve regurgitation is not demonstrated. No evidence of tricuspid stenosis. Aortic Valve: The aortic valve is tricuspid. There is mild calcification of the aortic valve. There is mild thickening of the aortic valve. Aortic valve regurgitation is not visualized. Aortic valve sclerosis is present, with no evidence of aortic valve stenosis. Aortic valve peak gradient measures 4.7 mmHg. Pulmonic Valve: The pulmonic valve was not well visualized. Pulmonic valve regurgitation is not visualized. No evidence of pulmonic stenosis. Aorta: The aortic root is normal in size and structure. Venous: The inferior vena cava is normal in size with greater than 50% respiratory variability, suggesting right atrial pressure of 3 mmHg. IAS/Shunts: No atrial level shunt detected by color flow Doppler.  LEFT VENTRICLE PLAX 2D LVIDd:         4.30 cm   Diastology LVIDs:         2.60 cm   LV e' medial:    7.77 cm/s LV PW:         0.90 cm   LV E/e' medial:  6.4 LV IVS:  1.00 cm   LV e' lateral:   6.37 cm/s LVOT diam:     2.40 cm   LV E/e' lateral: 7.8 LV SV:         86 LV SV Index:   46 LVOT Area:     4.52 cm  RIGHT VENTRICLE             IVC RV S prime:     13.80 cm/s  IVC diam: 1.50 cm TAPSE (M-mode): 1.8 cm LEFT ATRIUM           Index        RIGHT ATRIUM           Index LA diam:      3.40 cm 1.81 cm/m   RA Area:     14.00 cm LA Vol (A2C): 23.1 ml 12.26 ml/m  RA Volume:   28.40 ml  15.08  ml/m LA Vol (A4C): 26.2 ml 13.91 ml/m  AORTIC VALVE AV Area (Vmax): 4.20 cm AV Vmax:        108.00 cm/s AV Peak Grad:   4.7 mmHg LVOT Vmax:      100.20 cm/s LVOT Vmean:     63.750 cm/s LVOT VTI:       0.190 m  AORTA Ao Root diam: 3.30 cm Ao Asc diam:  3.20 cm MITRAL VALVE MV Area (PHT): 3.03 cm    SHUNTS MV Decel Time: 250 msec    Systemic VTI:  0.19 m MV E velocity: 49.50 cm/s  Systemic Diam: 2.40 cm MV A velocity: 79.40 cm/s MV E/A ratio:  0.62 Mihai Croitoru MD Electronically signed by Thurmon Fair MD Signature Date/Time: 02/14/2023/2:06:27 PM    Final    CT ANGIO HEAD NECK W WO CM  Result Date: 02/14/2023 CLINICAL DATA:  Transient ischemic attack EXAM: CT ANGIOGRAPHY HEAD AND NECK WITH AND WITHOUT CONTRAST TECHNIQUE: Multidetector CT imaging of the head and neck was performed using the standard protocol during bolus administration of intravenous contrast. Multiplanar CT image reconstructions and MIPs were obtained to evaluate the vascular anatomy. Carotid stenosis measurements (when applicable) are obtained utilizing NASCET criteria, using the distal internal carotid diameter as the denominator. RADIATION DOSE REDUCTION: This exam was performed according to the departmental dose-optimization program which includes automated exposure control, adjustment of the mA and/or kV according to patient size and/or use of iterative reconstruction technique. CONTRAST:  75mL OMNIPAQUE IOHEXOL 350 MG/ML SOLN COMPARISON:  CT cervical spine 09/15/2020 FINDINGS: CT HEAD FINDINGS Brain: There is no mass, hemorrhage or extra-axial collection. The size and configuration of the ventricles and extra-axial CSF spaces are normal. 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 There is hypoattenuation of the periventricular white matter, most commonly indicating chronic ischemic microangiopathy. Skull: The visualized skull base, calvarium and extracranial soft tissues are normal. Sinuses/Orbits: Moderate  left maxillary sinus mucosal thickening. The orbits are normal. CTA NECK FINDINGS SKELETON: There is no bony spinal canal stenosis. No lytic or blastic lesion. 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 UPPER CHEST: No pneumothorax or pleural effusion. No nodules or masses. AORTIC ARCH: There is no calcific atherosclerosis of the aortic arch. There is no aneurysm, dissection or hemodynamically significant stenosis of the visualized portion of the aorta. Conventional 3 vessel aortic branching pattern. The visualized proximal subclavian arteries are widely patent. RIGHT CAROTID SYSTEM: Normal without aneurysm, dissection or stenosis. LEFT CAROTID SYSTEM: Normal without aneurysm, dissection or stenosis. VERTEBRAL ARTERIES: Left dominant configuration. Both origins are clearly patent. There is no  dissection, occlusion or flow-limiting stenosis to the skull base (V1-V3 segments). CTA HEAD FINDINGS POSTERIOR CIRCULATION: --Vertebral arteries: Normal V4 segments. --Inferior cerebellar arteries: Normal. --Basilar artery: Normal. --Superior cerebellar arteries: Normal. --Posterior cerebral arteries (PCA): Normal. ANTERIOR CIRCULATION: --Intracranial internal carotid arteries: Normal. --Anterior cerebral arteries (ACA): Normal. Absent left A1 segment, normal variant --Middle cerebral arteries (MCA): Normal. VENOUS SINUSES: As permitted by contrast timing, patent. ANATOMIC VARIANTS: Absent left ACA A1 segment, a common variant. Review of the MIP images confirms the above findings. IMPRESSION: 1. No emergent large vessel occlusion or hemodynamically significant stenosis of the head or neck. 2. 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 3. 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 Based on size, histologic sampling should be considered. Electronically Signed   By: Deatra Robinson M.D.   On: 02/14/2023 00:34   MR BRAIN WO CONTRAST  Result Date: 02/13/2023 CLINICAL DATA:   Aphasia EXAM: MRI HEAD WITHOUT CONTRAST TECHNIQUE: Multiplanar, multiecho pulse sequences of the brain and surrounding structures were obtained without intravenous contrast. COMPARISON:  None Available. FINDINGS: Brain: No acute infarct, mass effect or extra-axial collection. Fewer than 5 scattered microhemorrhages in a nonspecific pattern. There is multifocal hyperintense T2-weighted signal within the white matter. Generalized volume loss. 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 Normal midline structures. Vascular: Major flow voids are preserved. Skull and upper cervical spine: Normal calvarium and skull base. Visualized upper cervical spine and soft tissues are normal. Sinuses/Orbits:Chronic left maxillary sinusitis.  Normal orbits. IMPRESSION: 1. No acute intracranial abnormality. 2. 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 Electronically Signed   By: Deatra Robinson M.D.   On: 02/13/2023 21:52   DG Chest 2 View  Result Date: 02/13/2023 CLINICAL DATA:  Fatigue, lightheaded, rule out infection. Weakness and jittery EXAM: CHEST - 2 VIEW COMPARISON:  CT chest 06/09/2021 and radiograph 09/15/2020 FINDINGS: Stable cardiomediastinal silhouette. Aortic atherosclerotic calcification. Left basilar atelectasis. No focal consolidation, pleural effusion, or pneumothorax. Severe dextroscoliosis, unchanged. IMPRESSION: No active cardiopulmonary disease. Electronically Signed   By: Minerva Fester M.D.   On: 02/13/2023 20:39    Microbiology: Results for orders placed or performed in visit on 10/07/19  Novel Coronavirus, NAA ([facility])     Status: None   Collection Time: 10/07/19 10:42 AM   Specimen: Nasopharyngeal(NP) swabs in vial transport medium   NASOPHARYNGE  TESTING  Result Value Ref Range Status   SARS-CoV-2, NAA Not Detected Not Detected Final    Comment: This nucleic acid amplification test was developed and its performance characteristics determined by [employer]. Nucleic acid amplification tests include RT-PCR and TMA. This test has not been FDA cleared or approved. This test has been authorized by FDA under an Emergency Use Authorization (EUA). This test is only authorized for the duration of time the declaration that circumstances exist justifying the authorization of the emergency use of in vitro diagnostic tests for detection of SARS-CoV-2 virus and/or diagnosis of COVID-19 infection under section 564(b)(1) of the Act, 21 U.S.C. 601UXN-2(T) (1), unless the authorization is terminated or revoked sooner. When diagnostic testing is negative, the possibility of a false negative result should be considered in the context of a patient's recent exposures and the presence of clinical signs and symptoms consistent with COVID-19. An individual without symptoms of COVID-19 and who is not shedding SARS-CoV-2 virus wo uld expect to have a negative (not detected) result in this assay.     Labs: CBC: Recent  Labs  Lab 02/13/23 1520 02/14/23 0217  WBC 7.3 6.3  HGB 15.1 14.4  HCT 45.8 42.6  MCV 98.7 97.9  PLT 152 129*   Basic Metabolic Panel: Recent Labs  Lab 02/13/23 1520 02/13/23 2020 02/14/23 0217  NA 139  --  138  K 3.8  --  3.6  CL 107  --  104  CO2 23  --  22  GLUCOSE 82  --  133*  BUN 18  --  15  CREATININE 1.08  --  0.99  CALCIUM 8.8*  --  8.7*  MG  --  2.1  --    Liver Function Tests: Recent Labs  Lab 02/13/23 2020 02/14/23 0217  AST 23 22  ALT 34 33  ALKPHOS 54 49  BILITOT 1.6* 1.7*  PROT 6.3* 5.8*  ALBUMIN 3.5 3.2*   CBG: Recent Labs  Lab 02/13/23 1538 02/14/23 0305  GLUCAP 101* 131*    Discharge time spent: approximately 35 minutes spent on discharge counseling, evaluation of patient on day of discharge, and coordination of discharge planning with nursing, social work, pharmacy and case management  Signed: Alberteen Sam, MD [facility] 02/17/2023

## 2023-02-23 ENCOUNTER — Encounter: Payer: Self-pay | Admitting: Neurology

## 2023-02-23 ENCOUNTER — Ambulatory Visit: Payer: Medicare HMO | Admitting: Neurology

## 2023-02-23 VITALS — BP 129/74 | HR 64 | Ht 65.0 in | Wt 171.0 lb

## 2023-02-23 DIAGNOSIS — R4701 Aphasia: Secondary | ICD-10-CM

## 2023-02-23 DIAGNOSIS — G459 Transient cerebral ischemic attack, unspecified: Secondary | ICD-10-CM | POA: Diagnosis not present

## 2023-02-23 NOTE — Patient Instructions (Signed)
I had a long d/w patient about his recent stroke, risk for recurrent stroke/TIAs, personally independently reviewed imaging studies and stroke evaluation results and answered questions.Continue aspirin 81 mg daily and clopidogrel 75 mg daily  for secondary stroke prevention for 2 more weeks and then stop plavix and stay on aspirin alone.and maintain strict control of hypertension with blood pressure goal below 130/90, diabetes with hemoglobin A1c goal below 6.5% and lipids with LDL cholesterol goal below 70 mg/dL. I also advised the patient to eat a healthy diet with plenty of whole grains, cereals, fruits and vegetables, exercise regularly and maintain ideal body weight  Refer to electrophysiology for loop recorder for PAF.Followup in the future with my NP in 6 months  Stroke Prevention Some medical conditions and behaviors can lead to a higher chance of having a stroke. You can help prevent a stroke by eating healthy, exercising, not smoking, and managing any medical conditions you have. Stroke is a leading cause of functional impairment. Primary prevention is particularly important because a majority of strokes are first-time events. Stroke changes the lives of not only those who experience a stroke but also their family and other caregivers. How can this condition affect me? A stroke is a medical emergency and should be treated right away. A stroke can lead to brain damage and can sometimes be life-threatening. If a person gets medical treatment right away, there is a better chance of surviving and recovering from a stroke. What can increase my risk? The following medical conditions may increase your risk of a stroke: Cardiovascular disease. High blood pressure (hypertension). Diabetes. High cholesterol. Sickle cell disease. Blood clotting disorders (hypercoagulable state). Obesity. Sleep disorders (obstructive sleep apnea). Other risk factors include: Being older than age 65. Having a history  of blood clots, stroke, or mini-stroke (transient ischemic attack, TIA). Genetic factors, such as race, ethnicity, or a family history of stroke. Smoking cigarettes or using other tobacco products. Taking birth control pills, especially if you also use tobacco. Heavy use of alcohol or drugs, especially cocaine and methamphetamine. Physical inactivity. What actions can I take to prevent this? Manage your health conditions High cholesterol levels. Eating a healthy diet is important for preventing high cholesterol. If cholesterol cannot be managed through diet alone, you may need to take medicines. Take any prescribed medicines to control your cholesterol as told by your health care provider. Hypertension. To reduce your risk of stroke, try to keep your blood pressure below 130/80. Eating a healthy diet and exercising regularly are important for controlling blood pressure. If these steps are not enough to manage your blood pressure, you may need to take medicines. Take any prescribed medicines to control hypertension as told by your health care provider. Ask your health care provider if you should monitor your blood pressure at home. Have your blood pressure checked every year, even if your blood pressure is normal. Blood pressure increases with age and some medical conditions. Diabetes. Eating a healthy diet and exercising regularly are important parts of managing your blood sugar (glucose). If your blood sugar cannot be managed through diet and exercise, you may need to take medicines. Take any prescribed medicines to control your diabetes as told by your health care provider. Get evaluated for obstructive sleep apnea. Talk to your health care provider about getting a sleep evaluation if you snore a lot or have excessive sleepiness. Make sure that any other medical conditions you have, such as atrial fibrillation or atherosclerosis, are managed. Nutrition Follow instructions from  your health  care provider about what to eat or drink to help manage your health condition. These instructions may include: Reducing your daily calorie intake. Limiting how much salt (sodium) you use to 1,500 milligrams (mg) each day. Using only healthy fats for cooking, such as olive oil, canola oil, or sunflower oil. Eating healthy foods. You can do this by: Choosing foods that are high in fiber, such as whole grains, and fresh fruits and vegetables. Eating at least 5 servings of fruits and vegetables a day. Try to fill one-half of your plate with fruits and vegetables at each meal. Choosing lean protein foods, such as lean cuts of meat, poultry without skin, fish, tofu, beans, and nuts. Eating low-fat dairy products. Avoiding foods that are high in sodium. This can help lower blood pressure. Avoiding foods that have saturated fat, trans fat, and cholesterol. This can help prevent high cholesterol. Avoiding processed and prepared foods. Counting your daily carbohydrate intake.  Lifestyle If you drink alcohol: Limit how much you have to: 0-1 drink a day for women who are not pregnant. 0-2 drinks a day for men. Know how much alcohol is in your drink. In the [place] one drink equals one 12 oz bottle of beer ( ), one 5 oz glass of wine ( ), or one 1 oz glass of hard liquor (44mL). Do not use any products that contain nicotine or tobacco. These products include cigarettes, chewing tobacco, and vaping devices, such as e-cigarettes. If you need help quitting, ask your health care provider. Avoid secondhand smoke. Do not use drugs. Activity  Try to stay at a healthy weight. Get at least 30 minutes of exercise on most days, such as: Fast walking. Biking. Swimming. Medicines Take over-the-counter and prescription medicines only as told by your health care provider. Aspirin or blood thinners (antiplatelets or anticoagulants) may be recommended to reduce your risk of forming blood clots that can lead  to stroke. Avoid taking birth control pills. Talk to your health care provider about the risks of taking birth control pills if: You are over 38 years old. You smoke. You get very bad headaches. You have had a blood clot. Where to find more information American Stroke Association: www.strokeassociation.org Get help right away if: You or a loved one has any symptoms of a stroke. "BE FAST" is an easy way to remember the main warning signs of a stroke: B - Balance. Signs are dizziness, sudden trouble walking, or loss of balance. E - Eyes. Signs are trouble seeing or a sudden change in vision. F - Face. Signs are sudden weakness or numbness of the face, or the face or eyelid drooping on one side. A - Arms. Signs are weakness or numbness in an arm. This happens suddenly and usually on one side of the body. S - Speech. Signs are sudden trouble speaking, slurred speech, or trouble understanding what people say. T - Time. Time to call emergency services. Write down what time symptoms started. You or a loved one has other signs of a stroke, such as: A sudden, severe headache with no known cause. Nausea or vomiting. Seizure. These symptoms may represent a serious problem that is an emergency. Do not wait to see if the symptoms will go away. Get medical help right away. Call your local emergency services (911 in the [place] Do not drive yourself to the hospital. Summary You can help to prevent a stroke by eating healthy, exercising, not smoking, limiting alcohol intake, and managing any medical conditions you  may have. Do not use any products that contain nicotine or tobacco. These include cigarettes, chewing tobacco, and vaping devices, such as e-cigarettes. If you need help quitting, ask your health care provider. Remember "BE FAST" for warning signs of a stroke. Get help right away if you or a loved one has any of these signs. This information is not intended to replace advice given to you by your  health care provider. Make sure you discuss any questions you have with your health care provider. Document Revised: 07/18/2022 Document Reviewed: 07/18/2022 Elsevier Patient Education  2024 [employer].

## 2023-02-23 NOTE — Progress Notes (Signed)
[facility] [address] [place]. [place] [address] [phone]       OFFICE FOLLOW-UP NOTE  [name] Date of Birth:  08/30/1950 Medical Record Number:  [id number]   HPI: [name] is a 72 year old male seen today for office follow-up visit following hospital for TIA.  History is obtained from the patient and review of electronic medical records.  I personally reviewed pertinent available imaging films in PACS.Duane Reyes is a 72 y.o. male with PMH significant for CHF, GERD, HTN, HLD, hypothyroidism, PVCs, GERD, chronic DOE, who presented on 02/13/2023 with transient episode of word finding difficulty.  He got up that morning fine.  He went outside and tried to [place] the lawn.  He was outside for an hour he felt tired and came back and sat down.  He called his girlfriend was having trouble getting his words out.  He had to struggle to get the words out and they were slurred and at times he ended up using wrong words.  He denied headache or other focal neurological symptoms.  He was fully aware of his surroundings .  He was not confused.  Words appeared to be quite slurred .the episode appeared to be over by the time his girlfriend came over in 35 minutes He felt episode lasted about 20 minutes.  He denies any prior history of strokes TIAs seizures or significant neurological problems.  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  CT angiogram of the brain and neck showed no large vessel stenosis or occlusion.  EEG was normal without any seizure activity.  Echocardiogram showed ejection fraction of 45 to 50%.  Left atrial size was normal.  Hemoglobin A1c was 5.3.  LDL cholesterol was 60 mg percent.  Ammonia level and TSH were normal.  Patient states is done well since discharge..No further recurrent TIA or strokelike episodes.[name]  He is tolerating aspirin and Plavix well but does bruise easily.  He is  tolerating Lipitor well without muscle aches and pains.  States his blood pressure is under good control.  He denies any history of atrial fibrillation but does admit to having palpitations and  ectopics in the past.  He has never had cardiac monitoring.  ROS:   14 system review of systems is positive for speech difficulty, palpitations, word hesitancy, all other systems negative  PMH:  Past Medical History:  Diagnosis Date   Arthritis    "left knee" (01/29/2016)   Bradycardia, drug induced 10/26/2016   Bronchiolitis    CAD in native artery    a. Abnl cardiac CT ; b. LHC 01/2016 s/p 50% mLAD, 25% OM2, 95% RPDA which was treated with overlapping DES; c. MV 03/2021 negative for ischemia   Chronic combined systolic and diastolic heart failure (HCC)    HFmrEF, echo 03/2021: EF 45-50%, GIDD.   Chronic knee pain    Chronic lower back pain    DOE (dyspnea on exertion)    Dyspnea    Frequent PVCs    a. 12/2015 - Holter showed SB, NSR, ST, HR 54-108, with frequent PVCs in singles, couplets, and bigemy, with elevated PVC load 24%.   GERD (gastroesophageal reflux disease)    History of shingles    Hyperlipidemia    Hypertension    Hypothyroidism    Prostate cancer (HCC)    Scoliosis    Spinal stenosis    Spondylolisthesis of lumbar region  Tortuosity of aortic arch    Tortuosity of aortic arch, -would not pursue Rt radial approach in this patient for future cardiac caths     Social History:  Social History   Socioeconomic History   Marital status: Married    Spouse name: Not on file   Number of children: Not on file   Years of education: Not on file   Highest education level: Not on file  Occupational History   Not on file  Tobacco Use   Smoking status: Never   Smokeless tobacco: Never  Vaping Use   Vaping Use: Never used  Substance and Sexual Activity   Alcohol use: Yes    Comment: 01/29/2016 "glass of wine q couple months"   Drug use: No   Sexual activity: Not Currently   Other Topics Concern   Not on file  Social History Narrative   Not on file   Social Determinants of Health   Financial Resource Strain: Not on file  Food Insecurity: No Food Insecurity (02/14/2023)   Hunger Vital Sign    Worried About Running Out of Food in the Last Year: Never true    Ran Out of Food in the Last Year: Never true  Transportation Needs: No Transportation Needs (02/14/2023)   PRAPARE - Administrator, Civil Service (Medical): No    Lack of Transportation (Non-Medical): No  Physical Activity: Not on file  Stress: Not on file  Social Connections: Not on file  Intimate Partner Violence: Not At Risk (02/14/2023)   Humiliation, Afraid, Rape, and Kick questionnaire    Fear of Current or Ex-Partner: No    Emotionally Abused: No    Physically Abused: No    Sexually Abused: No    Medications:   Current Outpatient Medications on File Prior to Visit  Medication Sig Dispense Refill   acetaminophen (TYLENOL) 500 MG tablet Take 500 mg by mouth every 6 (six) hours as needed.     albuterol (PROAIR HFA) 108 (90 Base) MCG/ACT inhaler Inhale 2 puffs into the lungs every 6 (six) hours as needed (for wheezing or shortness of breath). 18 g 6   aspirin EC 81 MG tablet Take 1 tablet (81 mg total) by mouth daily. Swallow whole. 30 tablet 12   atorvastatin (LIPITOR) 80 MG tablet Take 1 tablet (80 mg total) by mouth daily. 90 tablet 3   baclofen (LIORESAL) 10 MG tablet Take 10 mg by mouth as needed.     budesonide-formoterol (SYMBICORT) 80-4.5 MCG/ACT inhaler Inhale 2 puffs into the lungs 2 (two) times daily as needed (for flares).      Cholecalciferol (VITAMIN D-3 PO) Take 1 capsule by mouth 2 (two) times a week.     clopidogrel (PLAVIX) 75 MG tablet TAKE 1 TABLET DAILY WITH BREAKFAST (Patient taking differently: Take 75 mg by mouth daily with breakfast.) 90 tablet 3   Coenzyme Q10 (CO Q-10) 120 MG CAPS Take 120 mg by mouth 3 (three) times daily.     diclofenac Sodium (VOLTAREN)  1 % GEL Apply 1 application. topically. 3-4 times daily     gabapentin (NEURONTIN) 300 MG capsule Take 300 mg by mouth 5 (five) times daily.     hydrocortisone 2.5 % cream Apply 1 Application topically as needed (for ankle).     levothyroxine (SYNTHROID, LEVOTHROID) 125 MCG tablet Take 125 mcg by mouth daily before breakfast.     metoprolol succinate (TOPROL-XL) 50 MG 24 hr tablet TAKE 1 TABLET EVERY DAY WITH OR IMMEDIATELY  FOLLOWING A MEAL. DISCONTINUE VERAPAMIL (Patient taking differently: Take 50 mg by mouth daily.) 90 tablet 2   mexiletine (MEXITIL) 150 MG capsule Take 2 capsules (300 mg total) by mouth 2 (two) times daily. 360 capsule 2   montelukast (SINGULAIR) 10 MG tablet Take 10 mg by mouth daily.     Multiple Vitamins-Minerals (PRESERVISION AREDS 2 PO) Take 1 capsule by mouth 2 (two) times daily.     nitroGLYCERIN (NITROSTAT) 0.4 MG SL tablet Place 1 tablet (0.4 mg total) under the tongue every 5 (five) minutes as needed for chest pain (x 3 doses). 25 tablet 0   pantoprazole (PROTONIX) 40 MG tablet Take 1 tablet (40 mg total) by mouth daily. 90 tablet 3   prednisoLONE acetate (PRED FORTE) 1 % ophthalmic suspension Place 1 drop into the left eye 4 (four) times daily.     sildenafil (REVATIO) 20 MG tablet Take 1-5 tablets by mouth daily as needed.     traMADol (ULTRAM) 50 MG tablet Take 50 mg by mouth every 6 (six) hours as needed for moderate pain.     No current facility-administered medications on file prior to visit.    Allergies:   Allergies  Allergen Reactions   Topiramate Anxiety, Palpitations and Other (See Comments)    blurred vision, tired, ringing in ears, restlessness, confusion, rapid heart rate, made him jittery   Celecoxib Other (See Comments)    GI Upset; did not mix with his Nexium/Caused acid relux     Pitavastatin Rash and Cough    LIVALO    Rosuvastatin Calcium Other (See Comments)    Muscle aches    Hydrocodone-Acetaminophen Other (See Comments)     Delusions     Physical Exam General: well developed, well nourished, seated, in no evident distress Head: head normocephalic and atraumatic.  Neck: supple with no carotid or supraclavicular bruits Cardiovascular: regular rate and rhythm, no murmurs Musculoskeletal: no deformity Skin:  no rash/petichiae Vascular:  Normal pulses all extremities Vitals:   02/23/23 1024  BP: 129/74  Pulse: 64   Neurologic Exam Mental Status: Awake and fully alert. Oriented to place and time. Recent and remote memory intact. Attention span, concentration and fund of knowledge appropriate. Mood and affect appropriate.  Speech is clear without aphasia or dysarthria.  Good naming, repetition and comprehension. Cranial Nerves: Fundoscopic exam reveals sharp disc margins. Pupils equal, briskly reactive to light. Extraocular movements full without nystagmus. Visual fields full to confrontation. Hearing intact. Facial sensation intact. Face, tongue, palate moves normally and symmetrically.  Motor: Normal bulk and tone. Normal strength in all tested extremity muscles. Sensory.: intact to touch ,pinprick .position and vibratory sensation.  Coordination: Rapid alternating movements normal in all extremities. Finger-to-nose and heel-to-shin performed accurately bilaterally. Gait and Station: Arises from chair without difficulty. Stance is normal. Gait demonstrates normal stride length and balance . Able to heel, toe and tandem walk without difficulty.  Reflexes: 1+ and symmetric. Toes downgoing.   NIHSS  0 Modified Rankin  0   ASSESSMENT: 72 year old male with transient episode of expressive aphasia likely left hemispheric TIA June 2024.  Vascular risk factors of coronary artery disease, hypertension, hyperlipidemia     PLAN:I had a long d/w patient about his recent stroke, risk for recurrent stroke/TIAs, personally independently reviewed imaging studies and stroke evaluation results and answered questions.Continue  aspirin 81 mg daily and clopidogrel 75 mg daily  for secondary stroke prevention for 2 more weeks and then stop plavix and stay on aspirin alone.and maintain  strict control of hypertension with blood pressure goal below 130/90, diabetes with hemoglobin A1c goal below 6.5% and lipids with LDL cholesterol goal below 70 mg/dL. I also advised the patient to eat a healthy diet with plenty of whole grains, cereals, fruits and vegetables, exercise regularly and maintain ideal body weight  Refer to electrophysiology for loop recorder for PAF.Followup in the future with my NP in 6 months Greater than 50% of time during this 35 minute visit was spent on counseling,explanation of diagnosis, planning of further management, discussion with patient and family and coordination of care Delia Heady, MD Note: This document was prepared with digital dictation and possible smart phrase technology. Any transcriptional errors that result from this process are unintentional

## 2023-03-31 ENCOUNTER — Encounter: Payer: Self-pay | Admitting: Gastroenterology

## 2023-05-02 ENCOUNTER — Telehealth: Payer: Self-pay

## 2023-05-02 ENCOUNTER — Telehealth: Payer: Self-pay | Admitting: *Deleted

## 2023-05-02 ENCOUNTER — Ambulatory Visit: Payer: Medicare HMO | Attending: Cardiology | Admitting: Cardiology

## 2023-05-02 ENCOUNTER — Encounter: Payer: Self-pay | Admitting: Cardiology

## 2023-05-02 VITALS — BP 124/76 | HR 60 | Ht 65.0 in | Wt 174.8 lb

## 2023-05-02 DIAGNOSIS — R001 Bradycardia, unspecified: Secondary | ICD-10-CM

## 2023-05-02 DIAGNOSIS — G459 Transient cerebral ischemic attack, unspecified: Secondary | ICD-10-CM | POA: Diagnosis not present

## 2023-05-02 DIAGNOSIS — I493 Ventricular premature depolarization: Secondary | ICD-10-CM | POA: Diagnosis not present

## 2023-05-02 DIAGNOSIS — R400 Somnolence: Secondary | ICD-10-CM

## 2023-05-02 DIAGNOSIS — I2510 Atherosclerotic heart disease of native coronary artery without angina pectoris: Secondary | ICD-10-CM

## 2023-05-02 DIAGNOSIS — R0683 Snoring: Secondary | ICD-10-CM

## 2023-05-02 HISTORY — PX: LOOP RECORDER IMPLANT: SHX5954

## 2023-05-02 NOTE — Telephone Encounter (Signed)
Clearance sent 

## 2023-05-02 NOTE — Patient Instructions (Signed)
Medication Instructions:  Your physician recommends that you continue on your current medications as directed. Please refer to the Current Medication list given to you today.  *If you need a refill on your cardiac medications before your next appointment, please call your pharmacy*   Lab Work: None ordered   Testing/Procedures: Your physician has recommended that you have a sleep study. This test records several body functions during sleep, including: brain activity, eye movement, oxygen and carbon dioxide blood levels, heart rate and rhythm, breathing rate and rhythm, the flow of air through your mouth and nose, snoring, body muscle movements, and chest and belly movement.   Follow-Up: At [facility], you and your health needs are our priority.  As part of our continuing mission to provide you with exceptional heart care, we have created designated Provider Care Teams.  These Care Teams include your primary Cardiologist (physician) and Advanced Practice Providers (APPs -  Physician Assistants and Nurse Practitioners) who all work together to provide you with the care you need, when you need it.   Your next appointment:   6 month(s)  The format for your next appointment:   In Person  Provider:   You will see one of the following Advanced Practice Providers on your designated Care Team:   Francis Dowse, [place] "Mardelle Matte" [employer], [place] Canary Brim, NP     Thank you for choosing [facility]!!   Dory Horn, RN [phone]  Other Instructions    Implantable Loop Recorder Placement, Care After This sheet gives you information about how to care for yourself after your procedure. Your health care provider may also give you more specific instructions. If you have problems or questions, contact your health care provider. What can I expect after the procedure? After the procedure, it is common to have: Soreness or discomfort near the incision. Some swelling or bruising  near the incision.  Follow these instructions at home: Incision care  Monitor your cardiac device site for redness, swelling, and drainage. Call the device clinic at [phone] if you experience these symptoms or fever/chills.  Keep the large square bandage on your site for 24 hours and then you may remove it yourself. Keep the steri-strips underneath in place.   You may shower after 72 hours / 3 days from your procedure with the steri-strips in place. They will usually fall off on their own, or may be removed after 10 days. Pat dry.   Avoid lotions, ointments, or perfumes over your incision until it is well-healed.  Please do not submerge in water until your site is completely healed.   Your device is MRI compatible.   Remote monitoring is used to monitor your cardiac device from home. This monitoring is scheduled every month by our office. It allows [place] to keep an eye on the function of your device to ensure it is working properly.  If your wound site starts to bleed apply pressure.    For help with the monitor please call [employer] Monitor Support Specialist directly at [phone].    If you have any questions/concerns please call the device clinic at [phone].  Activity  Return to your normal activities.  General instructions Follow instructions from your health care provider about how to manage your implantable loop recorder and transmit the information. Learn how to activate a recording if this is necessary for your type of device. You may go through a metal detection gate, and you may let someone hold a metal detector over your chest.  Show your ID card if needed. Do not have an MRI unless you check with your health care provider first. Take over-the-counter and prescription medicines only as told by your health care provider. Keep all follow-up visits as told by your health care provider. This is important. Contact a health care provider if: You have redness,  swelling, or pain around your incision. You have a fever. You have pain that is not relieved by your pain medicine. You have triggered your device because of fainting (syncope) or because of a heartbeat that feels like it is racing, slow, fluttering, or skipping (palpitations). Get help right away if you have: Chest pain. Difficulty breathing. Summary After the procedure, it is common to have soreness or discomfort near the incision. Change your dressing as told by your health care provider. Follow instructions from your health care provider about how to manage your implantable loop recorder and transmit the information. Keep all follow-up visits as told by your health care provider. This is important. This information is not intended to replace advice given to you by your health care provider. Make sure you discuss any questions you have with your health care provider. Document Released: 07/27/2015 Document Revised: 09/30/2017 Document Reviewed: 09/30/2017 Elsevier Patient Education  2020 [employer].

## 2023-05-02 NOTE — Telephone Encounter (Signed)
Please obtain Plavix clearance, Thanks!

## 2023-05-02 NOTE — Progress Notes (Signed)
Electrophysiology Office Note:   Date:  05/02/2023  ID:  JACOBI Reyes, DOB Jun 30, 1951, MRN [id number]  Primary Cardiologist: Armanda Magic, MD Electrophysiologist: Regan Lemming, MD      History of Present Illness:   Duane Reyes is a 72 y.o. male with h/o coronary artery disease, PVCs seen today for routine electrophysiology followup.  Since last being seen in our clinic the patient reports doing overall well since his TIA.  He continues to have short episodes of speech finding difficulties.  He is also noted increased fatigue.  He falls asleep and has to take naps throughout the day.  Per his wife, he does snore and has apneic episodes at night.  he denies chest pain, palpitations, dyspnea, PND, orthopnea, nausea, vomiting, dizziness, syncope, edema, weight gain, or early satiety.      He has a history seen for coronary artery disease with CTA showing significant calcification. Left heart catheterization in 2017 showed significant coronary artery disease with a 95% PDA post PCI. He has a history of PVCs. He was initially on Cardizem but this was discontinued due to fatigue and bradycardia. He wore a cardiac monitor that showed a 46% PVC burden. He is now on mexiletine with an improvement in his PVCs.    He presented 02/03/2023 with a transient episode of word finding difficulty.  He got up that morning feeling fine.  He went out to mow the lawn and felt tired.  Per his girlfriend, he was having word finding difficulties at that time.  He was not confused.  He felt the episode lasted approximately 20 minutes.  MRI showed old lacunar infarcts but no acute issue.      Review of systems complete and found to be negative unless listed in HPI.   EP Information / Studies Reviewed:    EKG is ordered today. Personal review as below.  EKG Interpretation Date/Time:  Tuesday May 02 2023 08:23:06 EDT Ventricular Rate:  60 PR Interval:  196 QRS Duration:  94 QT Interval:  398 QTC  Calculation: 398 R Axis:   -30  Text Interpretation: Sinus rhythm with occasional Premature ventricular complexes Left axis deviation Incomplete right bundle branch block Inferior infarct , age undetermined When compared with ECG of 13-Feb-2023 22:05, No significant change since last tracing Confirmed by [facility], Tracker Mance ([id number]) on 05/02/2023 8:38:31 AM     Risk Assessment/Calculations:              Physical Exam:   VS:  BP 124/76 (BP Location: Right Arm, Patient Position: Sitting, Cuff Size: Normal)   Pulse 60   Ht 5\' 5"  (1.651 m)   Wt 174 lb 12.8 oz (79.3 kg)   SpO2 98%   BMI 29.09 kg/m    Wt Readings from Last 3 Encounters:  05/02/23 174 lb 12.8 oz (79.3 kg)  02/23/23 171 lb (77.6 kg)  02/13/23 167 lb (75.8 kg)     GEN: Well nourished, well developed in no acute distress NECK: No JVD; No carotid bruits CARDIAC: Regular rate and rhythm, no murmurs, rubs, gallops RESPIRATORY:  Clear to auscultation without rales, wheezing or rhonchi  ABDOMEN: Soft, non-tender, non-distended EXTREMITIES:  No edema; No deformity   ASSESSMENT AND PLAN:    1.  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  Low burden.  Continue current management.  2.  Coronary artery disease: Has chronic stable angina.  Post RCA PCI.  No current chest pain.  3.  Hypertension: Currently well-controlled  4.  Hypothyroidism: Currently on Synthroid.  Attempting to avoid amiodarone.  5. TIA: No obvious cause found.  He would benefit from further monitoring for atrial fibrillation via ILR.  Risks and benefits have been discussed.  Risk include bleeding and infection.  He understands the risks and is agreed to the procedure.  6.  Daytime fatigue: Patient overweight by BMI and has daytime fatigue as well as snoring and witnessed apnea.  [name] plan for sleep study.  Follow up with EP APP in 6 months  Signed, Ankur Snowdon Jorja Loa, MD   SURGEON:  Zayvien Canning Jorja Loa, MD     PREPROCEDURE DIAGNOSIS:  Cryptogenic stroke     POSTPROCEDURE DIAGNOSIS: Cryptogenic stroke     PROCEDURES:   1. Implantable loop recorder implantation    INTRODUCTION:  [name] presents with a history of cryptogenic stroke The costs of loop recorder monitoring have been discussed with the patient.    DESCRIPTION OF PROCEDURE:  Informed written consent was obtained.   Time Out Completed with RN    The patient required no sedation for the procedure today.  Mapping over the patient's chest was performed to identify the area where electrograms were most prominent for ILR recording.  This area was found to be the left parasternal region over the 4th intercostal space. The patients left chest was therefore prepped and draped in the usual sterile fashion. The skin overlying the left parasternal region was infiltrated with lidocaine for local analgesia.  A 0.5-cm incision was made over the left parasternal region over the 3rd intercostal space.  A subcutaneous ILR pocket was fashioned using a combination of sharp and blunt dissection.  A Medtronic Reveal LINQ 2 implantable loop recorder (serial # [id number]) was then placed into the pocket  R waves were very prominent and measuring  0.27 .  Steri- Strips and a sterile dressing were then applied.  There were no early apparent complications.     CONCLUSIONS:   1. Successful implantation of a implantable loop recorder for a history of cryptogenic stroke  2. No early apparent complications.   [name] Jorja Loa, MD  Cardiac Electrophysiology

## 2023-05-02 NOTE — Telephone Encounter (Signed)
   Patient Name: Duane Reyes  DOB: [birth year] MRN: [id number]  Primary Cardiologist: Armanda Magic, MD  Chart reviewed as part of pre-operative protocol coverage.   Patient was last seen in the office on 05/02/2023 and was stable from a cardiac standpoint.  From a cardiology standpoint, patient may hold Plavix for 5 days prior to procedure.  However, patient also takes Plavix for history of recent CVA, following with neurology.  Therefore, final recommendations for holding Plavix prior to procedure should come from neurology as well.  I will route this recommendation to the requesting party via Epic fax function and remove from pre-op pool.  Please call with questions.  Joylene Grapes, NP 05/02/2023, 12:14 PM

## 2023-05-02 NOTE — Telephone Encounter (Signed)
Waiver signed on 05/02/2023. WatchPAT issued to patient on 05/02/2023 by Danielle Rankin, CMA. Patient profile initialized in CloudPAT on 05/02/2023 by Danielle Rankin, CMA. Device serial number: [id number] PT AWARE TO NOT OPEN THE BOX UNTIL CALLED WITH PIN# PT AGREEABLE TO SIGNED WAIVER  Please list Reason for Call as Advice Only and type "WatchPAT issued to patient" in the comment box.

## 2023-05-02 NOTE — Telephone Encounter (Signed)
Request for surgical clearance:     Endoscopy Procedure  What type of surgery is being performed?     Colonoscopy  When is this surgery scheduled?     05/18/2023  What type of clearance is required ?   Pharmacy  Are there any medications that need to be held prior to surgery and how long? Plavix x5 days prior to procedure  Practice name and name of physician performing surgery?      [facility]-Dr Danis   What is your office phone and fax number?      Phone- [phone]  Fax- [phone]  Anesthesia type (None, local, MAC, general) ?       MAC

## 2023-05-03 ENCOUNTER — Telehealth: Payer: Self-pay | Admitting: Cardiology

## 2023-05-03 NOTE — Telephone Encounter (Signed)
Called the pt back and went over ILR after care instructions with him on the phone.   Dr. Elberta Fortis RN also spoke with the pts wife about this around the same time I was conversing with the pt.  Pt is aware to keep the large square bandage on the site for 24 hours them he may remove it after that.   Advised him to leave the steri strips underneath alone and keep them in place.  He is aware they will more than likely fall off on their own, or may be removed after 10 days.   Pt verbalized understanding and agrees with this plan.

## 2023-05-03 NOTE — Telephone Encounter (Signed)
Okey Regal (pt spouse) Requesting a callback to make sure she's removing the correct bandage. Please advise

## 2023-05-03 NOTE — Telephone Encounter (Signed)
Wife verifying to take only the outer clear dsg off and leave steri-strips Advised this is correct. She appreciates the information/confirmation.

## 2023-05-03 NOTE — Telephone Encounter (Signed)
Patient's spouse is calling back to make sure she's removing the correct bandage.

## 2023-05-05 ENCOUNTER — Ambulatory Visit (AMBULATORY_SURGERY_CENTER): Payer: Medicare HMO

## 2023-05-05 ENCOUNTER — Encounter: Payer: Self-pay | Admitting: Gastroenterology

## 2023-05-05 VITALS — Ht 66.0 in | Wt 172.0 lb

## 2023-05-05 DIAGNOSIS — Z86010 Personal history of colonic polyps: Secondary | ICD-10-CM

## 2023-05-05 MED ORDER — PEG 3350-KCL-NA BICARB-NACL 420 G PO SOLR: 4000.0000 mL | Freq: Once | ORAL | 0 refills | Status: AC

## 2023-05-05 NOTE — Progress Notes (Signed)
Pre visit completed in person; Patient verified name, DOB, and address;  No egg or soy allergy known to patient; No issues known to pt with past sedation with any surgeries or procedures; Patient denies ever being told they had issues or difficulty with intubation;  No FH of Malignant Hyperthermia; Pt is not on diet pills; Pt is not on home 02;  Pt is not on blood thinners;  Pt denies issues with constipation;  No A fib or A flutter;  Have any cardiac testing pending--NO Insurance verified during PV appt--- [employer]   Pt can ambulate without assistance;  Pt denies use of chewing tobacco; Discussed diabetic/weight loss medication holds; Discussed NSAID holds; Checked BMI to be less than 50; Pt instructed to use Singlecare.com or GoodRx for a price reduction on prep;  Patient's chart reviewed by Cathlyn Parsons CNRA prior to previsit and patient appropriate for the [facility].  Pre visit completed and red dot placed by patient's name on their procedure day (on provider's schedule).    Instructions sent to MyChart as well as printed and given to the patient at [facility] appt;

## 2023-05-07 ENCOUNTER — Other Ambulatory Visit: Payer: Self-pay | Admitting: Cardiology

## 2023-05-15 ENCOUNTER — Inpatient Hospital Stay: Payer: Medicare HMO | Admitting: Neurology

## 2023-05-15 ENCOUNTER — Encounter (INDEPENDENT_AMBULATORY_CARE_PROVIDER_SITE_OTHER): Payer: Medicare HMO | Admitting: Ophthalmology

## 2023-05-15 DIAGNOSIS — I10 Essential (primary) hypertension: Secondary | ICD-10-CM

## 2023-05-15 DIAGNOSIS — H35033 Hypertensive retinopathy, bilateral: Secondary | ICD-10-CM

## 2023-05-15 DIAGNOSIS — H35373 Puckering of macula, bilateral: Secondary | ICD-10-CM

## 2023-05-15 DIAGNOSIS — H59033 Cystoid macular edema following cataract surgery, bilateral: Secondary | ICD-10-CM | POA: Diagnosis not present

## 2023-05-15 DIAGNOSIS — H353132 Nonexudative age-related macular degeneration, bilateral, intermediate dry stage: Secondary | ICD-10-CM

## 2023-05-15 DIAGNOSIS — H43813 Vitreous degeneration, bilateral: Secondary | ICD-10-CM

## 2023-05-15 DIAGNOSIS — H2511 Age-related nuclear cataract, right eye: Secondary | ICD-10-CM

## 2023-05-18 ENCOUNTER — Encounter: Payer: Medicare HMO | Admitting: Gastroenterology

## 2023-05-18 ENCOUNTER — Ambulatory Visit (AMBULATORY_SURGERY_CENTER): Payer: Medicare HMO | Admitting: Gastroenterology

## 2023-05-18 ENCOUNTER — Encounter: Payer: Self-pay | Admitting: Gastroenterology

## 2023-05-18 VITALS — BP 109/69 | HR 72 | Temp 97.7°F | Resp 13 | Ht 66.0 in | Wt 172.0 lb

## 2023-05-18 DIAGNOSIS — Z09 Encounter for follow-up examination after completed treatment for conditions other than malignant neoplasm: Secondary | ICD-10-CM | POA: Diagnosis not present

## 2023-05-18 DIAGNOSIS — Z86010 Personal history of colonic polyps: Secondary | ICD-10-CM

## 2023-05-18 MED ORDER — SODIUM CHLORIDE 0.9 % IV SOLN
500.0000 mL | INTRAVENOUS | Status: DC
Start: 2023-05-18 — End: 2023-05-18

## 2023-05-18 NOTE — Op Note (Signed)
[facility] Patient Name: Duane Reyes Procedure Date: 05/18/2023 11:53 AM MRN: [id number] Endoscopist: Sherilyn Cooter L. Myrtie Neither , MD, [id number] Age: 72 Referring MD:  Date of Birth: 10/14/1950 Gender: Male Account #: [id number] Procedure:                Colonoscopy Indications:              Surveillance: Personal history of colonic polyps                            (unknown histology) on last colonoscopy more than 3                            years ago                           4 diminutive polyps of unknown pathology removed on                            last colonoscopy at outside GI practice May 2021 Medicines:                Monitored Anesthesia Care Procedure:                Pre-Anesthesia Assessment:                           - Prior to the procedure, a History and Physical                            was performed, and patient medications and                            allergies were reviewed. The patient's tolerance of                            previous anesthesia was also reviewed. The risks                            and benefits of the procedure and the sedation                            options and risks were discussed with the patient.                            All questions were answered, and informed consent                            was obtained. Prior Anticoagulants: The patient has                            taken no anticoagulant or antiplatelet agents. ASA                            Grade Assessment: III - A patient with severe  systemic disease. After reviewing the risks and                            benefits, the patient was deemed in satisfactory                            condition to undergo the procedure.                           After obtaining informed consent, the colonoscope                            was passed under direct vision. Throughout the                            procedure, the patient's blood pressure, pulse,  and                            oxygen saturations were monitored continuously. The                            CF [id number] #[id number] was introduced through the anus                            and advanced to the the cecum, identified by                            appendiceal orifice and ileocecal valve. The                            colonoscopy was performed without difficulty. The                            patient tolerated the procedure well. The quality                            of the bowel preparation was good. The ileocecal                            valve, appendiceal orifice, and rectum were                            photographed. Scope In: 11:59:33 AM Scope Out: 12:12:38 PM Scope Withdrawal Time: 0 hours 9 minutes 48 seconds  Total Procedure Duration: 0 hours 13 minutes 5 seconds  Findings:                 The perianal and digital rectal examinations were                            normal.                           Repeat examination of right colon under NBI  performed.                           Multiple diverticula were found in the left colon.                           Retroflexion in the rectum was not performed due to                            narrow anatomy.                           The exam was otherwise without abnormality. Complications:            No immediate complications. Estimated Blood Loss:     Estimated blood loss: none. Impression:               - Diverticulosis in the left colon.                           - The examination was otherwise normal.                           - No specimens collected. Recommendation:           - Patient has a contact number available for                            emergencies. The signs and symptoms of potential                            delayed complications were discussed with the                            patient. Return to normal activities tomorrow.                            Written discharge  instructions were provided to the                            patient.                           - Resume previous diet.                           - Continue present medications.                           - No repeat routine screening/surveillance                            colonoscopy due to age, current guidelines, and low                            risk findings today. Aurther Harlin L. Myrtie Neither, MD 05/18/2023 12:19:01 PM This report has been signed electronically.

## 2023-05-18 NOTE — Progress Notes (Signed)
A/O x 3, gd SR's, VSS, report to RN

## 2023-05-18 NOTE — Progress Notes (Signed)
Pt's states no medical or surgical changes since previsit or office visit. 

## 2023-05-18 NOTE — Patient Instructions (Signed)
Continue present medications. No repeat routine screening/surveillance colonoscopy due to age, current guidelines, and low risk findings today.  Please read over handout about diverticulosis                                                       YOU HAD AN ENDOSCOPIC PROCEDURE TODAY AT THE  ENDOSCOPY CENTER:   Refer to the procedure report that was given to you for any specific questions about what was found during the examination.  If the procedure report does not answer your questions, please call your gastroenterologist to clarify.  If you requested that your care partner not be given the details of your procedure findings, then the procedure report has been included in a sealed envelope for you to review at your convenience later.  YOU SHOULD EXPECT: Some feelings of bloating in the abdomen. Passage of more gas than usual.  Walking can help get rid of the air that was put into your GI tract during the procedure and reduce the bloating. If you had a lower endoscopy (such as a colonoscopy or flexible sigmoidoscopy) you may notice spotting of blood in your stool or on the toilet paper. If you underwent a bowel prep for your procedure, you may not have a normal bowel movement for a few days.  Please Note:  You might notice some irritation and congestion in your nose or some drainage.  This is from the oxygen used during your procedure.  There is no need for concern and it should clear up in a day or so.  SYMPTOMS TO REPORT IMMEDIATELY:  Following lower endoscopy (colonoscopy or flexible sigmoidoscopy):  Excessive amounts of blood in the stool  Significant tenderness or worsening of abdominal pains  Swelling of the abdomen that is new, acute  Fever of 100F or higher   For urgent or emergent issues, a gastroenterologist can be reached at any hour by calling [phone]. Do not use MyChart messaging for urgent concerns.    DIET:  We do recommend a small meal at first, but then you  may proceed to your regular diet.  Drink plenty of fluids but you should avoid alcoholic beverages for 24 hours.  ACTIVITY:  You should plan to take it easy for the rest of today and you should NOT DRIVE or use heavy machinery until tomorrow (because of the sedation medicines used during the test).    FOLLOW UP: Our staff will call the number listed on your records the next business day following your procedure.  We will call around 7:15- 8:00 am to check on you and address any questions or concerns that you may have regarding the information given to you following your procedure. If we do not reach you, we will leave a message.      SIGNATURES/CONFIDENTIALITY: You and/or your care partner have signed paperwork which will be entered into your electronic medical record.  These signatures attest to the fact that that the information above on your After Visit Summary has been reviewed and is understood.  Full responsibility of the confidentiality of this discharge information lies with you and/or your care-partner.

## 2023-05-18 NOTE — Progress Notes (Signed)
History and Physical:  This patient presents for endoscopic testing for: Encounter Diagnosis  Name Primary?   Personal history of colonic polyps Yes    Surveillance colonoscopy today.  Polyps of unknown pathology at outside clinic May 2021. Patient with prior TIA.  Plavix was stopped in July 0224 and now on aspirin. Patient also has chronic constipation.  Patient is otherwise without complaints or active issues today.   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 Abnl cardiac CT ; b. LHC 01/2016 s/p 50% mLAD, 25% OM2, 95% RPDA which was treated with overlapping DES; c. MV 03/2021 negative for ischemia   Chronic combined systolic and diastolic heart failure (HCC)    HFmrEF, echo 03/2021: EF 45-50%, GIDD.   Chronic knee pain    Chronic lower back pain    DOE (dyspnea on exertion)    Dyspnea    Frequent PVCs    a. 12/2015 - Holter showed SB, NSR, ST, HR 54-108, with frequent PVCs in singles, couplets, and bigemy, with elevated PVC load 24%.   GERD (gastroesophageal reflux disease)    History of shingles    Hyperlipidemia    Hypertension    Hypothyroidism    Prostate cancer (HCC)    Scoliosis    Spinal stenosis    Spondylolisthesis of lumbar region    TIA (transient ischemic attack) 01/2023   the Monday after Father's Day in 2024- taken off of PLAVIX on 03/06/2023- added 81 mg ASA daily   Tortuosity of aortic arch    Tortuosity of aortic arch, -would not pursue Rt radial approach in this patient for future cardiac caths      Past Surgical History: Past Surgical History:  Procedure Laterality Date   ANGIOPLASTY     BACK SURGERY     CARDIAC CATHETERIZATION N/A 01/29/2016   Procedure: Right/Left Heart Cath and Coronary Angiography;  Surgeon: Corky Crafts, MD;  Location: [facility] INVASIVE CV LAB;  Service: Cardiovascular;  Laterality:  N/A;   CARDIAC CATHETERIZATION N/A 01/29/2016   Procedure: Coronary Stent Intervention 2.5/16mm Synergy-proximal PDA;  Surgeon: Corky Crafts, MD;  Location: [facility] INVASIVE CV LAB;  Service: Cardiovascular;  Laterality: N/A;   CATARACT EXTRACTION W/ INTRAOCULAR LENS IMPLANT Left 11/2015   COLONOSCOPY     CORONARY ANGIOPLASTY WITH STENT PLACEMENT  01/29/2016   "2 stents"   CYST EXCISION Right 2005   "near bicep"   ESOPHAGOGASTRODUODENOSCOPY     EYE SURGERY     INGUINAL HERNIA REPAIR     JOINT REPLACEMENT     KNEE ARTHROSCOPY Left 2012   "torn meniscus"   LOOP RECORDER IMPLANT  05/02/2023   POSTERIOR LUMBAR FUSION  02/2013   "L4-5; hardware in place"   PROSTATE BIOPSY  2013   PROSTATE SURGERY  2013   "brachytherapy for prostate radiation"   TONSILLECTOMY  1950s   TOTAL KNEE ARTHROPLASTY Left 12/18/2017   TOTAL KNEE ARTHROPLASTY Left 12/18/2017   Procedure: TOTAL KNEE ARTHROPLASTY;  Surgeon: Dannielle Huh, MD;  Location: [facility] OR;  Service: Orthopedics;  Laterality: Left;    Allergies: Allergies  Allergen Reactions   Topiramate Anxiety, Palpitations and Other (See Comments)    blurred vision, tired, ringing in ears, restlessness, confusion, rapid heart rate, made him jittery   Celecoxib Other (See Comments)    GI Upset; did not mix  with his Nexium/Caused acid relux     Pitavastatin Rash and Cough    LIVALO    Rosuvastatin Calcium Other (See Comments)    Muscle aches    Hydrocodone-Acetaminophen Other (See Comments)    Delusions     Outpatient Meds: Current Outpatient Medications  Medication Sig Dispense Refill   albuterol (PROAIR HFA) 108 (90 Base) MCG/ACT inhaler Inhale 2 puffs into the lungs every 6 (six) hours as needed (for wheezing or shortness of breath). 18 g 6   aspirin EC 81 MG tablet Take 1 tablet (81 mg total) by mouth daily. Swallow whole. 30 tablet 12   atorvastatin (LIPITOR) 80 MG tablet Take 1 tablet (80 mg total) by mouth daily. 90 tablet 3    budesonide-formoterol (SYMBICORT) 80-4.5 MCG/ACT inhaler Inhale 2 puffs into the lungs 2 (two) times daily as needed (for flares).      Cholecalciferol (VITAMIN D-3 PO) Take 1 capsule by mouth 2 (two) times a week.     Coenzyme Q10 (CO Q-10) 120 MG CAPS Take 120 mg by mouth 3 (three) times daily.     diclofenac Sodium (VOLTAREN) 1 % GEL Apply 1 application. topically. 3-4 times daily     gabapentin (NEURONTIN) 300 MG capsule Take 300 mg by mouth 5 (five) times daily.     ketorolac (ACULAR) 0.5 % ophthalmic solution Place 1 drop into both eyes 4 (four) times daily.     levothyroxine (SYNTHROID, LEVOTHROID) 125 MCG tablet Take 125 mcg by mouth daily before breakfast.     metoprolol succinate (TOPROL-XL) 50 MG 24 hr tablet TAKE 1 TABLET EVERY DAY WITH OR IMMEDIATELY FOLLOWING A MEAL. DISCONTINUE VERAPAMIL 90 tablet 3   mexiletine (MEXITIL) 150 MG capsule Take 2 capsules (300 mg total) by mouth 2 (two) times daily. 360 capsule 2   Multiple Vitamins-Minerals (PRESERVISION AREDS 2 PO) Take 1 capsule by mouth 2 (two) times daily.     pantoprazole (PROTONIX) 40 MG tablet Take 1 tablet (40 mg total) by mouth daily. 90 tablet 3   prednisoLONE acetate (PRED FORTE) 1 % ophthalmic suspension Place 1 drop into the left eye 4 (four) times daily.     traMADol (ULTRAM) 50 MG tablet Take 50 mg by mouth every 6 (six) hours as needed for moderate pain.     acetaminophen (TYLENOL) 500 MG tablet Take 500 mg by mouth every 6 (six) hours as needed.     baclofen (LIORESAL) 10 MG tablet Take 10 mg by mouth as needed.     hydrocortisone 2.5 % cream Apply 1 Application topically as needed (for ankle).     montelukast (SINGULAIR) 10 MG tablet Take 10 mg by mouth daily.     nitroGLYCERIN (NITROSTAT) 0.4 MG SL tablet Place 1 tablet (0.4 mg total) under the tongue every 5 (five) minutes as needed for chest pain (x 3 doses). 25 tablet 0   sildenafil (REVATIO) 20 MG tablet Take 1-5 tablets by mouth daily as needed.     Current  Facility-Administered Medications  Medication Dose Route Frequency Provider Last Rate Last Admin   0.9 %  sodium chloride infusion  500 mL Intravenous Continuous Charlie Pitter III, MD          ___________________________________________________________________ Objective   Exam:  BP (!) 143/83   Pulse 66   Temp 97.7 F (36.5 C)   Ht 5\' 6"  (1.676 m)   Wt 172 lb (78 kg)   SpO2 98%   BMI 27.76 kg/m   CV: regular ,  S1/S2 Resp: clear to auscultation bilaterally, normal RR and effort noted GI: soft, no tenderness, with active bowel sounds.   Assessment: Encounter Diagnosis  Name Primary?   Personal history of colonic polyps Yes     Plan: Colonoscopy   The benefits and risks of the planned procedure were described in detail with the patient or (when appropriate) their health care proxy.  Risks were outlined as including, but not limited to, bleeding, infection, perforation, adverse medication reaction leading to cardiac or pulmonary decompensation, pancreatitis (if ERCP).  The limitation of incomplete mucosal visualization was also discussed.  No guarantees or warranties were given.  The patient is appropriate for an endoscopic procedure in the ambulatory setting.   - Duane Jupiter, MD

## 2023-05-22 ENCOUNTER — Telehealth: Payer: Self-pay

## 2023-05-22 NOTE — Telephone Encounter (Signed)
No answer, unable to leave a message, B.Furqan Gosselin RN. 

## 2023-05-24 ENCOUNTER — Ambulatory Visit: Payer: Medicare HMO | Admitting: Cardiology

## 2023-06-05 ENCOUNTER — Ambulatory Visit (INDEPENDENT_AMBULATORY_CARE_PROVIDER_SITE_OTHER): Payer: Medicare HMO

## 2023-06-05 DIAGNOSIS — G459 Transient cerebral ischemic attack, unspecified: Secondary | ICD-10-CM | POA: Diagnosis not present

## 2023-06-05 LAB — CUP PACEART REMOTE DEVICE CHECK
Date Time Interrogation Session: 20241006180310
Implantable Pulse Generator Implant Date: 20240903

## 2023-06-19 NOTE — Progress Notes (Signed)
Carelink Summary Report / Loop Recorder 

## 2023-06-23 ENCOUNTER — Telehealth: Payer: Self-pay | Admitting: Cardiology

## 2023-06-23 NOTE — Telephone Encounter (Signed)
Pt's wife wants to make sure it is a go on the sleep study. Please advise

## 2023-06-23 NOTE — Telephone Encounter (Signed)
Ordering provider: DR Elberta Fortis Associated diagnoses: R06.83--R40.0 WatchPAT PA obtained on 06/23/2023 by Latrelle Dodrill, CMA. Authorization: No; tracking ID NO PA REQ FOR HST Patient notified of PIN ([id number]) on 06/23/2023 via Notification Method: phone.

## 2023-06-30 ENCOUNTER — Encounter (HOSPITAL_COMMUNITY): Payer: Self-pay

## 2023-06-30 ENCOUNTER — Emergency Department (HOSPITAL_COMMUNITY)
Admission: EM | Admit: 2023-06-30 | Discharge: 2023-06-30 | Disposition: A | Discharge status: Home / Self Care | Payer: Medicare HMO | Attending: Emergency Medicine | Admitting: Emergency Medicine

## 2023-06-30 ENCOUNTER — Other Ambulatory Visit: Payer: Self-pay

## 2023-06-30 ENCOUNTER — Emergency Department (HOSPITAL_COMMUNITY): Payer: Medicare HMO

## 2023-06-30 DIAGNOSIS — Z7982 Long term (current) use of aspirin: Secondary | ICD-10-CM | POA: Diagnosis not present

## 2023-06-30 DIAGNOSIS — R450 Nervousness: Secondary | ICD-10-CM | POA: Insufficient documentation

## 2023-06-30 DIAGNOSIS — R5383 Other fatigue: Secondary | ICD-10-CM | POA: Insufficient documentation

## 2023-06-30 DIAGNOSIS — R479 Unspecified speech disturbances: Secondary | ICD-10-CM | POA: Insufficient documentation

## 2023-06-30 DIAGNOSIS — R471 Dysarthria and anarthria: Secondary | ICD-10-CM

## 2023-06-30 DIAGNOSIS — R531 Weakness: Secondary | ICD-10-CM

## 2023-06-30 LAB — COMPREHENSIVE METABOLIC PANEL
ALT: 37 U/L (ref 0–44)
AST: 32 U/L (ref 15–41)
Albumin: 3.9 g/dL (ref 3.5–5.0)
Alkaline Phosphatase: 60 U/L (ref 38–126)
Anion gap: 7 (ref 5–15)
BUN: 17 mg/dL (ref 8–23)
CO2: 25 mmol/L (ref 22–32)
Calcium: 9.1 mg/dL (ref 8.9–10.3)
Chloride: 107 mmol/L (ref 98–111)
Creatinine, Ser: 1.01 mg/dL (ref 0.61–1.24)
GFR, Estimated: >60 mL/min (ref >60)
Glucose, Bld: 80 mg/dL (ref 70–99)
Potassium: 3.9 mmol/L (ref 3.5–5.1)
Sodium: 139 mmol/L (ref 135–145)
Total Bilirubin: 0.9 mg/dL (ref 0.3–1.2)
Total Protein: 7 g/dL (ref 6.5–8.1)

## 2023-06-30 LAB — DIFFERENTIAL
Abs Immature Granulocytes: 0.04 10*3/uL (ref 0.00–0.07)
Basophils Absolute: 0 10*3/uL (ref 0.0–0.1)
Basophils Relative: 1 %
Eosinophils Absolute: 0.1 10*3/uL (ref 0.0–0.5)
Eosinophils Relative: 2 %
Immature Granulocytes: 1 %
Lymphocytes Relative: 13 %
Lymphs Abs: 1 10*3/uL (ref 0.7–4.0)
Monocytes Absolute: 1.1 10*3/uL — ABNORMAL HIGH (ref 0.1–1.0)
Monocytes Relative: 15 %
Neutro Abs: 5.3 10*3/uL (ref 1.7–7.7)
Neutrophils Relative %: 68 %

## 2023-06-30 LAB — CBC
HCT: 45.9 % (ref 39.0–52.0)
Hemoglobin: 15 g/dL (ref 13.0–17.0)
MCH: 31.5 pg (ref 26.0–34.0)
MCHC: 32.7 g/dL (ref 30.0–36.0)
MCV: 96.4 fL (ref 80.0–100.0)
Platelets: 173 10*3/uL (ref 150–400)
RBC: 4.76 MIL/uL (ref 4.22–5.81)
RDW: 13.8 % (ref 11.5–15.5)
WBC: 7.7 10*3/uL (ref 4.0–10.5)
nRBC: 0 % (ref 0.0–0.2)

## 2023-06-30 NOTE — ED Notes (Signed)
Pt in NAD at d/c from ED. A&O. Ambulatory with assistance. Respirations even & unlabored. Skin warm & dry. Pt states he feels back to baseline. Pt & family verbalized understanding of d/c teaching including follow up care, medications and reasons to return to the ED. No needs or questions expressed at dc

## 2023-06-30 NOTE — ED Triage Notes (Signed)
Pt present to ED with c/o stroke like symptoms. Pt states having difficulty speaking, jitteriness,  being thirsty, unsteady gait for 2-3 days, states same symptoms became worse approximately at 0800. Pt A&Ox3 at this time. Jittery noted when having cup in hand. Pt states hx of stroke, denies hx of diabetes.

## 2023-06-30 NOTE — Discharge Instructions (Addendum)
1.  Call your neurologist, Dr. Pearlean Brownie to schedule a follow-up appointment as soon as possible. 2.  At this time your MRI is not showing any new stroke areas.  Continue your daily aspirin and your atorvastatin. 3.  There may have been other contributing factors for the symptoms you experienced, follow-up with your family doctor for recheck and further evaluation if needed.  Sometimes an episode of low blood pressure can mimic or make old stroke areas have temporary symptoms.  Monitor your blood pressures at home, make a log entry 2-3 times a day during a calm situation.  Some medications can also create imbalance, weakness and visual changes.  Especially medications like muscle relaxers such as baclofen or pain medications such as Neurontin or tramadol.  Pay Close attention to the timing of your medications and see if symptoms seem to relate to your medications. 4.  Return to the emergency department if you have suddenly new worsening or changing symptoms.  Review the discharge instructions regarding stroke.

## 2023-06-30 NOTE — ED Provider Notes (Signed)
[place] EMERGENCY DEPARTMENT AT [facility] Provider Note   CSN: [id number] Arrival date & time: 06/30/23  1056     History  Chief Complaint  Patient presents with   Aphasia    Duane Reyes is a 72 y.o. male.  HPI   Pt states he started having trouble with his speech and difficulty holding his utensils.  Pt was also feeling jittery and fatigued.  These symptoms started yesterday.  He felt better when he iniitally woke up but then the symptoms started while eating.  He went to the eye doctors appointment today and it was still occurring.    Pt also feels like his balance is off and left leg is weaker.  Home Medications Prior to Admission medications   Medication Sig Start Date End Date Taking? Authorizing Provider  acetaminophen (TYLENOL) 500 MG tablet Take 500 mg by mouth every 6 (six) hours as needed.   Yes [provider]  albuterol (PROAIR HFA) 108 (90 Base) MCG/ACT inhaler Inhale 2 puffs into the lungs every 6 (six) hours as needed (for wheezing or shortness of breath). 10/07/21  Yes Leslye Peer, MD  aspirin EC 81 MG tablet Take 1 tablet (81 mg total) by mouth daily. Swallow whole. 02/15/23  Yes Danford, Earl Lites, MD  atorvastatin (LIPITOR) 80 MG tablet Take 1 tablet (80 mg total) by mouth daily. Patient taking differently: Take 40 mg by mouth every evening. 12/27/22  Yes Turner, Cornelious Bryant, MD  baclofen (LIORESAL) 10 MG tablet Take 10 mg by mouth as needed. 11/22/21  Yes [provider]  budesonide-formoterol (SYMBICORT) 160-4.5 MCG/ACT inhaler Inhale 2 puffs into the lungs 2 (two) times daily as needed (flares).   Yes [provider]  Cholecalciferol (VITAMIN D-3 PO) Take 1 capsule by mouth daily.   Yes [provider]  Coenzyme Q10 (CO Q-10) 120 MG CAPS Take 120 mg by mouth daily.   Yes [provider]  diclofenac Sodium (VOLTAREN) 1 % GEL Apply 2 g topically as needed (pain). 3-4 times daily 10/11/21  Yes [provider]  gabapentin (NEURONTIN) 300 MG capsule Take 300 mg by mouth 5 (five) times daily. 10/10/22  Yes [provider]  ketorolac (ACULAR) 0.5 % ophthalmic solution Place 1 drop into both eyes in the morning and at bedtime. 01/16/23  Yes [provider]  levothyroxine (SYNTHROID, LEVOTHROID) 125 MCG tablet Take 125 mcg by mouth daily before breakfast.   Yes [provider]  metoprolol succinate (TOPROL-XL) 50 MG 24 hr tablet TAKE 1 TABLET EVERY DAY WITH OR IMMEDIATELY FOLLOWING A MEAL. DISCONTINUE VERAPAMIL 05/08/23  Yes Camnitz, Andree Coss, MD  mexiletine (MEXITIL) 150 MG capsule Take 2 capsules (300 mg total) by mouth 2 (two) times daily. 10/11/22  Yes Camnitz, Will Daphine Deutscher, MD  Multiple Vitamins-Minerals (PRESERVISION AREDS 2 PO) Take 1 capsule by mouth 2 (two) times daily.   Yes [provider]  nitroGLYCERIN (NITROSTAT) 0.4 MG SL tablet Place 1 tablet (0.4 mg total) under the tongue every 5 (five) minutes as needed for chest pain (x 3 doses). 02/10/22  Yes Camnitz, Will Daphine Deutscher, MD  pantoprazole (PROTONIX) 40 MG tablet Take 1 tablet (40 mg total) by mouth daily. 08/21/19  Yes Camnitz, Will Daphine Deutscher, MD  prednisoLONE acetate (PRED FORTE) 1 % ophthalmic suspension Place 1 drop into both eyes in the morning and at bedtime.   Yes [provider]  sildenafil (REVATIO) 20 MG tablet Take 1-5 tablets by mouth daily as  needed. 03/04/22  Yes [provider]  traMADol (ULTRAM) 50 MG tablet Take 50 mg by mouth every 6 (six) hours as needed for moderate pain. 01/10/23  Yes [provider]  triamcinolone ointment (KENALOG) 0.5 % Apply 1 Application topically 2 (two) times daily. Mix with CeraVe cream 1:1. 05/23/23  Yes [provider]      Allergies    Topiramate, Celecoxib, Pitavastatin, Rosuvastatin calcium, and Hydrocodone-acetaminophen    Review of Systems   Review of Systems  Physical Exam Updated Vital Signs BP (!) 164/90   Pulse  67   Temp 97.9 F (36.6 C) (Oral)   Resp 16   Ht 1.676 m (5\' 6" )   Wt 78 kg   SpO2 99%   BMI 27.75 kg/m  Physical Exam Vitals and nursing note reviewed.  Constitutional:      General: He is not in acute distress.    Appearance: He is well-developed.  HENT:     Head: Normocephalic and atraumatic.     Right Ear: External ear normal.     Left Ear: External ear normal.  Eyes:     General: No visual field deficit or scleral icterus.       Right eye: No discharge.        Left eye: No discharge.     Conjunctiva/sclera: Conjunctivae normal.  Neck:     Trachea: No tracheal deviation.  Cardiovascular:     Rate and Rhythm: Normal rate and regular rhythm.  Pulmonary:     Effort: Pulmonary effort is normal. No respiratory distress.     Breath sounds: Normal breath sounds. No stridor. No wheezing or rales.  Abdominal:     General: Bowel sounds are normal. There is no distension.     Palpations: Abdomen is soft.     Tenderness: There is no abdominal tenderness. There is no guarding or rebound.  Musculoskeletal:        General: No tenderness.     Cervical back: Neck supple.  Skin:    General: Skin is warm and dry.     Findings: No rash.  Neurological:     Mental Status: He is alert and oriented to person, place, and time.     Cranial Nerves: No cranial nerve deficit, dysarthria or facial asymmetry.     Sensory: No sensory deficit.     Motor: No abnormal muscle tone, seizure activity or pronator drift.     Coordination: Coordination normal.     Comments:  able to hold both legs off bed for 5 seconds, sensation intact in all extremities,  no left or right sided neglect, normal finger-nose exam bilaterally, no nystagmus noted   Psychiatric:        Mood and Affect: Mood normal.     ED Results / Procedures / Treatments   Labs (all labs ordered are listed, but only abnormal results are displayed) Labs Reviewed  DIFFERENTIAL - Abnormal; Notable for the following components:       Result Value   Monocytes Absolute 1.1 (*)    All other components within normal limits  CBC  COMPREHENSIVE METABOLIC PANEL    EKG EKG Interpretation Date/Time:  Friday June 30 2023 11:11:56 EDT Ventricular Rate:  68 PR Interval:  208 QRS Duration:  99 QT Interval:  393 QTC Calculation: 418 R Axis:   -34  Text Interpretation: Sinus rhythm Consider RVH w/ secondary repol abnormality Inferior infarct, old No significant change since last tracing Confirmed by Linwood Dibbles [phone]) on  06/30/2023 11:13:19 AM  Radiology CT HEAD WO CONTRAST  Result Date: 06/30/2023 CLINICAL DATA:  Neuro deficit, acute, stroke suspected EXAM: CT HEAD WITHOUT CONTRAST TECHNIQUE: Contiguous axial images were obtained from the base of the skull through the vertex without intravenous contrast. RADIATION DOSE REDUCTION: This exam was performed according to the departmental dose-optimization program which includes automated exposure control, adjustment of the mA and/or kV according to patient size and/or use of iterative reconstruction technique. COMPARISON:  CT Head 02/13/23 FINDINGS: Brain: No hemorrhage. No hydrocephalus. No extra-axial fluid collection. No CT evidence of an acute cortical infarct. No mass effect. No mass lesion. Chronic left thalamic infarct Vascular: No hyperdense vessel or unexpected calcification. Skull: Normal. Negative for fracture or focal lesion. Sinuses/Orbits: No middle ear or mastoid effusion. Frothy secretions in the left maxillary sinus, which can be seen in the setting of acute sinusitis. Left lens replacement. Orbits are otherwise unremarkable. Other: None. IMPRESSION: 1. No hemorrhage or CT evidence of an acute cortical infarct. 2. Frothy secretions in the left maxillary sinus, which can be seen in the setting of acute sinusitis. Electronically Signed   By: Lorenza Cambridge M.D.   On: 06/30/2023 14:26    Procedures Procedures    Medications Ordered in ED Medications - No data to  display  ED Course/ Medical Decision Making/ A&P Clinical Course as of 06/30/23 1608  Fri Jun 30, 2023  1440 Head CT without acute findings [JK]  1529 CBC metabolic panel without acute abnormalities. [JK]  1529 Patient states he has a loop recorder not a pacemaker.  Will order MRI.  Neurology consult placed [JK]    Clinical Course User Index [JK] Linwood Dibbles, MD                                 Medical Decision Making Amount and/or Complexity of Data Reviewed Labs: ordered. Radiology: ordered.   Patient presents to ED for evaluation of difficulty speaking trouble with his gait over the last couple days.  Concerning for the possibility of TIA stroke.  Patient does have prior history of stroke.  On exam no focal deficits noted.  Laboratory tests without significant abnormalities.  Initial head CT shows old stroke but no acute findings.  However with his history we will plan on MRI for further evaluation. Discussed with Dr Iver Nestle.  Reviewed his prior evaluation.  Does not feel full stroke up necessary again.  Agrees with MRI.  If reassuring OK for outpt follow up with Dr Pearlean Brownie       Final Clinical Impression(s) / ED Diagnoses Final diagnoses:  None    Rx / DC Orders ED Discharge Orders     None         Linwood Dibbles, MD 06/30/23 [phone]

## 2023-06-30 NOTE — ED Provider Notes (Signed)
CVA evaluation. Waxing waning sx. MRI pending. Follow neuro recs. Physical Exam  BP (!) 164/90   Pulse 67   Temp 97.9 F (36.6 C) (Oral)   Resp 16   Ht 5\' 6"  (1.676 m)   Wt 78 kg   SpO2 99%   BMI 27.75 kg/m   Physical Exam  Procedures  Procedures  ED Course / MDM   Clinical Course as of 06/30/23 1557  Fri Jun 30, 2023  1440 Head CT without acute findings [JK]  1529 CBC metabolic panel without acute abnormalities. [JK]  1529 Patient states he has a loop recorder not a pacemaker.  Will order MRI.  Neurology consult placed [JK]    Clinical Course User Index [JK] Linwood Dibbles, MD   Medical Decision Making Amount and/or Complexity of Data Reviewed Labs: ordered. Radiology: ordered.   MRI does not show any acute stroke area.  Neurology's recommendation was for outpatient follow-up if no acute stroke findings.  Patient is alert.  Mental status clear.  Speech is clear.  I have reviewed MRI findings and plan for follow-up and home management.  I also suggested patient might consider tapering off and may be discontinuing Neurontin if he does not need it for pain control.  He is not taking baclofen or tramadol currently.  We discussed the possibility of medications causing symptoms and we discussed the possibility of isolated episodes of low blood pressure with some hypoperfusion in areas of prior CVA injury.  Patient will monitor his blood pressures at home, stay hydrated and schedule close follow-up with Dr. Mikey Bussing, MD 06/30/23 [phone]

## 2023-07-01 ENCOUNTER — Emergency Department (HOSPITAL_BASED_OUTPATIENT_CLINIC_OR_DEPARTMENT_OTHER): Payer: Medicare HMO | Admitting: Radiology

## 2023-07-01 ENCOUNTER — Emergency Department (HOSPITAL_BASED_OUTPATIENT_CLINIC_OR_DEPARTMENT_OTHER)
Admission: EM | Admit: 2023-07-01 | Discharge: 2023-07-01 | Disposition: A | Discharge status: Home / Self Care | Payer: Medicare HMO | Attending: Emergency Medicine | Admitting: Emergency Medicine

## 2023-07-01 ENCOUNTER — Encounter (HOSPITAL_BASED_OUTPATIENT_CLINIC_OR_DEPARTMENT_OTHER): Payer: Self-pay | Admitting: Emergency Medicine

## 2023-07-01 DIAGNOSIS — I10 Essential (primary) hypertension: Secondary | ICD-10-CM | POA: Diagnosis not present

## 2023-07-01 DIAGNOSIS — S6991XA Unspecified injury of right wrist, hand and finger(s), initial encounter: Secondary | ICD-10-CM | POA: Diagnosis present

## 2023-07-01 DIAGNOSIS — S63501A Unspecified sprain of right wrist, initial encounter: Secondary | ICD-10-CM | POA: Diagnosis not present

## 2023-07-01 DIAGNOSIS — Z7982 Long term (current) use of aspirin: Secondary | ICD-10-CM | POA: Diagnosis not present

## 2023-07-01 DIAGNOSIS — Z79899 Other long term (current) drug therapy: Secondary | ICD-10-CM | POA: Insufficient documentation

## 2023-07-01 DIAGNOSIS — X500XXA Overexertion from strenuous movement or load, initial encounter: Secondary | ICD-10-CM | POA: Diagnosis not present

## 2023-07-01 DIAGNOSIS — I2510 Atherosclerotic heart disease of native coronary artery without angina pectoris: Secondary | ICD-10-CM | POA: Diagnosis not present

## 2023-07-01 NOTE — Discharge Instructions (Signed)
Please follow-up with your primary care provider regards recent and ER visit.  Today your x-ray is negative for any fractures and most likely have a wrist sprain.  We placed a wrist brace on your wrist to help however you may take Tylenol every 6 hours as needed for pain.  Please avoid ibuprofen at this time.  Please ice your wrist and if symptoms change or worsen please return to ER.

## 2023-07-01 NOTE — ED Provider Notes (Signed)
[place] EMERGENCY DEPARTMENT AT [facility] Provider Note   CSN: [id number] Arrival date & time: 07/01/23  1043     History  Chief Complaint  Patient presents with   Wrist Pain    Duane Reyes is a 72 y.o. male history of CAD, hypertension, hyperlipidemia, TIA presented with right wrist pain that began last night after being discharged in the hospital.  Patient was seen yesterday for a stroke workup that was ultimately negative however states that he injured his wrist moving along the bed last night.  Patient is taken ibuprofen which does help slightly.  Patient states is able to move his wrist and grip without any issues states that the outside portion of his wrist hurts.  Patient denies any other injuries, changes sensation/motor skills, skin color changes.  Home Medications Prior to Admission medications   Medication Sig Start Date End Date Taking? Authorizing Provider  acetaminophen (TYLENOL) 500 MG tablet Take 500 mg by mouth every 6 (six) hours as needed.    [provider]  albuterol (PROAIR HFA) 108 (90 Base) MCG/ACT inhaler Inhale 2 puffs into the lungs every 6 (six) hours as needed (for wheezing or shortness of breath). 10/07/21   Leslye Peer, MD  aspirin EC 81 MG tablet Take 1 tablet (81 mg total) by mouth daily. Swallow whole. 02/15/23   Danford, Earl Lites, MD  atorvastatin (LIPITOR) 80 MG tablet Take 1 tablet (80 mg total) by mouth daily. Patient taking differently: Take 40 mg by mouth every evening. 12/27/22   Quintella Reichert, MD  baclofen (LIORESAL) 10 MG tablet Take 10 mg by mouth as needed. 11/22/21   [provider]  budesonide-formoterol (SYMBICORT) 160-4.5 MCG/ACT inhaler Inhale 2 puffs into the lungs 2 (two) times daily as needed (flares).    [provider]  Cholecalciferol (VITAMIN D-3 PO) Take 1 capsule by mouth daily.    [provider]  Coenzyme Q10 (CO Q-10) 120 MG CAPS Take 120 mg by mouth daily.     [provider]  diclofenac Sodium (VOLTAREN) 1 % GEL Apply 2 g topically as needed (pain). 3-4 times daily 10/11/21   [provider]  gabapentin (NEURONTIN) 300 MG capsule Take 300 mg by mouth 5 (five) times daily. 10/10/22   [provider]  ketorolac (ACULAR) 0.5 % ophthalmic solution Place 1 drop into both eyes in the morning and at bedtime. 01/16/23   [provider]  levothyroxine (SYNTHROID, LEVOTHROID) 125 MCG tablet Take 125 mcg by mouth daily before breakfast.    [provider]  metoprolol succinate (TOPROL-XL) 50 MG 24 hr tablet TAKE 1 TABLET EVERY DAY WITH OR IMMEDIATELY FOLLOWING A MEAL. DISCONTINUE VERAPAMIL 05/08/23   Camnitz, Andree Coss, MD  mexiletine (MEXITIL) 150 MG capsule Take 2 capsules (300 mg total) by mouth 2 (two) times daily. 10/11/22   Camnitz, Andree Coss, MD  Multiple Vitamins-Minerals (PRESERVISION AREDS 2 PO) Take 1 capsule by mouth 2 (two) times daily.    [provider]  nitroGLYCERIN (NITROSTAT) 0.4 MG SL tablet Place 1 tablet (0.4 mg total) under the tongue every 5 (five) minutes as needed for chest pain (x 3 doses). 02/10/22   Camnitz, Will Daphine Deutscher, MD  pantoprazole (PROTONIX) 40 MG tablet Take 1 tablet (40 mg total) by mouth daily. 08/21/19   Camnitz, Andree Coss, MD  prednisoLONE acetate (PRED FORTE) 1 % ophthalmic suspension Place 1 drop into both eyes in the morning and at bedtime.    [provider]  sildenafil (REVATIO) 20 MG tablet Take 1-5 tablets by mouth daily as needed. 03/04/22   [provider]  traMADol (ULTRAM) 50 MG tablet Take 50 mg by mouth every 6 (six) hours as needed for moderate pain. 01/10/23   [provider]  triamcinolone ointment (KENALOG) 0.5 % Apply 1 Application topically 2 (two) times daily. Mix with CeraVe cream 1:1. 05/23/23   [provider]      Allergies    Topiramate, Celecoxib, Pitavastatin, Rosuvastatin calcium, and Hydrocodone-acetaminophen     Review of Systems   Review of Systems  Physical Exam Updated Vital Signs BP 124/81 (BP Location: Left Arm)   Pulse 67   Temp 98.4 F (36.9 C) (Tympanic)   Resp 16   Ht 5\' 6"  (1.676 m)   Wt 80.3 kg   SpO2 97%   BMI 28.57 kg/m  Physical Exam Vitals reviewed.  Constitutional:      General: He is not in acute distress. Cardiovascular:     Rate and Rhythm: Normal rate.     Pulses: Normal pulses.  Musculoskeletal:     Comments: Right wrist: Tender along the TFCC with no abnormalities, 5 out of 5 grip strength, wrist flexion/extension, wrist adduction/abduction, no bony abnormalities noted, no signs of open wounds, no skin color changes, no warmth/fluctuance/discharge Pain not out of proportion Soft compartments  Skin:    General: Skin is warm and dry.     Capillary Refill: Capillary refill takes less than 2 seconds.  Neurological:     Mental Status: He is alert.     Comments: Sensation intact distally  Psychiatric:        Mood and Affect: Mood normal.     ED Results / Procedures / Treatments   Labs (all labs ordered are listed, but only abnormal results are displayed) Labs Reviewed - No data to display  EKG None  Radiology DG Wrist Complete Right  Result Date: 07/01/2023 CLINICAL DATA:  Pain and weakness in right wrist EXAM: RIGHT WRIST - COMPLETE 3+ VIEW COMPARISON:  None Available. FINDINGS: There is no acute fracture or dislocation. Bony alignment is normal. There is mild degenerative change at the thumb and index finger CMC joints and at the [facility] articulation. There is no erosive change. The soft tissues are unremarkable. IMPRESSION: No acute finding. Mild degenerative change at the thumb and index finger CMC joints and at the [facility] articulation. Electronically Signed   By: Lesia Hausen M.D.   On: 07/01/2023 11:41   MR BRAIN WO CONTRAST  Result Date: 06/30/2023 CLINICAL DATA:  Tremors, difficulty speaking, evaluate for stroke. EXAM: MRI HEAD WITHOUT  CONTRAST TECHNIQUE: Multiplanar, multiecho pulse sequences of the brain and surrounding structures were obtained without intravenous contrast. COMPARISON:  Same-day noncontrast CT head FINDINGS: Brain: There is no acute intracranial hemorrhage, extra-axial fluid collection, or acute infarct. Parenchymal volume is within expected limits for age. The ventricles are normal in size. Patchy and confluent FLAIR signal abnormality in the supratentorial white matter likely reflects sequela of moderate underlying chronic small-vessel ischemic change. There are small remote infarcts in the thalami, left larger than right. There is no cortical encephalomalacia. There are a few scattered punctate chronic microhemorrhages, nonspecific in distribution. The pituitary and suprasellar region are normal. There is no mass lesion. There is no mass effect or midline shift. Vascular: Normal flow voids. Skull and upper cervical spine: Normal marrow signal. Sinuses/Orbits: Layering fluid is again seen in the left maxillary sinus. A left lens implant is  noted. The globes and orbits are otherwise unremarkable. 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 IMPRESSION: 1. No acute intracranial pathology. 2. 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 3. 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 Consider ENT referral for workup. Electronically Signed   By: Lesia Hausen M.D.   On: 06/30/2023 18:10   CT HEAD WO CONTRAST  Result Date: 06/30/2023 CLINICAL DATA:  Neuro deficit, acute, stroke suspected EXAM: CT HEAD WITHOUT CONTRAST TECHNIQUE: Contiguous axial images were obtained from the base of the skull through the vertex without intravenous contrast. RADIATION DOSE REDUCTION: This exam was performed according to the departmental dose-optimization program which includes automated exposure control, adjustment of the mA and/or kV according to patient size and/or use  of iterative reconstruction technique. COMPARISON:  CT Head 02/13/23 FINDINGS: Brain: No hemorrhage. No hydrocephalus. No extra-axial fluid collection. No CT evidence of an acute cortical infarct. No mass effect. No mass lesion. Chronic left thalamic infarct Vascular: No hyperdense vessel or unexpected calcification. Skull: Normal. Negative for fracture or focal lesion. Sinuses/Orbits: No middle ear or mastoid effusion. Frothy secretions in the left maxillary sinus, which can be seen in the setting of acute sinusitis. Left lens replacement. Orbits are otherwise unremarkable. Other: None. IMPRESSION: 1. No hemorrhage or CT evidence of an acute cortical infarct. 2. Frothy secretions in the left maxillary sinus, which can be seen in the setting of acute sinusitis. Electronically Signed   By: Lorenza Cambridge M.D.   On: 06/30/2023 14:26    Procedures Procedures    Medications Ordered in ED Medications - No data to display  ED Course/ Medical Decision Making/ A&P                                 Medical Decision Making Amount and/or Complexity of Data Reviewed Radiology: ordered.   AYDIN HINK 72 y.o. presented today for right wrist pain. Working DDx that I considered at this time includes, but not limited to, contusion, strain/sprain, fracture, dislocation, neurovascular compromise, septic joint, ischemic limb, compartment syndrome.  R/o DDx: fracture, dislocation, neurovascular compromise, septic joint, ischemic limb, compartment syndrome, contusion: These are considered less likely due to history of present illness, physical exam, labs/imaging findings.  Review of prior external notes: 06/30/2023 ED  Unique Tests and My Interpretation:  Right wrist x-ray: No acute changes  Social Determinants of Health: none  Discussion with Independent Historian:  Wife  Discussion of Management of Tests: None  Risk: Low: based on diagnostic testing/clinical impression and treatment plan  Risk  Stratification Score: None  Plan: On exam patient was in no acute distress stable vitals.  Patient's exam does show tenderness in the TFCC on the right side where patient indicates he is having his discomfort but otherwise was neurovascularly intact and had reassuring physical exam.  X-rays negative for any fractures and so do suspect patient has wrist pain will place in brace and have him follow-up with his primary care provider.  Encouraged patient use Tylenol every 6 hours needed for pain, ice, elevate and to keep his wrist in the brace.  At this time I have very low suspicion of any life-threatening diagnoses.  Patient was given return precautions. Patient stable for discharge at this time.  Patient verbalized understanding of plan.  This chart was dictated using voice recognition software.  Despite best efforts to proofread,  errors  can occur which can change the documentation meaning.         Final Clinical Impression(s) / ED Diagnoses Final diagnoses:  Sprain of right wrist, initial encounter    Rx / DC Orders ED Discharge Orders     None         Remi Deter 07/01/23 1242    Ernie Avena, MD 07/01/23 1500

## 2023-07-01 NOTE — ED Notes (Signed)
Splint placed by NT. Discharge instructions and follow up care reviewed and explained, pt verbalized understanding and had no further questions on d/c. Pt caox4, ambulatory, NAD on d/c.

## 2023-07-01 NOTE — ED Triage Notes (Signed)
Pt caox4, ambulatory, NAD c/o pain and weakness in R wrist that started last night after getting home from being at the hospital all day. Worsens when trying to use R hand. Pt further reports he was worked up for CVA yesterday, which was neg, but thinks "wrist pain is from pushing himself up in the bed all day." Last took motrin approx 30 min ago.

## 2023-07-06 ENCOUNTER — Telehealth: Payer: Self-pay | Admitting: Neurology

## 2023-07-06 NOTE — Telephone Encounter (Signed)
Returned pt's call regarding his recent ER visit. Pt stated that on Nov/1/24, he went out to a restaurant to eat and out of nowhere he started feeling jittery to the point where he couldn't even hold his eating utensils. Pt stated he wanted to make sure that nothing serious was happening so he went to the ER where he had a complete stroke work up, MRI, CT and blood work. Everything came back negative. Pt states that his symptoms went away after a few hours. States he was told by the ER physician to schedule a follow up with his PCP and Neurologist as well. Pt states that since his ER visit he has not had anymore episodes like this. States he feels fine and just wanted to get on the schedule to see Dr Pearlean Brownie for a follow up. States he does have an appt scheduled with his PCP for 08/14/23. Advised pt that we will add him to our wait list for an appointment with Dr Pearlean Brownie. Pt was agreeable to this and thanked me for calling.

## 2023-07-06 NOTE — Telephone Encounter (Signed)
Pt said after left eye appt went to  restaurant for breakfast. Got difficult to hold fork to cut the omelet. Left restaurant and went to the ED on 06/30/23. They found no indication of a having a stroke. By the afternoon was everything got back to normal. ED physical recommended see your neurologist or PCP. Have an appt with PCP 12/16 at 9 am. If you could fine something earlier that would be great.

## 2023-07-10 ENCOUNTER — Ambulatory Visit: Payer: Medicare HMO

## 2023-07-10 ENCOUNTER — Ambulatory Visit: Payer: Medicare HMO | Admitting: Neurology

## 2023-07-10 ENCOUNTER — Encounter: Payer: Self-pay | Admitting: Neurology

## 2023-07-10 VITALS — BP 111/70 | HR 70 | Ht 67.0 in | Wt 172.0 lb

## 2023-07-10 DIAGNOSIS — G459 Transient cerebral ischemic attack, unspecified: Secondary | ICD-10-CM | POA: Diagnosis not present

## 2023-07-10 DIAGNOSIS — R251 Tremor, unspecified: Secondary | ICD-10-CM

## 2023-07-10 DIAGNOSIS — R4789 Other speech disturbances: Secondary | ICD-10-CM

## 2023-07-10 NOTE — Progress Notes (Signed)
[facility] [address] [place]. [place] [address] [phone]       OFFICE FOLLOW-UP NOTE  [name] Date of Birth:  [birth year] Medical Record Number:  [id number]   HPI: Initial visit 02/23/2023 [name] is a 72 year old male seen today for office follow-up visit following hospital for TIA.  History is obtained from the patient and review of electronic medical records.  I personally reviewed pertinent available imaging films in PACS.Duane Reyes is a 72 y.o. male with PMH significant for CHF, GERD, HTN, HLD, hypothyroidism, PVCs, GERD, chronic DOE, who presented on 02/13/2023 with transient episode of word finding difficulty.  He got up that morning fine.  He went outside and tried to [place] the lawn.  He was outside for an hour he felt tired and came back and sat down.  He called his girlfriend was having trouble getting his words out.  He had to struggle to get the words out and they were slurred and at times he ended up using wrong words.  He denied headache or other focal neurological symptoms.  He was fully aware of his surroundings .  He was not confused.  Words appeared to be quite slurred .the episode appeared to be over by the time his girlfriend came over in 35 minutes He felt episode lasted about 20 minutes.  He denies any prior history of strokes TIAs seizures or significant neurological problems.  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  CT angiogram of the brain and neck showed no large vessel stenosis or occlusion.  EEG was normal without any seizure activity.  Echocardiogram showed ejection fraction of 45 to 50%.  Left atrial size was normal.  Hemoglobin A1c was 5.3.  LDL cholesterol was 60 mg percent.  Ammonia level and TSH were normal.  Patient states is done well since discharge..No further recurrent TIA or strokelike episodes.[name]  He is tolerating aspirin and Plavix well but does  bruise easily.  He is tolerating Lipitor well without muscle aches and pains.  States his blood pressure is under good control.  He denies any history of atrial fibrillation but does admit to having palpitations and  ectopics in the past.  He has never had cardiac monitoring. Update 07/10/2023 : He returns for follow-up after last visit 4 months ago.  He is referred back to me from the emergency room where he visited on 06/30/2023.  He states he was sitting in the chair while all of a sudden he noticed increased jitteriness and tremulousness of his hands.  He had trouble cutting food and holding utensils because of this.  His wife noticed that he had some trouble finding words for few seconds.  He was all right after that and then few minutes later he had some transient speech difficulty again.  He was seen in the ER where he was not felt to be have a TIA.  CT head was done which showed no acute abnormality.  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  Complete metabolic panel labs and were normal.  Patient was asked to see me.  He states he has had intermittent tremors for some time.  His tremors have possibly felt related to gabapentin which she has been taking 305 times daily.  He was asked to taper it and he has done so  and is currently only on 1 tablet.  Is not noticed any significant worsening of his back pain and his hand tremors appear to have improved.  He remains on aspirin which he is tolerating well with minor bruising and no bleeding.  Patient had no recurrent TIA or stroke symptoms.  He is tolerating Lipitor well without muscle aches and pains. ROS:   14 system review of systems is positive for speech difficulty, palpitations, word hesitancy, all other systems negative  PMH:  Past Medical History:  Diagnosis Date   Arthritis    "left knee" (01/29/2016)   Bradycardia, drug induced 10/26/2016    Bronchiolitis    CAD in native artery    a. Abnl cardiac CT ; b. LHC 01/2016 s/p 50% mLAD, 25% OM2, 95% RPDA which was treated with overlapping DES; c. MV 03/2021 negative for ischemia   Chronic combined systolic and diastolic heart failure (HCC)    HFmrEF, echo 03/2021: EF 45-50%, GIDD.   Chronic knee pain    Chronic lower back pain    DOE (dyspnea on exertion)    Dyspnea    Frequent PVCs    a. 12/2015 - Holter showed SB, NSR, ST, HR 54-108, with frequent PVCs in singles, couplets, and bigemy, with elevated PVC load 24%.   GERD (gastroesophageal reflux disease)    History of shingles    Hyperlipidemia    Hypertension    Hypothyroidism    Prostate cancer (HCC)    Scoliosis    Spinal stenosis    Spondylolisthesis of lumbar region    TIA (transient ischemic attack) 01/2023   the Monday after Father's Day in 2024- taken off of PLAVIX on 03/06/2023- added 81 mg ASA daily   Tortuosity of aortic arch    Tortuosity of aortic arch, -would not pursue Rt radial approach in this patient for future cardiac caths     Social History:  Social History   Socioeconomic History   Marital status: Married    Spouse name: Not on file   Number of children: Not on file   Years of education: Not on file   Highest education level: Not on file  Occupational History   Not on file  Tobacco Use   Smoking status: Never   Smokeless tobacco: Never  Vaping Use   Vaping status: Never Used  Substance and Sexual Activity   Alcohol use: Not Currently    Comment: 01/29/2016 "glass of wine q couple months"   Drug use: No   Sexual activity: Not Currently  Other Topics Concern   Not on file  Social History Narrative   Not on file   Social Determinants of Health   Financial Resource Strain: Low Risk  (05/23/2023)   Received from [facility]   Overall Financial Resource Strain (CARDIA)    Difficulty of Paying Living Expenses: Not hard at all  Food Insecurity: No Food Insecurity (05/23/2023)   Received from  [facility]   Hunger Vital Sign    Worried About Running Out of Food in the Last Year: Never true    Ran Out of Food in the Last Year: Never true  Transportation Needs: No Transportation Needs (05/23/2023)   Received from [facility] - Transportation    Lack of Transportation (Medical): No    Lack of Transportation (Non-Medical): No  Physical Activity: Insufficiently Active (05/23/2023)   Received from [facility]   Exercise Vital Sign    Days of Exercise per Week: 3 days  Minutes of Exercise per Session: 30 min  Stress: No Stress Concern Present (05/23/2023)   Received from [facility] - Occupational Stress Questionnaire    Feeling of Stress : Not at all  Social Connections: Socially Integrated (05/23/2023)   Received from [facility]   Social Network    How would you rate your social network (family, work, friends)?: Good participation with social networks  Intimate Partner Violence: Not At Risk (05/23/2023)   Received from [facility]   HITS    Over the last 12 months how often did your partner physically hurt you?: Never    Over the last 12 months how often did your partner insult you or talk down to you?: Never    Over the last 12 months how often did your partner threaten you with physical harm?: Never    Over the last 12 months how often did your partner scream or curse at you?: Never    Medications:   Current Outpatient Medications on File Prior to Visit  Medication Sig Dispense Refill   acetaminophen (TYLENOL) 500 MG tablet Take 500 mg by mouth every 6 (six) hours as needed.     albuterol (PROAIR HFA) 108 (90 Base) MCG/ACT inhaler Inhale 2 puffs into the lungs every 6 (six) hours as needed (for wheezing or shortness of breath). 18 g 6   aspirin EC 81 MG tablet Take 1 tablet (81 mg total) by mouth daily. Swallow whole. 30 tablet 12   atorvastatin (LIPITOR) 80 MG tablet Take 1 tablet (80 mg total) by mouth  daily. (Patient taking differently: Take 40 mg by mouth every evening.) 90 tablet 3   budesonide-formoterol (SYMBICORT) 160-4.5 MCG/ACT inhaler Inhale 2 puffs into the lungs 2 (two) times daily as needed (flares).     Cholecalciferol (VITAMIN D-3 PO) Take 1 capsule by mouth daily.     Coenzyme Q10 (CO Q-10) 120 MG CAPS Take 120 mg by mouth daily.     diclofenac Sodium (VOLTAREN) 1 % GEL Apply 2 g topically as needed (pain). 3-4 times daily     gabapentin (NEURONTIN) 300 MG capsule Take 300 mg by mouth 5 (five) times daily.     ketorolac (ACULAR) 0.5 % ophthalmic solution Place 1 drop into both eyes in the morning and at bedtime.     levothyroxine (SYNTHROID, LEVOTHROID) 125 MCG tablet Take 125 mcg by mouth daily before breakfast.     metoprolol succinate (TOPROL-XL) 50 MG 24 hr tablet TAKE 1 TABLET EVERY DAY WITH OR IMMEDIATELY FOLLOWING A MEAL. DISCONTINUE VERAPAMIL 90 tablet 3   mexiletine (MEXITIL) 150 MG capsule Take 2 capsules (300 mg total) by mouth 2 (two) times daily. 360 capsule 2   Multiple Vitamins-Minerals (PRESERVISION AREDS 2 PO) Take 1 capsule by mouth 2 (two) times daily.     nitroGLYCERIN (NITROSTAT) 0.4 MG SL tablet Place 1 tablet (0.4 mg total) under the tongue every 5 (five) minutes as needed for chest pain (x 3 doses). 25 tablet 0   pantoprazole (PROTONIX) 40 MG tablet Take 1 tablet (40 mg total) by mouth daily. 90 tablet 3   prednisoLONE acetate (PRED FORTE) 1 % ophthalmic suspension Place 1 drop into both eyes in the morning and at bedtime.     sildenafil (REVATIO) 20 MG tablet Take 1-5 tablets by mouth daily as needed.     traMADol (ULTRAM) 50 MG tablet Take 50 mg by mouth every 6 (six) hours as needed for moderate pain.  triamcinolone ointment (KENALOG) 0.5 % Apply 1 Application topically 2 (two) times daily. Mix with CeraVe cream 1:1.     baclofen (LIORESAL) 10 MG tablet Take 10 mg by mouth as needed. (Patient not taking: Reported on 07/10/2023)     No current  facility-administered medications on file prior to visit.    Allergies:   Allergies  Allergen Reactions   Topiramate Anxiety, Palpitations and Other (See Comments)    blurred vision, tired, ringing in ears, restlessness, confusion, rapid heart rate, made him jittery   Celecoxib Other (See Comments)    GI Upset; did not mix with his Nexium/Caused acid relux     Pitavastatin Rash and Cough    LIVALO    Rosuvastatin Calcium Other (See Comments)    Muscle aches    Hydrocodone-Acetaminophen Other (See Comments)    Delusions     Physical Exam General: well developed, well nourished, seated, in no evident distress Head: head normocephalic and atraumatic.  Neck: supple with no carotid or supraclavicular bruits Cardiovascular: regular rate and rhythm, no murmurs Musculoskeletal: no deformity Skin:  no rash/petichiae Vascular:  Normal pulses all extremities Vitals:   07/10/23 1418  BP: 111/70  Pulse: 70   Neurologic Exam Mental Status: Awake and fully alert. Oriented to place and time. Recent and remote memory intact. Attention span, concentration and fund of knowledge appropriate. Mood and affect appropriate.  Speech is clear without aphasia or dysarthria.  Good naming, repetition and comprehension. Cranial Nerves: Fundoscopic exam reveals sharp disc margins. Pupils equal, briskly reactive to light. Extraocular movements full without nystagmus. Visual fields full to confrontation. Hearing intact. Facial sensation intact. Face, tongue, palate moves normally and symmetrically.  Motor: Normal bulk and tone. Normal strength in all tested extremity muscles. Sensory.: intact to touch ,pinprick .position and vibratory sensation.  Coordination: Rapid alternating movements normal in all extremities. Finger-to-nose and heel-to-shin performed accurately bilaterally. Gait and Station: Arises from chair without difficulty. Stance is normal. Gait demonstrates normal stride length and balance . Able  to heel, toe and tandem walk without difficulty.  Reflexes: 1+ and symmetric. Toes downgoing.   NIHSS  0 Modified Rankin  0   ASSESSMENT: 72 year old male with transient episode of expressive aphasia likely left hemispheric TIA June 2024.  Vascular risk factors of coronary artery disease, hypertension, hyperlipidemia.  New onset episode of transient tremors and speech disturbance in November 2024 unlikely to be TIA and likely medication effect of gabapentin.     PLAN:I had a long question the patient and his wife regarding his recent episode of increased tremor with transient speech disturbance which I agree it does not sound like a TIA.  This is likely medication effect and I agree with tapering and discontinuing gabapentin if he does not need it.  I also recommend we check thyroid panel lab today.  Continue aspirin for stroke prevention and maintain aggressive risk factor modification with strict control of hypertension with blood pressure goal below 130/90, lipids with LDL cholesterol goal below 70 mg percent and diabetes with hemoglobin A1c goal below 6.5%.  He will return for follow-up in the future in 6 months with my nurse practitioner or call earlier if necessary. Greater than 50% of time during this 35 minute visit was spent on counseling,explanation of diagnosis, planning of further management, discussion with patient and family and coordination of care Delia Heady, MD Note: This document was prepared with digital dictation and possible smart phrase technology. Any transcriptional errors that result from this process are  unintentional

## 2023-07-10 NOTE — Telephone Encounter (Signed)
Pt has been added to wait list.

## 2023-07-10 NOTE — Telephone Encounter (Signed)
Called pt and was able to offer sooner apt for today with Dr Pearlean Brownie. Pt accepted 2:30 pm check in of 2 p

## 2023-07-10 NOTE — Patient Instructions (Addendum)
I had a long question the patient and his wife regarding his recent episode of increased tremor with transient speech disturbance which I agree it does not sound like a TIA.  This is likely medication effect and I agree with tapering and discontinuing gabapentin if he does not need it.  I also recommend we check thyroid panel lab today.  Continue aspirin for stroke prevention and maintain aggressive risk factor modification with strict control of hypertension with blood pressure goal below 130/90, lipids with LDL cholesterol goal below 70 mg percent and diabetes with hemoglobin A1c goal below 6.5%.  He will return for follow-up in the future on January 6 with Ihor Austin my nurse practitioner or call earlier if necessary.

## 2023-07-11 LAB — CUP PACEART REMOTE DEVICE CHECK
Date Time Interrogation Session: 20241110232509
Implantable Pulse Generator Implant Date: 20240903

## 2023-07-11 LAB — THYROID PANEL WITH TSH
Free Thyroxine Index: 2.7 (ref 1.2–4.9)
T3 Uptake Ratio: 31 % (ref 24–39)
T4, Total: 8.8 ug/dL (ref 4.5–12.0)
TSH: 2.23 u[IU]/mL (ref 0.450–4.500)

## 2023-07-24 NOTE — Progress Notes (Signed)
Kindly inform the patient that thyroid function panel was all satisfactory

## 2023-07-25 ENCOUNTER — Telehealth: Payer: Self-pay | Admitting: *Deleted

## 2023-07-25 NOTE — Telephone Encounter (Signed)
-----   Message from Delia Heady sent at 07/24/2023  5:21 PM EST ----- Kindly inform the patient that thyroid function panel was all satisfactory

## 2023-07-25 NOTE — Telephone Encounter (Signed)
**Note De-Identified Hazley Dezeeuw Obfuscation** Per phone note in the pts chart from 06/23/23, the pt was provided his WatchPAT One-HST device Pin #.  I called the pt and he verified that he was given his Pin # on 06/23/23. I gave it to him again and requested that he proceed with his sleep study and he stated that he will.  He thanked me for my call.

## 2023-07-25 NOTE — Telephone Encounter (Signed)
Spoke to pt gave lab work results. Pt expressed understanding and  thanked me for calling

## 2023-07-31 NOTE — Progress Notes (Unsigned)
[facility] [address] [place]. [place] [address] [phone]       OFFICE FOLLOW-UP NOTE  [name] Date of Birth:  1951-03-03 Medical Record Number:  [id number]    Primary neurologist: Dr. Pearlean Brownie Reason for visit:   HPI:           History provided for reference purposes only Update 07/10/2023 Dr. Pearlean Brownie: He returns for follow-up after last visit 4 months ago.  He is referred back to me from the emergency room where he visited on 06/30/2023.  He states he was sitting in the chair while all of a sudden he noticed increased jitteriness and tremulousness of his hands.  He had trouble cutting food and holding utensils because of this.  His wife noticed that he had some trouble finding words for few seconds.  He was all right after that and then few minutes later he had some transient speech difficulty again.  He was seen in the ER where he was not felt to be have a TIA.  CT head was done which showed no acute abnormality.  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  Complete metabolic panel labs and were normal.  Patient was asked to see me.  He states he has had intermittent tremors for some time.  His tremors have possibly felt related to gabapentin which she has been taking 300mg  5 times daily.  He was asked to taper it and he has done so and is currently only on 1 tablet.  Is not noticed any significant worsening of his back pain and his hand tremors appear to have improved.  He remains on aspirin which he is tolerating well with minor bruising and no bleeding.  Patient had no recurrent TIA or stroke symptoms.  He is tolerating Lipitor well without muscle aches and pains.  Initial visit 02/23/2023 Dr. Pearlean Brownie: Mr. [name] is a 72 year old male seen today for office follow-up visit following hospital for TIA.  History is obtained from the patient and review of  electronic medical records.  I personally reviewed pertinent available imaging films in PACS.Duane Reyes is a 72 y.o. male with PMH significant for CHF, GERD, HTN, HLD, hypothyroidism, PVCs, GERD, chronic DOE, who presented on 02/13/2023 with transient episode of word finding difficulty.  He got up that morning fine.  He went outside and tried to [place] the lawn.  He was outside for an hour he felt tired and came back and sat down.  He called his girlfriend was having trouble getting his words out.  He had to struggle to get the words out and they were slurred and at times he ended up using wrong words.  He denied headache or other focal neurological symptoms.  He was fully aware of his surroundings .  He was not confused.  Words appeared to be quite slurred .the episode appeared to be over by the time his girlfriend came over in 35 minutes He felt episode lasted about 20 minutes.  He denies any prior history of strokes TIAs seizures or significant neurological problems.  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  CT angiogram of the brain and neck showed no large vessel stenosis or occlusion.  EEG was normal without any seizure activity.  Echocardiogram showed ejection fraction of 45 to 50%.  Left atrial size was  normal.  Hemoglobin A1c was 5.3.  LDL cholesterol was 60 mg percent.  Ammonia level and TSH were normal.  Patient states is done well since discharge..No further recurrent TIA or strokelike episodes.[name]  He is tolerating aspirin and Plavix well but does bruise easily.  He is tolerating Lipitor well without muscle aches and pains.  States his blood pressure is under good control.  He denies any history of atrial fibrillation but does admit to having palpitations and  ectopics in the past.  He has never had cardiac monitoring.        ROS:   14 system review of systems is positive for speech difficulty, palpitations, word hesitancy,  all other systems negative  PMH:  Past Medical History:  Diagnosis Date   Arthritis    "left knee" (01/29/2016)   Bradycardia, drug induced 10/26/2016   Bronchiolitis    CAD in native artery    a. Abnl cardiac CT ; b. LHC 01/2016 s/p 50% mLAD, 25% OM2, 95% RPDA which was treated with overlapping DES; c. MV 03/2021 negative for ischemia   Chronic combined systolic and diastolic heart failure (HCC)    HFmrEF, echo 03/2021: EF 45-50%, GIDD.   Chronic knee pain    Chronic lower back pain    DOE (dyspnea on exertion)    Dyspnea    Frequent PVCs    a. 12/2015 - Holter showed SB, NSR, ST, HR 54-108, with frequent PVCs in singles, couplets, and bigemy, with elevated PVC load 24%.   GERD (gastroesophageal reflux disease)    History of shingles    Hyperlipidemia    Hypertension    Hypothyroidism    Prostate cancer (HCC)    Scoliosis    Spinal stenosis    Spondylolisthesis of lumbar region    TIA (transient ischemic attack) 01/2023   the Monday after Father's Day in 2024- taken off of PLAVIX on 03/06/2023- added 81 mg ASA daily   Tortuosity of aortic arch    Tortuosity of aortic arch, -would not pursue Rt radial approach in this patient for future cardiac caths     Social History:  Social History   Socioeconomic History   Marital status: Married    Spouse name: Not on file   Number of children: Not on file   Years of education: Not on file   Highest education level: Not on file  Occupational History   Not on file  Tobacco Use   Smoking status: Never   Smokeless tobacco: Never  Vaping Use   Vaping status: Never Used  Substance and Sexual Activity   Alcohol use: Not Currently    Comment: 01/29/2016 "glass of wine q couple months"   Drug use: No   Sexual activity: Not Currently  Other Topics Concern   Not on file  Social History Narrative   Not on file   Social Determinants of Health   Financial Resource Strain: Low Risk  (05/23/2023)   Received from [facility]   Overall  Financial Resource Strain (CARDIA)    Difficulty of Paying Living Expenses: Not hard at all  Food Insecurity: No Food Insecurity (05/23/2023)   Received from [facility]   Hunger Vital Sign    Worried About Running Out of Food in the Last Year: Never true    Ran Out of Food in the Last Year: Never true  Transportation Needs: No Transportation Needs (05/23/2023)   Received from [facility] - Transportation    Lack of Transportation (  Medical): No    Lack of Transportation (Non-Medical): No  Physical Activity: Insufficiently Active (05/23/2023)   Received from [facility]   Exercise Vital Sign    Days of Exercise per Week: 3 days    Minutes of Exercise per Session: 30 min  Stress: No Stress Concern Present (05/23/2023)   Received from [facility] - Occupational Stress Questionnaire    Feeling of Stress : Not at all  Social Connections: Socially Integrated (05/23/2023)   Received from [facility]   Social Network    How would you rate your social network (family, work, friends)?: Good participation with social networks  Intimate Partner Violence: Not At Risk (05/23/2023)   Received from [facility]   HITS    Over the last 12 months how often did your partner physically hurt you?: Never    Over the last 12 months how often did your partner insult you or talk down to you?: Never    Over the last 12 months how often did your partner threaten you with physical harm?: Never    Over the last 12 months how often did your partner scream or curse at you?: Never    Medications:   Current Outpatient Medications on File Prior to Visit  Medication Sig Dispense Refill   acetaminophen (TYLENOL) 500 MG tablet Take 500 mg by mouth every 6 (six) hours as needed.     albuterol (PROAIR HFA) 108 (90 Base) MCG/ACT inhaler Inhale 2 puffs into the lungs every 6 (six) hours as needed (for wheezing or shortness of breath). 18 g 6   aspirin EC 81 MG  tablet Take 1 tablet (81 mg total) by mouth daily. Swallow whole. 30 tablet 12   atorvastatin (LIPITOR) 80 MG tablet Take 1 tablet (80 mg total) by mouth daily. (Patient taking differently: Take 40 mg by mouth every evening.) 90 tablet 3   baclofen (LIORESAL) 10 MG tablet Take 10 mg by mouth as needed. (Patient not taking: Reported on 07/10/2023)     budesonide-formoterol (SYMBICORT) 160-4.5 MCG/ACT inhaler Inhale 2 puffs into the lungs 2 (two) times daily as needed (flares).     Cholecalciferol (VITAMIN D-3 PO) Take 1 capsule by mouth daily.     Coenzyme Q10 (CO Q-10) 120 MG CAPS Take 120 mg by mouth daily.     diclofenac Sodium (VOLTAREN) 1 % GEL Apply 2 g topically as needed (pain). 3-4 times daily     gabapentin (NEURONTIN) 300 MG capsule Take 300 mg by mouth 5 (five) times daily.     ketorolac (ACULAR) 0.5 % ophthalmic solution Place 1 drop into both eyes in the morning and at bedtime.     levothyroxine (SYNTHROID, LEVOTHROID) 125 MCG tablet Take 125 mcg by mouth daily before breakfast.     metoprolol succinate (TOPROL-XL) 50 MG 24 hr tablet TAKE 1 TABLET EVERY DAY WITH OR IMMEDIATELY FOLLOWING A MEAL. DISCONTINUE VERAPAMIL 90 tablet 3   mexiletine (MEXITIL) 150 MG capsule Take 2 capsules (300 mg total) by mouth 2 (two) times daily. 360 capsule 2   Multiple Vitamins-Minerals (PRESERVISION AREDS 2 PO) Take 1 capsule by mouth 2 (two) times daily.     nitroGLYCERIN (NITROSTAT) 0.4 MG SL tablet Place 1 tablet (0.4 mg total) under the tongue every 5 (five) minutes as needed for chest pain (x 3 doses). 25 tablet 0   pantoprazole (PROTONIX) 40 MG tablet Take 1 tablet (40 mg total) by mouth daily. 90 tablet 3  prednisoLONE acetate (PRED FORTE) 1 % ophthalmic suspension Place 1 drop into both eyes in the morning and at bedtime.     sildenafil (REVATIO) 20 MG tablet Take 1-5 tablets by mouth daily as needed.     traMADol (ULTRAM) 50 MG tablet Take 50 mg by mouth every 6 (six) hours as needed for  moderate pain.     triamcinolone ointment (KENALOG) 0.5 % Apply 1 Application topically 2 (two) times daily. Mix with CeraVe cream 1:1.     No current facility-administered medications on file prior to visit.    Allergies:   Allergies  Allergen Reactions   Topiramate Anxiety, Palpitations and Other (See Comments)    blurred vision, tired, ringing in ears, restlessness, confusion, rapid heart rate, made him jittery   Celecoxib Other (See Comments)    GI Upset; did not mix with his Nexium/Caused acid relux     Pitavastatin Rash and Cough    LIVALO    Rosuvastatin Calcium Other (See Comments)    Muscle aches    Hydrocodone-Acetaminophen Other (See Comments)    Delusions     Physical Exam General: well developed, well nourished, seated, in no evident distress Head: head normocephalic and atraumatic.  Neck: supple with no carotid or supraclavicular bruits Cardiovascular: regular rate and rhythm, no murmurs Musculoskeletal: no deformity Skin:  no rash/petichiae Vascular:  Normal pulses all extremities There were no vitals filed for this visit.  Neurologic Exam Mental Status: Awake and fully alert. Oriented to place and time. Recent and remote memory intact. Attention span, concentration and fund of knowledge appropriate. Mood and affect appropriate.  Speech is clear without aphasia or dysarthria.  Good naming, repetition and comprehension. Cranial Nerves: Fundoscopic exam reveals sharp disc margins. Pupils equal, briskly reactive to light. Extraocular movements full without nystagmus. Visual fields full to confrontation. Hearing intact. Facial sensation intact. Face, tongue, palate moves normally and symmetrically.  Motor: Normal bulk and tone. Normal strength in all tested extremity muscles. Sensory.: intact to touch ,pinprick .position and vibratory sensation.  Coordination: Rapid alternating movements normal in all extremities. Finger-to-nose and heel-to-shin performed accurately  bilaterally. Gait and Station: Arises from chair without difficulty. Stance is normal. Gait demonstrates normal stride length and balance . Able to heel, toe and tandem walk without difficulty.  Reflexes: 1+ and symmetric. Toes downgoing.       ASSESSMENT: 72 year old male with transient episode of expressive aphasia likely left hemispheric TIA June 2024.  Vascular risk factors of coronary artery disease, hypertension, hyperlipidemia.  New onset episode of transient tremors and speech disturbance in November 2024 unlikely to be TIA and likely medication effect of gabapentin.    PLAN:  Hx of TIA Continue aspirin 81 mg daily and atorvastatin 40 mg daily for secondary stroke prevention managed/prescribed by PCP Continue close PCP follow-up for aggressive stroke risk factor management Tremors Suspected side effect of gabapentin *** TSH 06/2023 normal    I had a long question the patient and his wife regarding his recent episode of increased tremor with transient speech disturbance which I agree it does not sound like a TIA.  This is likely medication effect and I agree with tapering and discontinuing gabapentin if he does not need it.  I also recommend we check thyroid panel lab today.  Continue aspirin for stroke prevention and maintain aggressive risk factor modification with strict control of hypertension with blood pressure goal below 130/90, lipids with LDL cholesterol goal below 70 mg percent and diabetes with hemoglobin A1c goal  below 6.5%.  He will return for follow-up in the future in 6 months with my nurse practitioner or call earlier if necessary.   I spent *** minutes of face-to-face and non-face-to-face time with patient.  This included previsit chart review, lab review, study review, order entry, electronic health record documentation, patient education and discussion regarding above diagnoses and treatment plan and answered all other questions to patient's satisfaction  Ihor Austin, [facility]  [facility] [address] [place], [place] [address]  Phone [phone] Fax [phone] Note: This document was prepared with digital dictation and possible smart phrase technology. Any transcriptional errors that result from this process are unintentional.

## 2023-08-01 ENCOUNTER — Ambulatory Visit: Payer: Medicare HMO | Admitting: Adult Health

## 2023-08-03 ENCOUNTER — Other Ambulatory Visit: Payer: Self-pay | Admitting: Cardiology

## 2023-08-03 NOTE — Progress Notes (Signed)
Carelink Summary Report / Loop Recorder 

## 2023-08-07 ENCOUNTER — Telehealth: Payer: Self-pay

## 2023-08-07 NOTE — Telephone Encounter (Signed)
Pt is coming to the office to get help with his [employer] app.

## 2023-08-07 NOTE — Telephone Encounter (Signed)
Pt came by the office today was having problems with about the Itamar study. I review with the pt that I gave him the PIN#. The problem he had is that when he was in the office he could not remember his pw for his apple sign in and was going to have his grandson help him. Pt said he forgot. I tried to add the app on today but again we got to the apple sign in and he could not remember his sign in. I wrote down for the pt to ask his grandson if he could him and what the name of the app is called. I also gave the pt the 888 # as well incase he other questions once he can get in to his apple app store to download the app.

## 2023-08-14 ENCOUNTER — Ambulatory Visit (INDEPENDENT_AMBULATORY_CARE_PROVIDER_SITE_OTHER): Payer: Medicare HMO

## 2023-08-14 DIAGNOSIS — G459 Transient cerebral ischemic attack, unspecified: Secondary | ICD-10-CM

## 2023-08-14 LAB — CUP PACEART REMOTE DEVICE CHECK
Date Time Interrogation Session: 20241215231835
Implantable Pulse Generator Implant Date: 20240903

## 2023-08-18 ENCOUNTER — Telehealth: Payer: Self-pay | Admitting: *Deleted

## 2023-08-18 NOTE — Telephone Encounter (Signed)
Spoke to pt about Itamar study. He states he is having trouble with it, says he has tried to start it twice and can't figure it out. Aware I am forwarding to sleep study team to call pt and guide patient further on how to complete this study. Pt agreeable to plan.

## 2023-08-18 NOTE — Telephone Encounter (Signed)
Spoke with patient and gave patient education on how to [employer] App on phone and the steps to proceed with testing. All questions were answered, patient  verbalized understanding and says he will complete test this weekend.

## 2023-09-04 ENCOUNTER — Ambulatory Visit: Payer: Medicare HMO | Admitting: Adult Health

## 2023-09-11 ENCOUNTER — Encounter (INDEPENDENT_AMBULATORY_CARE_PROVIDER_SITE_OTHER): Payer: Medicare HMO | Admitting: Ophthalmology

## 2023-09-11 DIAGNOSIS — H353132 Nonexudative age-related macular degeneration, bilateral, intermediate dry stage: Secondary | ICD-10-CM

## 2023-09-11 DIAGNOSIS — H35033 Hypertensive retinopathy, bilateral: Secondary | ICD-10-CM | POA: Diagnosis not present

## 2023-09-11 DIAGNOSIS — H59033 Cystoid macular edema following cataract surgery, bilateral: Secondary | ICD-10-CM

## 2023-09-11 DIAGNOSIS — I10 Essential (primary) hypertension: Secondary | ICD-10-CM

## 2023-09-11 DIAGNOSIS — H35373 Puckering of macula, bilateral: Secondary | ICD-10-CM

## 2023-09-11 DIAGNOSIS — H43813 Vitreous degeneration, bilateral: Secondary | ICD-10-CM

## 2023-09-18 ENCOUNTER — Ambulatory Visit (INDEPENDENT_AMBULATORY_CARE_PROVIDER_SITE_OTHER): Payer: Medicare HMO

## 2023-09-18 DIAGNOSIS — G459 Transient cerebral ischemic attack, unspecified: Secondary | ICD-10-CM | POA: Diagnosis not present

## 2023-09-18 LAB — CUP PACEART REMOTE DEVICE CHECK
Date Time Interrogation Session: 20250119232519
Implantable Pulse Generator Implant Date: 20240903

## 2023-09-18 NOTE — Progress Notes (Signed)
Carelink Summary Report / Loop Recorder 

## 2023-09-25 ENCOUNTER — Encounter (INDEPENDENT_AMBULATORY_CARE_PROVIDER_SITE_OTHER): Payer: Medicare HMO | Admitting: Cardiology

## 2023-09-25 DIAGNOSIS — G4733 Obstructive sleep apnea (adult) (pediatric): Secondary | ICD-10-CM | POA: Diagnosis not present

## 2023-10-04 ENCOUNTER — Telehealth: Payer: Self-pay | Admitting: Cardiology

## 2023-10-04 NOTE — Telephone Encounter (Signed)
 Called and spoke to patient. Verified name and DOB. Patient report yesterday when he was having eye surgery they noticed his HR was 33. He recheck it when he got home and it was 36. Later on he checked it again and it went up to 58. He does report feeling jittery but deny any other symptoms at this time. He stated he was not aware of his HR being that low at any other time. He does have a loop recorder and asked if that would alert that his HR was low. I did speak to Dr Francyne and he informed me that the loop recorder HR alerts are usually set at 30. Relayed that information to patient. Patient was not were he could check his HR at this time. Patient was advised to monitor his HR.  Patient verbalize understanding and agree. Please advise.

## 2023-10-04 NOTE — Telephone Encounter (Signed)
 STAT if HR is under 50 or over 120 (normal HR is 60-100 beats per minute)  What is your heart rate? 58  Do you have a log of your heart rate readings (document readings)? 36,58  Do you have any other symptoms? Pt  was having eye surgery yesterday and his HR dropped to 33

## 2023-10-05 ENCOUNTER — Ambulatory Visit: Payer: Medicare HMO | Attending: Cardiology

## 2023-10-05 DIAGNOSIS — R400 Somnolence: Secondary | ICD-10-CM

## 2023-10-05 DIAGNOSIS — R0683 Snoring: Secondary | ICD-10-CM

## 2023-10-05 NOTE — Telephone Encounter (Signed)
 Remote transmission reviewed 10/05/23. No episodes noted. Explained to patient PVC's can cause the heart rate to appear slower than it is on pulse oximeters/monitors. Patient states he would feel more comfortable seeing a provider reviewing information. Do note increase in PVC's and pt taking mexiletine. Pt has EKG with him from eye doc and will be to apt. Advised to call if he has further questions or concerns.

## 2023-10-05 NOTE — Telephone Encounter (Signed)
 Patient called to report his HR this morning was 58 and BP 138/81.  Patient stated he has not taken any metoprolol  succinate (TOPROL -XL) 50 MG 24 hr tablet or mexiletine (MEXITIL ) 150 MG capsule and wants advice on next steps.

## 2023-10-11 ENCOUNTER — Telehealth: Payer: Self-pay

## 2023-10-11 NOTE — Telephone Encounter (Signed)
Notified patient of sleep study results and recommendations. All questions were answered and patient verbalized understanding. Message sent to LN for scheduling.

## 2023-10-11 NOTE — Telephone Encounter (Signed)
-----   Message from Armanda Magic sent at 10/05/2023  6:48 PM EST ----- Patient has very mild OSA - set up OV to discuss treatment options.

## 2023-10-22 NOTE — Progress Notes (Unsigned)
 Electrophysiology Office Note:   Date:  10/23/2023  ID:  Duane Reyes, DOB Jun 15, 1951, MRN [id number]  Primary Cardiologist: Armanda Magic, MD Primary Heart Failure: None Electrophysiologist: Will Jorja Loa, MD      History of Present Illness:   Duane Reyes is a 73 y.o. male with h/o PVC's, TIA 01/2023, suspected OSA seen today for routine electrophysiology followup.   The patient called the clinic after noting a slow HR on his pulse oximeter at home and at his eye doctor for a procedure. RN discussed his PVC burden and falsely low readings.  PT requested to be seen by a provider.  ILR review at that time showed 11.6% burden.   Since last being seen in our clinic the patient reports he had his sleep study done but has not talked to anyone about it. He notes he has mild sleep apnea.  He reports he was to have an eye surgery and they noted his HR to be in the 30's.  They avoided anesthesia and were able to complete the surgery without it.    He denies chest pain, palpitations, dyspnea, PND, orthopnea, nausea, vomiting, dizziness, syncope, edema, weight gain, or early satiety.   Review of systems complete and found to be negative unless listed in HPI.   EP Information / Studies Reviewed:    EKG is not ordered today. EKG from 06/30/2023 reviewed which showed NSR 68 bpm      Studies:  Cardiac Monitor 2018 > 46% PVC burden  Cardiac Monitor 05/2020 > 3.6% PVC's, predominantly NSR  Cardiac Montior 12/2021 > 22.8% PVC burden  ECHO 01/2023 > LVEF 45-50%, G1DD   Arrhythmia / AAD PVC's   Mexiletine   Device:  MDT ILR 9/32024    Risk Assessment/Calculations:              Physical Exam:   VS:  BP 138/80   Pulse 75   Ht 5\' 7"  (1.702 m)   Wt 167 lb 9.6 oz (76 kg)   SpO2 97%   BMI 26.25 kg/m    Wt Readings from Last 3 Encounters:  10/23/23 167 lb 9.6 oz (76 kg)  07/10/23 172 lb (78 kg)  07/01/23 177 lb (80.3 kg)     GEN: Well nourished, well developed in no acute  distress NECK: No JVD; No carotid bruits CARDIAC: Regular rate and rhythm, no murmurs, rubs, gallops RESPIRATORY:  Clear to auscultation without rales, wheezing or rhonchi  ABDOMEN: Soft, non-tender, non-distended EXTREMITIES:  No edema; No deformity  MSK: scoliosis noted  ASSESSMENT AND PLAN:    PVC's  PVC burden in 2018 46%, Intolerant of CCB / BB in past due to fatigue  -continue mexiletine 300 mg BID  -metoprolol XL 50 mg daily  -2.5% PVC burden on ILR review  -reviewed that he has not had any Brady episodes on his ILR (settings set for 13 beats at <30 bpm), also discussed concept of PVC's giving falsely low reading particularly with automated BP machines -ok to continue metoprolol as above, will review with patient again in 1 month. Note his PVC burden has significantly reduced > looking at histograms he waxes and wanes in % burden   CAD  -no anginal symptoms   Hypertension  -well controlled on current regimen    Hx TIA  01/2023, no obvious cause identified  -ILR in place, no AF on review  Daytime Fatigue, Obesity, Mild OSA  -sleep study results with mild OSA, AHI 6.1/hr -follow up with Dr.  Turner regarding device needs if any    Follow up with EP APP in 4 weeks  Signed, Canary Brim, NP-C, AGACNP-BC [facility] - Electrophysiology  10/23/2023, 6:19 PM

## 2023-10-23 ENCOUNTER — Ambulatory Visit (INDEPENDENT_AMBULATORY_CARE_PROVIDER_SITE_OTHER): Payer: Medicare HMO

## 2023-10-23 ENCOUNTER — Ambulatory Visit: Payer: Medicare HMO | Attending: Pulmonary Disease | Admitting: Pulmonary Disease

## 2023-10-23 ENCOUNTER — Encounter: Payer: Self-pay | Admitting: Pulmonary Disease

## 2023-10-23 VITALS — BP 138/80 | HR 75 | Ht 67.0 in | Wt 167.6 lb

## 2023-10-23 DIAGNOSIS — I10 Essential (primary) hypertension: Secondary | ICD-10-CM

## 2023-10-23 DIAGNOSIS — R001 Bradycardia, unspecified: Secondary | ICD-10-CM

## 2023-10-23 DIAGNOSIS — G459 Transient cerebral ischemic attack, unspecified: Secondary | ICD-10-CM | POA: Diagnosis not present

## 2023-10-23 DIAGNOSIS — I2510 Atherosclerotic heart disease of native coronary artery without angina pectoris: Secondary | ICD-10-CM

## 2023-10-23 DIAGNOSIS — I493 Ventricular premature depolarization: Secondary | ICD-10-CM

## 2023-10-23 DIAGNOSIS — R400 Somnolence: Secondary | ICD-10-CM

## 2023-10-23 NOTE — Patient Instructions (Signed)
 Medication Instructions:  Your physician recommends that you continue on your current medications as directed. Please refer to the Current Medication list given to you today.  *If you need a refill on your cardiac medications before your next appointment, please call your pharmacy*  Lab Work: None ordered If you have labs (blood work) drawn today and your tests are completely normal, you will receive your results only by: MyChart Message (if you have MyChart) OR A paper copy in the mail If you have any lab test that is abnormal or we need to change your treatment, we will call you to review the results.  Follow-Up: At [facility], you and your health needs are our priority.  As part of our continuing mission to provide you with exceptional heart care, we have created designated Provider Care Teams.  These Care Teams include your primary Cardiologist (physician) and Advanced Practice Providers (APPs -  Physician Assistants and Nurse Practitioners) who all work together to provide you with the care you need, when you need it.  Your next appointment:   1 month(s)  Provider:   Canary Brim, NP   Schedule 2-3 months or next available with Dr Mayford Knife

## 2023-10-24 LAB — CUP PACEART REMOTE DEVICE CHECK
Date Time Interrogation Session: 20250223232024
Implantable Pulse Generator Implant Date: 20240903

## 2023-10-26 NOTE — Progress Notes (Signed)
 Carelink Summary Report / Loop Recorder

## 2023-11-07 ENCOUNTER — Ambulatory Visit: Attending: Cardiology | Admitting: Cardiology

## 2023-11-07 ENCOUNTER — Encounter: Payer: Self-pay | Admitting: Cardiology

## 2023-11-07 VITALS — BP 118/74 | HR 65 | Ht 67.0 in | Wt 168.4 lb

## 2023-11-07 DIAGNOSIS — I10 Essential (primary) hypertension: Secondary | ICD-10-CM | POA: Diagnosis not present

## 2023-11-07 DIAGNOSIS — I493 Ventricular premature depolarization: Secondary | ICD-10-CM | POA: Diagnosis not present

## 2023-11-07 DIAGNOSIS — I2510 Atherosclerotic heart disease of native coronary artery without angina pectoris: Secondary | ICD-10-CM

## 2023-11-07 DIAGNOSIS — E785 Hyperlipidemia, unspecified: Secondary | ICD-10-CM | POA: Diagnosis not present

## 2023-11-07 DIAGNOSIS — G4733 Obstructive sleep apnea (adult) (pediatric): Secondary | ICD-10-CM

## 2023-11-07 NOTE — Patient Instructions (Signed)
 Medication Instructions:  No changes *If you need a refill on your cardiac medications before your next appointment, please call your pharmacy*   Lab Work: none If you have labs (blood work) drawn today and your tests are completely normal, you will receive your results only by: MyChart Message (if you have MyChart) OR A paper copy in the mail If you have any lab test that is abnormal or we need to change your treatment, we will call you to review the results.   Testing/Procedures: none   Follow-Up: At [facility], you and your health needs are our priority.  As part of our continuing mission to provide you with exceptional heart care, we have created designated Provider Care Teams.  These Care Teams include your primary Cardiologist (physician) and Advanced Practice Providers (APPs -  Physician Assistants and Nurse Practitioners) who all work together to provide you with the care you need, when you need it.  We recommend signing up for the patient portal called "MyChart".  Sign up information is provided on this After Visit Summary.  MyChart is used to connect with patients for Virtual Visits (Telemedicine).  Patients are able to view lab/test results, encounter notes, upcoming appointments, etc.  Non-urgent messages can be sent to your provider as well.   To learn more about what you can do with MyChart, go to [email or web address].    Your next appointment:   12 month(s)  Provider:   Armanda Magic, MD     Other Instructions

## 2023-11-07 NOTE — Progress Notes (Signed)
 Cardiology Office Note:    Date:  11/07/2023   ID:  Duane Reyes, DOB 30-Nov-1950, MRN [id number]  PCP:  Duane Inch, MD  Cardiologist:  Duane Magic, MD    Referring MD: Duane Inch, MD   Chief Complaint  Patient presents with   Coronary Artery Disease   Hypertension   Hyperlipidemia   Sleep Apnea    History of Present Illness:    Duane Reyes is a 73 y.o. male with a hx of  HTN, dyslipidemia, PVCs and GERD who presents back for followup.  Coronary CTA was done for chest pain which showed significant calcification of the coronary arteries and subsequently underwent cath 01/2016 which revealed 50% mid LAD, 25% OM2, 95% RPDA s/p PCI.  2D echo 04/08/2021 showed mildly reduced LV function with EF 45 to 50% with global hypokinesis, trivial MR.  He has been followed by Dr. Elberta Reyes and at his last OV he complained of snoring and daytime sleepiness and he ordered a home sleep study which he underwent 09/25/2023 which showed mild obstructive sleep apnea with an AHI of 6.1/h with most events occurring in the supine position.  He is now back for follow-up of his sleep study. He wakes up at 4am and feels rested when he gets up in the am.  He goes to bed around 11pm.  He falls asleep on the couch in the evening prior to going to bed.  He occasionally will take a daytime nap for 15 in to an hour. His wife says that he does not snore and she has never seen him have apneas during sleep.  He has no am HAs.   He is here today for followup and is doing well.  He denies any chest pain or pressure, SOB, DOE, PND, orthopnea, LE edema, dizziness, palpitations or syncope. He is compliant with his meds and is tolerating meds with no SE.    Past Medical History:  Diagnosis Date   Arthritis    "left knee" (01/29/2016)   Bradycardia, drug induced 10/26/2016   Bronchiolitis    CAD in native artery    a. Abnl cardiac CT ; b. LHC 01/2016 s/p 50% mLAD, 25% OM2, 95% RPDA which was treated with  overlapping DES; c. MV 03/2021 negative for ischemia   Chronic combined systolic and diastolic heart failure (HCC)    HFmrEF, echo 03/2021: EF 45-50%, GIDD.   Chronic knee pain    Chronic lower back pain    DOE (dyspnea on exertion)    Dyspnea    Frequent PVCs    a. 12/2015 - Holter showed SB, NSR, ST, HR 54-108, with frequent PVCs in singles, couplets, and bigemy, with elevated PVC load 24%.   GERD (gastroesophageal reflux disease)    History of shingles    Hyperlipidemia    Hypertension    Hypothyroidism    OSA (obstructive sleep apnea)    mild obstructive sleep apnea with an AHI of 6.1/h with all events occurring in the supine position   Prostate cancer [facility])    Scoliosis    Spinal stenosis    Spondylolisthesis of lumbar region    TIA (transient ischemic attack) 01/2023   the Monday after Father's Day in 2024- taken off of PLAVIX on 03/06/2023- added 81 mg ASA daily   Tortuosity of aortic arch    Tortuosity of aortic arch, -would not pursue Rt radial approach in this patient for future cardiac caths     Past Surgical History:  Procedure Laterality Date   ANGIOPLASTY     BACK SURGERY     CARDIAC CATHETERIZATION N/A 01/29/2016   Procedure: Right/Left Heart Cath and Coronary Angiography;  Surgeon: Duane Crafts, MD;  Location: [facility] INVASIVE CV LAB;  Service: Cardiovascular;  Laterality: N/A;   CARDIAC CATHETERIZATION N/A 01/29/2016   Procedure: Coronary Stent Intervention 2.5/16mm Synergy-proximal PDA;  Surgeon: Duane Crafts, MD;  Location: [facility] INVASIVE CV LAB;  Service: Cardiovascular;  Laterality: N/A;   CATARACT EXTRACTION W/ INTRAOCULAR LENS IMPLANT Left 11/2015   COLONOSCOPY     CORONARY ANGIOPLASTY WITH STENT PLACEMENT  01/29/2016   "2 stents"   CYST EXCISION Right 2005   "near bicep"   ESOPHAGOGASTRODUODENOSCOPY     EYE SURGERY     INGUINAL HERNIA REPAIR     JOINT REPLACEMENT     KNEE ARTHROSCOPY Left 2012   "torn meniscus"   LOOP RECORDER IMPLANT   05/02/2023   POSTERIOR LUMBAR FUSION  02/2013   "L4-5; hardware in place"   PROSTATE BIOPSY  2013   PROSTATE SURGERY  2013   "brachytherapy for prostate radiation"   TONSILLECTOMY  1950s   TOTAL KNEE ARTHROPLASTY Left 12/18/2017   TOTAL KNEE ARTHROPLASTY Left 12/18/2017   Procedure: TOTAL KNEE ARTHROPLASTY;  Surgeon: Dannielle Huh, MD;  Location: [facility] OR;  Service: Orthopedics;  Laterality: Left;    Current Medications: Current Meds  Medication Sig   acetaminophen (TYLENOL) 500 MG tablet Take 500 mg by mouth every 6 (six) hours as needed.   albuterol (PROAIR HFA) 108 (90 Base) MCG/ACT inhaler Inhale 2 puffs into the lungs every 6 (six) hours as needed (for wheezing or shortness of breath).   aspirin EC 81 MG tablet Take 1 tablet (81 mg total) by mouth daily. Swallow whole.   atorvastatin (LIPITOR) 80 MG tablet Take 1 tablet (80 mg total) by mouth daily. (Patient taking differently: Take 40 mg by mouth every evening.)   baclofen (LIORESAL) 10 MG tablet Take 10 mg by mouth as needed.   budesonide-formoterol (SYMBICORT) 160-4.5 MCG/ACT inhaler Inhale 2 puffs into the lungs 2 (two) times daily as needed (flares).   Cholecalciferol (VITAMIN D-3 PO) Take 1 capsule by mouth daily.   clobetasol cream (TEMOVATE) 0.05 % Apply topically.   Coenzyme Q10 (CO Q-10) 120 MG CAPS Take 120 mg by mouth daily.   diclofenac Sodium (VOLTAREN) 1 % GEL Apply 2 g topically as needed (pain). 3-4 times daily   fluticasone (FLONASE) 50 MCG/ACT nasal spray Place into the nose.   ketorolac (ACULAR) 0.5 % ophthalmic solution Place 1 drop into both eyes in the morning and at bedtime.   levothyroxine (SYNTHROID, LEVOTHROID) 125 MCG tablet Take 125 mcg by mouth daily before breakfast.   metoprolol succinate (TOPROL-XL) 50 MG 24 hr tablet TAKE 1 TABLET EVERY DAY WITH OR IMMEDIATELY FOLLOWING A MEAL. DISCONTINUE VERAPAMIL   mexiletine (MEXITIL) 150 MG capsule TAKE 2 CAPSULES (300 MG TOTAL) BY MOUTH 2 (TWO) TIMES DAILY.    Multiple Vitamins-Minerals (PRESERVISION AREDS 2 PO) Take 1 capsule by mouth 2 (two) times daily.   nitroGLYCERIN (NITROSTAT) 0.4 MG SL tablet Place 1 tablet (0.4 mg total) under the tongue every 5 (five) minutes as needed for chest pain (x 3 doses).   pantoprazole (PROTONIX) 40 MG tablet Take 1 tablet (40 mg total) by mouth daily.   prednisoLONE acetate (PRED FORTE) 1 % ophthalmic suspension Place 1 drop into both eyes in the morning and at bedtime.   sildenafil (REVATIO) 20 MG  tablet Take 1-5 tablets by mouth daily as needed.   traMADol (ULTRAM) 50 MG tablet Take 50 mg by mouth every 6 (six) hours as needed for moderate pain.   triamcinolone ointment (KENALOG) 0.5 % Apply 1 Application topically 2 (two) times daily. Mix with CeraVe cream 1:1.     Allergies:   Topiramate, Celecoxib, Pitavastatin, Rosuvastatin calcium, and Hydrocodone-acetaminophen   Social History   Socioeconomic History   Marital status: Married    Spouse name: Not on file   Number of children: Not on file   Years of education: Not on file   Highest education level: Not on file  Occupational History   Not on file  Tobacco Use   Smoking status: Never   Smokeless tobacco: Never  Vaping Use   Vaping status: Never Used  Substance and Sexual Activity   Alcohol use: Not Currently    Comment: 01/29/2016 "glass of wine q couple months"   Drug use: No   Sexual activity: Not Currently  Other Topics Concern   Not on file  Social History Narrative   Not on file   Social Drivers of Health   Financial Resource Strain: Low Risk  (05/23/2023)   Received from [facility]   Overall Financial Resource Strain (CARDIA)    Difficulty of Paying Living Expenses: Not hard at all  Food Insecurity: No Food Insecurity (05/23/2023)   Received from [facility]   Hunger Vital Sign    Worried About Running Out of Food in the Last Year: Never true    Ran Out of Food in the Last Year: Never true  Transportation Needs: No  Transportation Needs (05/23/2023)   Received from [facility] - Transportation    Lack of Transportation (Medical): No    Lack of Transportation (Non-Medical): No  Physical Activity: Insufficiently Active (05/23/2023)   Received from [facility]   Exercise Vital Sign    Days of Exercise per Week: 3 days    Minutes of Exercise per Session: 30 min  Stress: No Stress Concern Present (05/23/2023)   Received from [facility] - Occupational Stress Questionnaire    Feeling of Stress : Not at all  Social Connections: Socially Integrated (05/23/2023)   Received from [facility]   Social Network    How would you rate your social network (family, work, friends)?: Good participation with social networks     Family History: The patient's family history includes Heart attack in his father; Heart disease in his father. There is no history of Colon polyps, Colon cancer, Esophageal cancer, Rectal cancer, or Stomach cancer.  ROS:   Please see the history of present illness.    ROS  All other systems reviewed and negative.   EKGs/Labs/Other Studies Reviewed:    The following studies were reviewed today:   Recent Labs: 02/13/2023: Magnesium 2.1 06/30/2023: ALT 37; BUN 17; Creatinine, Ser 1.01; Hemoglobin 15.0; Platelets 173; Potassium 3.9; Sodium 139 07/10/2023: TSH 2.230   Recent Lipid Panel    Component Value Date/Time   CHOL 117 02/14/2023 0217   CHOL 149 10/26/2016 0927   TRIG 67 02/14/2023 0217   HDL 44 02/14/2023 0217   HDL 53 10/26/2016 0927   CHOLHDL 2.7 02/14/2023 0217   VLDL 13 02/14/2023 0217   LDLCALC 60 02/14/2023 0217   LDLCALC 73 10/26/2016 0927     Physical Exam:    VS:  BP 118/74   Pulse 65   Ht  5\' 7"  (1.702 m)   Wt 168 lb 6.4 oz (76.4 kg)   SpO2 97%   BMI 26.38 kg/m     Wt Readings from Last 3 Encounters:  11/07/23 168 lb 6.4 oz (76.4 kg)  10/23/23 167 lb 9.6 oz (76 kg)  07/10/23 172 lb (78 kg)     GEN: Well nourished, well developed in no acute distress HEENT: Normal NECK: No JVD; No carotid bruits LYMPHATICS: No lymphadenopathy CARDIAC:RRR, no murmurs, rubs, gallops RESPIRATORY:  Clear to auscultation without rales, wheezing or rhonchi  ABDOMEN: Soft, non-tender, non-distended MUSCULOSKELETAL:  No edema; No deformity  SKIN: Warm and dry NEUROLOGIC:  Alert and oriented x 3 PSYCHIATRIC:  Normal affect  ASSESSMENT:    1. Coronary artery disease involving native coronary artery of native heart without angina pectoris   2. Primary hypertension   3. Dyslipidemia   4. PVC's (premature ventricular contractions)   5. OSA (obstructive sleep apnea)     PLAN:    In order of problems listed above:   ASCAD  -1 vessel obstructive with 50% mid LAD, 25% OM2, 95% RPDA s/p PCI in 2017.   -He has not had any anginal symptoms since I saw him last -Stress Myoview 04/14/2021 showed EF 50% with no ischemia -Continue prescription drug management with aspirin 81 mg daily, atorvastatin 80 mg daily, Toprol XL 50 mg daily with as needed refills  2.  HTN  -BP controlled on exam today -Continue prescription drug management with Toprol XL 50 mg daily with as needed refills  3.  Hyperlipidemia  - LDL goal < 70.  -I have personally reviewed and interpreted outside labs performed by patient's PCP which showed LDL 60 and HDL 44 on 02/14/2023 and ALT 37 on 06/30/2023 -Continue prescription drug managed with atorvastatin 80 mg daily with as needed refills  4.  PVCs  -Followed by EP>>denies any palpitations -Continue drug management with Toprol-XL 50 mg daily and mexiletine 150 mg 2 tablets twice daily  5.  Obstructive sleep apnea -Recent sleep study showed minimal obstructive sleep apnea with an AHI of 6.1/h mainly occurring during supine sleep -I doubt that this minimal sleep apnea is causing his PVCs -he denies any significant daytime sleepiness and feels rested in the am with no snoring or apnea  per his wife -Have encouraged him to try avoid sleeping supine -He does not have any significant excessive daytime sleepiness so I do not think CPAP therapy is warranted at this time    Medication Adjustments/Labs and Tests Ordered: Current medicines are reviewed at length with the patient today.  Concerns regarding medicines are outlined above.  No orders of the defined types were placed in this encounter.  No orders of the defined types were placed in this encounter.   Signed, Duane Magic, MD  11/07/2023 10:02 AM    [facility]

## 2023-11-19 NOTE — Progress Notes (Unsigned)
  Electrophysiology Office Note:   Date:  11/20/2023  ID:  Duane Reyes, DOB 1951-01-28, MRN [id number]  Primary Cardiologist: Armanda Magic, MD Primary Heart Failure: None Electrophysiologist: Will Jorja Loa, MD      History of Present Illness:   Duane Reyes is a 73 y.o. male with h/o PVC's, TIA 01/2023, suspected OSA  seen today for routine electrophysiology followup.   Seen in EP Clinic 10/23/23 after noting a slow HR on pulse oximeter & while at his eye doctor.  His PVC burden on ILR at that time was 11.6%.  No prolonged brady episodes on ILR.  Since last being seen in our clinic the patient reports he has been doing well. He wonders if his HR has been slow since we made the changes on his ILR monitoring. He feels well overall. He notes he does get tired with a day full of activity.  He feels he needs to be more active. He does not have chest pain with exertion.  No GI issues on mexiletine.   He denies chest pain, palpitations, dyspnea, PND, orthopnea, nausea, vomiting, dizziness, syncope, edema, weight gain, or early satiety.   Review of systems complete and found to be negative unless listed in HPI.   EP Information / Studies Reviewed:    EKG is not ordered today. EKG from 06/30/23 reviewed which showed NSR 68 bpm      Studies:  Cardiac Monitor 2018 > 46% PVC burden  Cardiac Monitor 05/2020 > 3.6% PVC's, predominantly NSR  Cardiac Montior 12/2021 > 22.8% PVC burden  ECHO 01/2023 > LVEF 45-50%, G1DD     Arrhythmia / AAD PVC's   Mexiletine    Device:  MDT ILR 9/32024           Physical Exam:   VS:  BP 138/78   Pulse (!) 53   Ht 5\' 7"  (1.702 m)   Wt 165 lb 12.8 oz (75.2 kg)   SpO2 97%   BMI 25.97 kg/m    Wt Readings from Last 3 Encounters:  11/20/23 165 lb 12.8 oz (75.2 kg)  11/07/23 168 lb 6.4 oz (76.4 kg)  10/23/23 167 lb 9.6 oz (76 kg)     GEN: Well nourished, well developed in no acute distress NECK: No JVD; No carotid bruits CARDIAC: Regular rate  and rhythm, no murmurs, rubs, gallops RESPIRATORY:  Clear to auscultation without rales, wheezing or rhonchi  ABDOMEN: Soft, non-tender, non-distended EXTREMITIES:  No edema; No deformity  MSK: significant scoliosis noted with curve to right  ASSESSMENT AND PLAN:    PVC's  Bradycardia  PVC burden in 2018 46%, Intolerant of CCB / BB in past due to fatigue. Hx of waxing / waning % burden of PVC's -mexiletine 300 mg BID  -metoprolol XL 50 mg daily  -IRL with 10.8% PVC's  -no brady episodes > programmed for <30 bpm / 12 beats in a row on ILR   CAD -no anginal symptoms   Hypertension  -well controlled on current regimen  > initially elevated, good on re-check  Hx TIA  01/2023, no clear cause identified  -ILR in place, no AF on review    Daytime Fatigue, Obesity, Mild OSA  -per Dr. Mayford Knife   Follow up with Dr. Elberta Fortis in 6 months  Signed, Canary Brim, NP-C, AGACNP-BC [facility] - Electrophysiology  11/20/2023, 11:47 AM

## 2023-11-20 ENCOUNTER — Encounter: Payer: Self-pay | Admitting: Pulmonary Disease

## 2023-11-20 ENCOUNTER — Ambulatory Visit: Payer: Medicare HMO | Attending: Pulmonary Disease | Admitting: Pulmonary Disease

## 2023-11-20 VITALS — BP 138/78 | HR 53 | Ht 67.0 in | Wt 165.8 lb

## 2023-11-20 DIAGNOSIS — I2510 Atherosclerotic heart disease of native coronary artery without angina pectoris: Secondary | ICD-10-CM

## 2023-11-20 DIAGNOSIS — G459 Transient cerebral ischemic attack, unspecified: Secondary | ICD-10-CM

## 2023-11-20 DIAGNOSIS — G4733 Obstructive sleep apnea (adult) (pediatric): Secondary | ICD-10-CM

## 2023-11-20 DIAGNOSIS — I493 Ventricular premature depolarization: Secondary | ICD-10-CM | POA: Diagnosis not present

## 2023-11-20 DIAGNOSIS — I10 Essential (primary) hypertension: Secondary | ICD-10-CM | POA: Diagnosis not present

## 2023-11-20 DIAGNOSIS — R001 Bradycardia, unspecified: Secondary | ICD-10-CM

## 2023-11-20 NOTE — Patient Instructions (Signed)
 Medication Instructions:  Your physician recommends that you continue on your current medications as directed. Please refer to the Current Medication list given to you today.  *If you need a refill on your cardiac medications before your next appointment, please call your pharmacy*  Lab Work: None ordered If you have labs (blood work) drawn today and your tests are completely normal, you will receive your results only by: MyChart Message (if you have MyChart) OR A paper copy in the mail If you have any lab test that is abnormal or we need to change your treatment, we will call you to review the results.  Follow-Up: At [facility], you and your health needs are our priority.  As part of our continuing mission to provide you with exceptional heart care, we have created designated Provider Care Teams.  These Care Teams include your primary Cardiologist (physician) and Advanced Practice Providers (APPs -  Physician Assistants and Nurse Practitioners) who all work together to provide you with the care you need, when you need it.  Your next appointment:   6 month(s)  Provider:   Canary Brim, NP

## 2023-11-27 ENCOUNTER — Ambulatory Visit (INDEPENDENT_AMBULATORY_CARE_PROVIDER_SITE_OTHER): Payer: Medicare HMO

## 2023-11-27 DIAGNOSIS — G459 Transient cerebral ischemic attack, unspecified: Secondary | ICD-10-CM

## 2023-11-27 LAB — CUP PACEART REMOTE DEVICE CHECK
Date Time Interrogation Session: 20250330231832
Implantable Pulse Generator Implant Date: 20240903

## 2023-11-28 NOTE — Progress Notes (Signed)
 Carelink Summary Report / Loop Recorder

## 2023-11-28 NOTE — Addendum Note (Signed)
 Addended by: Geralyn Flash D on: 11/28/2023 04:05 PM   Modules accepted: Orders

## 2023-12-04 ENCOUNTER — Emergency Department (HOSPITAL_BASED_OUTPATIENT_CLINIC_OR_DEPARTMENT_OTHER)

## 2023-12-04 ENCOUNTER — Emergency Department (HOSPITAL_BASED_OUTPATIENT_CLINIC_OR_DEPARTMENT_OTHER)
Admission: EM | Admit: 2023-12-04 | Discharge: 2023-12-04 | Disposition: A | Discharge status: Home / Self Care | Attending: Emergency Medicine | Admitting: Emergency Medicine

## 2023-12-04 ENCOUNTER — Encounter (HOSPITAL_BASED_OUTPATIENT_CLINIC_OR_DEPARTMENT_OTHER): Payer: Self-pay | Admitting: Emergency Medicine

## 2023-12-04 ENCOUNTER — Other Ambulatory Visit: Payer: Self-pay

## 2023-12-04 DIAGNOSIS — R251 Tremor, unspecified: Secondary | ICD-10-CM | POA: Diagnosis present

## 2023-12-04 DIAGNOSIS — R4701 Aphasia: Secondary | ICD-10-CM | POA: Diagnosis not present

## 2023-12-04 DIAGNOSIS — Z7982 Long term (current) use of aspirin: Secondary | ICD-10-CM | POA: Diagnosis not present

## 2023-12-04 DIAGNOSIS — I440 Atrioventricular block, first degree: Secondary | ICD-10-CM | POA: Diagnosis not present

## 2023-12-04 DIAGNOSIS — J0100 Acute maxillary sinusitis, unspecified: Secondary | ICD-10-CM | POA: Insufficient documentation

## 2023-12-04 DIAGNOSIS — J320 Chronic maxillary sinusitis: Secondary | ICD-10-CM

## 2023-12-04 DIAGNOSIS — R001 Bradycardia, unspecified: Secondary | ICD-10-CM | POA: Diagnosis not present

## 2023-12-04 DIAGNOSIS — Z8673 Personal history of transient ischemic attack (TIA), and cerebral infarction without residual deficits: Secondary | ICD-10-CM | POA: Diagnosis not present

## 2023-12-04 DIAGNOSIS — I10 Essential (primary) hypertension: Secondary | ICD-10-CM | POA: Insufficient documentation

## 2023-12-04 DIAGNOSIS — I2510 Atherosclerotic heart disease of native coronary artery without angina pectoris: Secondary | ICD-10-CM | POA: Insufficient documentation

## 2023-12-04 DIAGNOSIS — Y900 Blood alcohol level of less than 20 mg/100 ml: Secondary | ICD-10-CM | POA: Diagnosis not present

## 2023-12-04 DIAGNOSIS — Z79899 Other long term (current) drug therapy: Secondary | ICD-10-CM | POA: Diagnosis not present

## 2023-12-04 DIAGNOSIS — R479 Unspecified speech disturbances: Secondary | ICD-10-CM

## 2023-12-04 DIAGNOSIS — I6782 Cerebral ischemia: Secondary | ICD-10-CM | POA: Diagnosis not present

## 2023-12-04 DIAGNOSIS — R4789 Other speech disturbances: Secondary | ICD-10-CM | POA: Insufficient documentation

## 2023-12-04 DIAGNOSIS — Z8546 Personal history of malignant neoplasm of prostate: Secondary | ICD-10-CM | POA: Insufficient documentation

## 2023-12-04 DIAGNOSIS — E039 Hypothyroidism, unspecified: Secondary | ICD-10-CM | POA: Insufficient documentation

## 2023-12-04 LAB — CBC
HCT: 46.3 % (ref 39.0–52.0)
Hemoglobin: 15.4 g/dL (ref 13.0–17.0)
MCH: 31.4 pg (ref 26.0–34.0)
MCHC: 33.3 g/dL (ref 30.0–36.0)
MCV: 94.5 fL (ref 80.0–100.0)
Platelets: 165 10*3/uL (ref 150–400)
RBC: 4.9 MIL/uL (ref 4.22–5.81)
RDW: 13.6 % (ref 11.5–15.5)
WBC: 7.9 10*3/uL (ref 4.0–10.5)
nRBC: 0 % (ref 0.0–0.2)

## 2023-12-04 LAB — PROTIME-INR
INR: 1 (ref 0.8–1.2)
Prothrombin Time: 13.1 s (ref 11.4–15.2)

## 2023-12-04 LAB — COMPREHENSIVE METABOLIC PANEL WITH GFR
ALT: 17 U/L (ref 0–44)
AST: 17 U/L (ref 15–41)
Albumin: 4.2 g/dL (ref 3.5–5.0)
Alkaline Phosphatase: 67 U/L (ref 38–126)
Anion gap: 8 (ref 5–15)
BUN: 17 mg/dL (ref 8–23)
CO2: 26 mmol/L (ref 22–32)
Calcium: 9.2 mg/dL (ref 8.9–10.3)
Chloride: 106 mmol/L (ref 98–111)
Creatinine, Ser: 0.94 mg/dL (ref 0.61–1.24)
GFR, Estimated: >60 mL/min (ref >60)
Glucose, Bld: 96 mg/dL (ref 70–99)
Potassium: 4 mmol/L (ref 3.5–5.1)
Sodium: 140 mmol/L (ref 135–145)
Total Bilirubin: 1.4 mg/dL — ABNORMAL HIGH (ref 0.0–1.2)
Total Protein: 6.9 g/dL (ref 6.5–8.1)

## 2023-12-04 LAB — APTT: aPTT: 29 s (ref 24–36)

## 2023-12-04 LAB — URINALYSIS, ROUTINE W REFLEX MICROSCOPIC
Bilirubin Urine: NEGATIVE
Glucose, UA: NEGATIVE mg/dL
Hgb urine dipstick: NEGATIVE
Ketones, ur: NEGATIVE mg/dL
Leukocytes,Ua: NEGATIVE
Nitrite: NEGATIVE
Specific Gravity, Urine: 1.027 (ref 1.005–1.030)
pH: 6 (ref 5.0–8.0)

## 2023-12-04 LAB — DIFFERENTIAL
Abs Immature Granulocytes: 0.03 10*3/uL (ref 0.00–0.07)
Basophils Absolute: 0 10*3/uL (ref 0.0–0.1)
Basophils Relative: 1 %
Eosinophils Absolute: 0.2 10*3/uL (ref 0.0–0.5)
Eosinophils Relative: 3 %
Immature Granulocytes: 0 %
Lymphocytes Relative: 11 %
Lymphs Abs: 0.9 10*3/uL (ref 0.7–4.0)
Monocytes Absolute: 0.7 10*3/uL (ref 0.1–1.0)
Monocytes Relative: 9 %
Neutro Abs: 6 10*3/uL (ref 1.7–7.7)
Neutrophils Relative %: 76 %

## 2023-12-04 LAB — ETHANOL: Alcohol, Ethyl (B): <10 mg/dL (ref <10)

## 2023-12-04 LAB — CBG MONITORING, ED: Glucose-Capillary: 87 mg/dL (ref 70–99)

## 2023-12-04 MED ORDER — SODIUM CHLORIDE 0.9% FLUSH: 3.0000 mL | Freq: Once | INTRAVENOUS | Status: DC

## 2023-12-04 MED ORDER — SODIUM CHLORIDE 0.9 % IV BOLUS
500.0000 mL | Freq: Once | INTRAVENOUS | Status: AC
  Administered 2023-12-04: 500 mL via INTRAVENOUS

## 2023-12-04 MED ORDER — AMOXICILLIN-POT CLAVULANATE 500-125 MG PO TABS: 1.0000 | ORAL_TABLET | Freq: Three times a day (TID) | ORAL | 0 refills | Status: AC

## 2023-12-04 NOTE — ED Notes (Signed)
 Pt ambulated with standby assist to restroom, family member in restroom with patient. Will provide urine specimen

## 2023-12-04 NOTE — ED Triage Notes (Signed)
 Noticed slurred speech yesterday at 1100- noticed by family after church. Also reports tremors and fatigue. No other weakness, deficits appreciated. Intermittent difficulty finding words today. Denies N/V/D, no urinary issues. ASA daily.

## 2023-12-04 NOTE — ED Provider Notes (Signed)
 [place] EMERGENCY DEPARTMENT AT [facility] Provider Note   CSN: [id number] Arrival date & time: 12/04/23  1640     History  No chief complaint on file.   Duane Reyes is a 73 y.o. male.  With past medical history of hypertension, GERD, prostate cancer, TIA, coronary artery disease, hyperlipidemia, hypothyroidism presenting to emergency room complaining of jittery and word finding difficulty that started approximately 6 hours ago.  Patient reports that word-finding difficulty comes and goes over the past few months and does not apparently seem worse than normal.  Family reports that he had a similar episode like this yesterday when he was trying to eat in which he had a fine tremor when he was trying to eat.  He did not have any slurred speech, weakness or change in sensation.  Denies any change in vision.  He is able to ambulate with steady gait and at this otherwise acting at normal.  Reports that metoprolol was recently increased.  Denies any other medication changes.  Denies any injury trauma or fall.  Denies chest pain, shortness of breath, abdominal pain nausea vomiting or diarrhea.  HPI     Home Medications Prior to Admission medications   Medication Sig Start Date End Date Taking? Authorizing Provider  acetaminophen (TYLENOL) 500 MG tablet Take 500 mg by mouth every 6 (six) hours as needed.    [provider]  albuterol (PROAIR HFA) 108 (90 Base) MCG/ACT inhaler Inhale 2 puffs into the lungs every 6 (six) hours as needed (for wheezing or shortness of breath). 10/07/21   Leslye Peer, MD  aspirin EC 81 MG tablet Take 1 tablet (81 mg total) by mouth daily. Swallow whole. 02/15/23   Danford, Earl Lites, MD  atorvastatin (LIPITOR) 80 MG tablet Take 1 tablet (80 mg total) by mouth daily. Patient taking differently: Take 40 mg by mouth every evening. 12/27/22   Quintella Reichert, MD  baclofen (LIORESAL) 10 MG tablet Take 10 mg by mouth as needed. 11/22/21    [provider]  budesonide-formoterol (SYMBICORT) 160-4.5 MCG/ACT inhaler Inhale 2 puffs into the lungs 2 (two) times daily as needed (flares).    [provider]  Cholecalciferol (VITAMIN D-3 PO) Take 1 capsule by mouth daily.    [provider]  clobetasol cream (TEMOVATE) 0.05 % Apply topically. 09/08/23   [provider]  Clobetasol Prop Emollient Base 0.05 % emollient cream Apply topically.    [provider]  Coenzyme Q10 (CO Q-10) 120 MG CAPS Take 120 mg by mouth daily.    [provider]  diclofenac Sodium (VOLTAREN) 1 % GEL Apply 2 g topically as needed (pain). 3-4 times daily 10/11/21   [provider]  fluticasone (FLONASE) 50 MCG/ACT nasal spray Place into the nose. 08/14/23 08/13/24  [provider]  ketorolac (ACULAR) 0.5 % ophthalmic solution Place 1 drop into both eyes in the morning and at bedtime. 01/16/23   [provider]  levothyroxine (SYNTHROID, LEVOTHROID) 125 MCG tablet Take 125 mcg by mouth daily before breakfast.    [provider]  metoprolol succinate (TOPROL-XL) 50 MG 24 hr tablet TAKE 1 TABLET EVERY DAY WITH OR IMMEDIATELY FOLLOWING A MEAL. DISCONTINUE VERAPAMIL 05/08/23   Camnitz, Andree Coss, MD  mexiletine (MEXITIL) 150 MG capsule TAKE 2 CAPSULES (300 MG TOTAL) BY MOUTH 2 (TWO) TIMES DAILY. 08/07/23   Camnitz, Andree Coss, MD  Multiple Vitamins-Minerals (PRESERVISION AREDS 2 PO) Take 1 capsule by mouth 2 (two) times daily.  [provider]  nitroGLYCERIN (NITROSTAT) 0.4 MG SL tablet Place 1 tablet (0.4 mg total) under the tongue every 5 (five) minutes as needed for chest pain (x 3 doses). 02/10/22   Camnitz, Will Daphine Deutscher, MD  pantoprazole (PROTONIX) 40 MG tablet Take 1 tablet (40 mg total) by mouth daily. 08/21/19   Camnitz, Andree Coss, MD  prednisoLONE acetate (PRED FORTE) 1 % ophthalmic suspension Place 1 drop into both eyes in the morning and at bedtime.    [provider]  sildenafil (REVATIO) 20 MG tablet Take 1-5 tablets by mouth daily as needed. 03/04/22   [provider]  traMADol (ULTRAM) 50 MG tablet Take 50 mg by mouth every 6 (six) hours as needed for moderate pain. 01/10/23   [provider]  triamcinolone ointment (KENALOG) 0.5 % Apply 1 Application topically 2 (two) times daily. Mix with CeraVe cream 1:1. 05/23/23   [provider]      Allergies    Topiramate, Celecoxib, Pitavastatin, Rosuvastatin calcium, and Hydrocodone-acetaminophen    Review of Systems   Review of Systems  Neurological:  Positive for headaches.    Physical Exam Updated Vital Signs BP (!) 149/85 (BP Location: Right Arm)   Pulse (!) 58   Temp 98.4 F (36.9 C)   Resp 16   SpO2 100%  Physical Exam  ED Results / Procedures / Treatments   Labs (all labs ordered are listed, but only abnormal results are displayed) Labs Reviewed  COMPREHENSIVE METABOLIC PANEL WITH GFR - Abnormal; Notable for the following components:      Result Value   Total Bilirubin 1.4 (*)    All other components within normal limits  PROTIME-INR  APTT  CBC  DIFFERENTIAL  ETHANOL  URINALYSIS, ROUTINE W REFLEX MICROSCOPIC  CBG MONITORING, ED    EKG EKG Interpretation Date/Time:  Monday December 04 2023 17:00:13 EDT Ventricular Rate:  58 PR Interval:  210 QRS Duration:  82 QT Interval:  402 QTC Calculation: 394 R Axis:   -48  Text Interpretation: Sinus bradycardia with 1st degree A-V block Left axis deviation Inferior infarct , age undetermined Anterolateral infarct , age undetermined Abnormal ECG When compared with ECG of 30-Jun-2023 11:11, No significant change since last tracing Confirmed by Alvira Monday ([id number]) on 12/04/2023 7:13:26 PM  Radiology CT HEAD WO CONTRAST Result Date: 12/04/2023 CLINICAL DATA:  Slurred speech. EXAM: CT HEAD WITHOUT CONTRAST TECHNIQUE: Contiguous axial images were obtained from the base of the skull through the vertex  without intravenous contrast. RADIATION DOSE REDUCTION: This exam was performed according to the departmental dose-optimization program which includes automated exposure control, adjustment of the mA and/or kV according to patient size and/or use of iterative reconstruction technique. COMPARISON:  June 30, 2023 FINDINGS: Brain: There is generalized cerebral atrophy with widening of the extra-axial spaces and ventricular dilatation. There are areas of decreased attenuation within the white matter tracts of the supratentorial brain, consistent with microvascular disease changes. 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 Vascular: No hyperdense vessel or unexpected calcification. Skull: Normal. Negative for fracture or focal lesion. Sinuses/Orbits: There is moderate severity left maxillary sinus mucosal thickening. Other: Mild left parietal scalp soft tissue swelling is noted. IMPRESSION: 1. Generalized cerebral atrophy and microvascular disease changes of the supratentorial brain. 2. 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 3. Moderate severity left maxillary sinus disease. 4. Mild left parietal scalp soft tissue swelling. Electronically Signed   By: Aram Candela M.D.   On: 12/04/2023  19:47    Procedures Procedures    Medications Ordered in ED Medications  sodium chloride flush (NS) 0.9 % injection 3 mL (has no administration in time range)    ED Course/ Medical Decision Making/ A&P Clinical Course as of 12/04/23 2028  Mon Dec 04, 2023  2023 Attending discussed case with Neurology. Neurology did not feel that symptoms were consistent with stroke. Has had similar symptoms is that past with negative stroke workup. Dose not think patient needs MRI. Feels stopping metoprolol and following up with neurology is appropriate.  [JB]  2027 Patient is feeling better and is agreeable to plan.  [JB]    Clinical Course User Index [JB] Anaston Koehn, Horald Chestnut, PA-C                                  Medical Decision Making Amount and/or Complexity of Data Reviewed Labs: ordered. Radiology: ordered.  Risk Prescription drug management.   This patient presents to the ED for concern of word finding difficulty, this involves an extensive number of treatment options, and is a complaint that carries with it a high risk of complications and morbidity.  The differential diagnosis includes CVA, dehydration, electrolyte abnormality, vertigo, medication side effect, UTI   Co morbidities that complicate the patient evaluation  Hypertension, GERD, prostate cancer, TIA, coronary artery disease, hyperlipidemia, hypothyroidism    Additional history obtained:  Additional history obtained from recent note with cardiology 10/23/2023, 06/30/23 ED visit   Lab Tests:  I personally interpreted labs.  The pertinent results include:   CBC without leukocytosis no anemia.  CMP without electrolyte abnormality.  Normal kidney function and no elevation in LFT, PT/INR normal ethanol normal CBG 87.    Imaging Studies ordered:  I ordered imaging studies including head CT   I independently visualized and interpreted imaging which showed no acute findings.  I agree with the radiologist interpretation   Cardiac Monitoring: / EKG:  The patient was maintained on a cardiac monitor.  I personally viewed and interpreted the cardiac monitored which showed an underlying rhythm of: sinus with 1st degree AV block.    Consultations Obtained:  I requested consultation with the neuro,  and discussed lab and imaging findings as well as pertinent plan - they recommend: outpatient follow up   Problem List / ED Course / Critical interventions / Medication management  Patient reporting to emergency room with complaint difficulty with word finding. He has had several episodes in the past and has been dealing with it for months however he does report that he had 2 episodes yesterday today that seemed worse  than normal and were associated with "jitteriness" and generalized feeling "unwell."  On exam he has no focal neurological deficits.  He is alert and oriented.  He is hemodynamically stable.  He denies any chest pain or abdominal pain he has been extremities without difficulty.  He has had episodes like this in the past and had negative stroke workup on 2 occasions.  Today his primary concern was episode of jitteriness in which he felt he had poor control of his fine motor movements.  Will initiate lab work and head CT as well as EKG.  Given duration of symptoms do not feel a code stroke is necessary.  Will obtain head CT and then consult neurology for further recommendations. Overall lab work is reassuring without any significant electrolyte abnormalities and hemoglobin is stable.  Head CT without  any acute findings.  Attending Dr. Dr. Dalene Seltzer contacted neurology who felt patient did not need further workup in the ED and can follow-up with neurology and cardiology outpatient. I ordered medication including NS  Reevaluation of the patient after these medicines showed that the patient improved I have reviewed the patients home medicines and have made adjustments as needed   Plan  F/u w/ PCP in 2-3d to ensure resolution of sx.  Patient was given return precautions. Patient stable for discharge at this time.  Patient educated on sx/dx and verbalized understanding of plan. Return to ER w/ new or worsening sx.          Final Clinical Impression(s) / ED Diagnoses Final diagnoses:  Tremulousness  Speech disturbance, unspecified type  Left maxillary sinusitis    Rx / DC Orders ED Discharge Orders     None         Smitty Knudsen, PA-C 12/04/23 2131    Alvira Monday, MD 12/05/23 1054

## 2023-12-04 NOTE — ED Notes (Signed)
 ED Provider at bedside.

## 2023-12-05 ENCOUNTER — Telehealth: Payer: Self-pay | Admitting: Cardiology

## 2023-12-05 NOTE — Telephone Encounter (Signed)
 Pt c/o medication issue:  1. Name of Medication:   metoprolol succinate (TOPROL-XL) 50 MG 24 hr tablet   2. How are you currently taking this medication (dosage and times per day)?   As prescribed  3. Are you having a reaction (difficulty breathing--STAT)?   4. What is your medication issue?   Patient stated he has stopped taking this medication as he stated he had a "mini stroke" and went to the ED and they recommended he stop this medication. Patient wants a call back to discuss next steps.

## 2023-12-05 NOTE — Telephone Encounter (Signed)
 Pt reports no further "jitteriness", has not taken any Metoprolol today.  Confirms he is still taking Mexiletine.  Aware forwarding to MD for his FYI.

## 2023-12-05 NOTE — Telephone Encounter (Signed)
 Spoke with patient and he states yesterday he felt jittery, his speech was off, and at a time he couldn't move his legs. He decided to go to the ED. He would like for you to review ED nites. He states they informed him he should stop metoprolol because that may be the cause of his symptoms.

## 2023-12-07 NOTE — Telephone Encounter (Signed)
 Pt girlfriend called in stating pt is with EMS now again after stopping metoprolol and asked if someone can call him about what he should do next. Please advise.

## 2023-12-07 NOTE — Telephone Encounter (Signed)
 Pt aware of MD agreeable to holding Mexiletine.  Advised to let us know next week how he is doing. Advised to call office if issue arise with his afib/HRs after holding Mexiletine & Metoprolol. Dtr agreeable to plan.

## 2023-12-07 NOTE — Telephone Encounter (Signed)
 Spoke to girlfriend, DPR on file.    She reports pt had another episode today, EMS came out and examined him but he refused to go to ED.  Advised to hold Mexiletine until hearing back from our office.  Forwarding to Dr. Elberta Fortis to see if ok to hold Mexiletine to see if symptoms resolve (what pt describes are SE more common in Mexiletine)..... Aware I will be in touch today/tomorrow w/ MD advisement.  She is agreeable to plan.  (Should pt restart Metoprolol? Should wait to see what happens after holding Mexiletine?)

## 2023-12-08 NOTE — Telephone Encounter (Signed)
 Pt would like a c/b regarding what symptoms to look out for in case they need to c/b. Please advise

## 2023-12-08 NOTE — Telephone Encounter (Signed)
 Advised to monitor his heart rates/rhythm, if HRs (after stopping Metoprolol) become elevated above 100 to call the office to discuss -- aware we may restart the Metoprolol or another rate controlling medication if needed.  He will let the office know next week or week after how he is doing after stopping Mexiletine.  He appreciates my return call.

## 2023-12-28 ENCOUNTER — Telehealth: Payer: Self-pay | Admitting: Cardiology

## 2023-12-28 NOTE — Telephone Encounter (Signed)
 Pt aware we were waiting on his call to let us  know how he did after stopping the Mexiletine and Metoprolol .  Pt states he is doing ok, "not been doing anything to majorly exert myself".  No issues but he still has some shakiness/jitteriness.  Reports he has been uptight recently, working on selling his house and been under a lot of stress.  Aware that if continued jitteriness/shakiness, then it is not related to medication and should follow up with PCP for further evaluation.   Will forward to Dr. Lawana Pray for advisement.Aaron AasAaron AasAaron AasAaron Aas  want pt to do another monitor before making decision? (Pt made aware this may be the route we go)

## 2023-12-28 NOTE — Telephone Encounter (Signed)
 Pt calling to f/u on phone note from 12/05/23. He is not taking mexiletine or Metoprolol  anymore. He asked if he needs to be taking anything else. Please advise.

## 2024-01-01 ENCOUNTER — Ambulatory Visit (INDEPENDENT_AMBULATORY_CARE_PROVIDER_SITE_OTHER): Payer: Medicare HMO

## 2024-01-01 DIAGNOSIS — G459 Transient cerebral ischemic attack, unspecified: Secondary | ICD-10-CM

## 2024-01-01 LAB — CUP PACEART REMOTE DEVICE CHECK
Date Time Interrogation Session: 20250504233204
Implantable Pulse Generator Implant Date: 20240903

## 2024-01-01 NOTE — Telephone Encounter (Signed)
 Ok to stop and see how he does, per Dr. Lawana Pray.  Pt advised to call the office if PVCs/symptoms occur and/or more frequently.  Patient verbalized understanding and agreeable to plan.

## 2024-01-08 ENCOUNTER — Encounter (INDEPENDENT_AMBULATORY_CARE_PROVIDER_SITE_OTHER): Payer: Medicare HMO | Admitting: Ophthalmology

## 2024-01-10 NOTE — Progress Notes (Signed)
 Carelink Summary Report / Loop Recorder

## 2024-01-15 ENCOUNTER — Ambulatory Visit: Payer: Medicare HMO | Admitting: Cardiology

## 2024-01-15 ENCOUNTER — Encounter (INDEPENDENT_AMBULATORY_CARE_PROVIDER_SITE_OTHER): Admitting: Ophthalmology

## 2024-01-15 DIAGNOSIS — H43813 Vitreous degeneration, bilateral: Secondary | ICD-10-CM

## 2024-01-15 DIAGNOSIS — H35373 Puckering of macula, bilateral: Secondary | ICD-10-CM

## 2024-01-15 DIAGNOSIS — H59033 Cystoid macular edema following cataract surgery, bilateral: Secondary | ICD-10-CM | POA: Diagnosis not present

## 2024-01-15 DIAGNOSIS — H353132 Nonexudative age-related macular degeneration, bilateral, intermediate dry stage: Secondary | ICD-10-CM | POA: Diagnosis not present

## 2024-01-15 DIAGNOSIS — I10 Essential (primary) hypertension: Secondary | ICD-10-CM

## 2024-01-15 DIAGNOSIS — H35033 Hypertensive retinopathy, bilateral: Secondary | ICD-10-CM

## 2024-01-16 NOTE — Progress Notes (Signed)
 [facility] [address] [place]. [place] [address] [phone]       OFFICE FOLLOW-UP NOTE  Duane Reyes Date of Birth:  09-04-1950 Medical Record Number:  [id number]    Primary neurologist: Dr. Janett Reyes Reason for visit: Transient speech disturbances, jitteriness   Chief Complaint  Patient presents with   Follow-up    Pt in room 8.alone. Here for TIA follow up. Patient reports doing okay, had hospital visit on 12/04/23. No weakness, no falls.      HPI:   Update 01/17/2024 JM: Patient returns for follow-up visit.  Was seen in the ED 12/04/2023 with complaints of word finding difficulty, noted several episodes in the past and " has been dealing with it for months" however reported 2 episodes that seemed worse than usual associated with jitteriness and generally feeling unwell.  Described jitteriness as having poor control of fine motor movements.  CT head without acute abnormality.  Lab work benign.  Neurology evaluated and did not feel further workup indicated as prior workups largely unremarkable for similar symptoms.  He was advised to hold metoprolol  and discuss further with cardiology.  Noted recurrent episode on 4/10, EMS available but refused ED evaluation, cardiology recommended holding Mexiletine to see if symptoms improve.   He has not had any reoccurrence since that time.  He does occasionally feel jittery but nothing significant.  No new stroke/TIA symptoms.  He does endorse increased stressors over the past couple months while trying to sell his in-laws house which required going through the belongings and cleaning it out.  He questions if increased stress could have contributed.  He also mentions having low heart rate since February.  Routinely monitors blood pressure and heart rate at home and remain on the lower side.  He has not yet had follow-up visit with cardiology, does not have any upcoming visits.  ILR routinely monitored without any recent  evidence of arrhythmias.  He does report compliance on aspirin  and atorvastatin .  Routinely follows with PCP for stroke risk factor management.      History provided for reference purposes only Update 07/10/2023 Dr. Janett Reyes: He returns for follow-up after last visit 4 months ago.  He is referred back to me from the emergency room where he visited on 06/30/2023.  He states he was sitting in the chair while all of a sudden he noticed increased jitteriness and tremulousness of his hands.  He had trouble cutting food and holding utensils because of this.  His wife noticed that he had some trouble finding words for few seconds.  He was all right after that and then few minutes later he had some transient speech difficulty again.  He was seen in the ER where he was not felt to be have a TIA.  CT head was done which showed no acute abnormality.  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  Complete metabolic panel labs and were normal.  Patient was asked to see me.  He states he has had intermittent tremors for some time.  His tremors have possibly felt related to gabapentin  which she has been taking 305 times daily.  He was asked to taper it and he has done so and is currently only on 1 tablet.  Is not noticed any significant worsening of his back pain and his hand tremors appear to have improved.  He remains on aspirin  which he is tolerating  well with minor bruising and no bleeding.  Patient had no recurrent TIA or stroke symptoms.  He is tolerating Lipitor well without muscle aches and pains.  Initial visit 02/23/2023 Dr. Janett Reyes: Duane Reyes is a 73 year old male seen today for office follow-up visit following hospital for TIA.  History is obtained from the patient and review of electronic medical records.  I personally reviewed pertinent available imaging films in PACS.Duane Reyes is a 73 y.o. male with PMH  significant for CHF, GERD, HTN, HLD, hypothyroidism, PVCs, GERD, chronic DOE, who presented on 02/13/2023 with transient episode of word finding difficulty.  He got up that morning fine.  He went outside and tried to [place] the lawn.  He was outside for an hour he felt tired and came back and sat down.  He called his girlfriend was having trouble getting his words out.  He had to struggle to get the words out and they were slurred and at times he ended up using wrong words.  He denied headache or other focal neurological symptoms.  He was fully aware of his surroundings .  He was not confused.  Words appeared to be quite slurred .the episode appeared to be over by the time his girlfriend came over in 35 minutes He felt episode lasted about 20 minutes.  He denies any prior history of strokes TIAs seizures or significant neurological problems.  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  CT angiogram of the brain and neck showed no large vessel stenosis or occlusion.  EEG was normal without any seizure activity.  Echocardiogram showed ejection fraction of 45 to 50%.  Left atrial size was normal.  Hemoglobin A1c was 5.3.  LDL cholesterol was 60 mg percent.  Ammonia level and TSH were normal.  Patient states is done well since discharge..No further recurrent TIA or strokelike episodes.Duane Reyes  He is tolerating aspirin  and Plavix  well but does bruise easily.  He is tolerating Lipitor well without muscle aches and pains.  States his blood pressure is under good control.  He denies any history of atrial fibrillation but does admit to having palpitations and  ectopics in the past.  He has never had cardiac monitoring.       ROS:   14 system review of systems is positive for those listed in HPI and all other systems negative  PMH:  Past Medical History:  Diagnosis Date   Arthritis    "left knee" (01/29/2016)   Bradycardia, drug induced 10/26/2016    Bronchiolitis    CAD in native artery    a. Abnl cardiac CT ; b. LHC 01/2016 s/p 50% mLAD, 25% OM2, 95% RPDA which was treated with overlapping DES; c. MV 03/2021 negative for ischemia   Chronic combined systolic and diastolic heart failure (HCC)    HFmrEF, echo 03/2021: EF 45-50%, GIDD.   Chronic knee pain    Chronic lower back pain    DOE (dyspnea on exertion)    Dyspnea    Frequent PVCs    a. 12/2015 - Holter showed SB, NSR, ST, HR 54-108, with frequent PVCs in singles, couplets, and bigemy, with elevated PVC load 24%.   GERD (gastroesophageal reflux disease)    History of shingles    Hyperlipidemia    Hypertension    Hypothyroidism    OSA (obstructive sleep apnea)    mild obstructive sleep apnea with an AHI of 6.1/h with all events  occurring in the supine position   Prostate cancer [facility])    Scoliosis    Spinal stenosis    Spondylolisthesis of lumbar region    TIA (transient ischemic attack) 01/2023   the Monday after Father's Day in 2024- taken off of PLAVIX  on 03/06/2023- added 81 mg ASA daily   Tortuosity of aortic arch    Tortuosity of aortic arch, -would not pursue Rt radial approach in this patient for future cardiac caths     Social History:  Social History   Socioeconomic History   Marital status: Married    Spouse name: Not on file   Number of children: Not on file   Years of education: Not on file   Highest education level: Not on file  Occupational History   Not on file  Tobacco Use   Smoking status: Never   Smokeless tobacco: Never  Vaping Use   Vaping status: Never Used  Substance and Sexual Activity   Alcohol  use: Not Currently    Comment: 01/29/2016 "glass of wine q couple months"   Drug use: No   Sexual activity: Not Currently  Other Topics Concern   Not on file  Social History Narrative   Not on file   Social Drivers of Health   Financial Resource Strain: Low Risk  (05/23/2023)   Received from [facility]   Overall Financial Resource Strain  (CARDIA)    Difficulty of Paying Living Expenses: Not hard at all  Food Insecurity: Low Risk  (11/16/2023)   Received from [facility]   Hunger Vital Sign    Worried About Running Out of Food in the Last Year: Never true    Ran Out of Food in the Last Year: Never true  Transportation Needs: No Transportation Needs (11/16/2023)   Received from [facility]    In the past 12 months, has lack of reliable transportation kept you from medical appointments, meetings, work or from getting things needed for daily living? : No  Physical Activity: Insufficiently Active (05/23/2023)   Received from [facility]   Exercise Vital Sign    Days of Exercise per Week: 3 days    Minutes of Exercise per Session: 30 min  Stress: No Stress Concern Present (05/23/2023)   Received from [facility] - Occupational Stress Questionnaire    Feeling of Stress : Not at all  Social Connections: Socially Integrated (05/23/2023)   Received from [facility]   Social Network    How would you rate your social network (family, work, friends)?: Good participation with social networks  Intimate Partner Violence: Not At Risk (05/23/2023)   Received from [facility]   HITS    Over the last 12 months how often did your partner physically hurt you?: Never    Over the last 12 months how often did your partner insult you or talk down to you?: Never    Over the last 12 months how often did your partner threaten you with physical harm?: Never    Over the last 12 months how often did your partner scream or curse at you?: Never    Medications:   Current Outpatient Medications on File Prior to Visit  Medication Sig Dispense Refill   acetaminophen  (TYLENOL ) 500 MG tablet Take 500 mg by mouth every 6 (six) hours as needed.     albuterol  (PROAIR  HFA) 108 (90 Base) MCG/ACT inhaler Inhale 2 puffs into the lungs every 6 (six)  hours as needed (for wheezing or shortness of  breath). 18 g 6   aspirin  EC 81 MG tablet Take 1 tablet (81 mg total) by mouth daily. Swallow whole. 30 tablet 12   atorvastatin  (LIPITOR) 80 MG tablet Take 1 tablet (80 mg total) by mouth daily. (Patient taking differently: Take 40 mg by mouth every evening.) 90 tablet 3   baclofen (LIORESAL) 10 MG tablet Take 10 mg by mouth as needed.     budesonide-formoterol (SYMBICORT) 160-4.5 MCG/ACT inhaler Inhale 2 puffs into the lungs 2 (two) times daily as needed (flares).     Cholecalciferol (VITAMIN D-3 PO) Take 1 capsule by mouth daily.     clobetasol cream (TEMOVATE) 0.05 % Apply topically.     Coenzyme Q10 (CO Q-10) 120 MG CAPS Take 120 mg by mouth daily.     diclofenac Sodium (VOLTAREN) 1 % GEL Apply 2 g topically as needed (pain). 3-4 times daily     fluticasone  (FLONASE) 50 MCG/ACT nasal spray Place into the nose.     ketorolac  (ACULAR ) 0.5 % ophthalmic solution Place 1 drop into both eyes in the morning and at bedtime.     levothyroxine  (SYNTHROID , LEVOTHROID) 125 MCG tablet Take 125 mcg by mouth daily before breakfast.     Multiple Vitamins-Minerals (PRESERVISION AREDS 2 PO) Take 1 capsule by mouth 2 (two) times daily.     nitroGLYCERIN  (NITROSTAT ) 0.4 MG SL tablet Place 1 tablet (0.4 mg total) under the tongue every 5 (five) minutes as needed for chest pain (x 3 doses). 25 tablet 0   pantoprazole  (PROTONIX ) 40 MG tablet Take 1 tablet (40 mg total) by mouth daily. 90 tablet 3   prednisoLONE  acetate (PRED FORTE ) 1 % ophthalmic suspension Place 1 drop into both eyes in the morning and at bedtime.     sildenafil (REVATIO) 20 MG tablet Take 1-5 tablets by mouth daily as needed.     traMADol (ULTRAM) 50 MG tablet Take 50 mg by mouth every 6 (six) hours as needed for moderate pain.     triamcinolone  ointment (KENALOG ) 0.5 % Apply 1 Application topically 2 (two) times daily. Mix with CeraVe cream 1:1.     Clobetasol Prop Emollient Base 0.05 % emollient cream Apply topically.     No current  facility-administered medications on file prior to visit.    Allergies:   Allergies  Allergen Reactions   Topiramate Anxiety, Palpitations and Other (See Comments)    blurred vision, tired, ringing in ears, restlessness, confusion, rapid heart rate, made him jittery   Celecoxib Other (See Comments)    GI Upset; did not mix with his Nexium/Caused acid relux     Pitavastatin Rash and Cough    LIVALO    Rosuvastatin Calcium  Other (See Comments)    Muscle aches    Hydrocodone -Acetaminophen  Other (See Comments)    Delusions     Physical Exam Today's Vitals   01/17/24 1340  BP: 115/60  Pulse: (!) 50  SpO2: 96%  Weight: 164 lb 6.4 oz (74.6 kg)  Height: 5\' 7"  (1.702 m)   Body mass index is 25.75 kg/m.  General: well developed, well nourished, very pleasant elderly Caucasian male, seated, in no evident distress Head: head normocephalic and atraumatic.  Neck: supple with no carotid or supraclavicular bruits Cardiovascular: regular rate and rhythm, no murmurs Musculoskeletal: no deformity Skin:  no rash/petichiae Vascular:  Normal pulses all extremities  Neurologic Exam Mental Status: Awake and fully alert. Oriented to place and time. Recent and  remote memory intact. Attention span, concentration and fund of knowledge appropriate. Mood and affect appropriate.  Speech is clear without aphasia or dysarthria.  Good naming, repetition and comprehension. Cranial Nerves: Pupils equal, briskly reactive to light. Extraocular movements full without nystagmus. Visual fields full to confrontation. Hearing intact. Facial sensation intact. Face, tongue, palate moves normally and symmetrically.  Motor: Normal bulk and tone. Normal strength in all tested extremity muscles.  No evidence of action or resting tremor on exam. Sensory.: intact to touch ,pinprick .position and vibratory sensation.  Coordination: Rapid alternating movements normal in all extremities. Finger-to-nose and heel-to-shin  performed accurately bilaterally. Gait and Station: Arises from chair without difficulty. Stance is normal. Gait demonstrates normal stride length and balance without use of AD.  Reflexes: 1+ and symmetric. Toes downgoing.       ASSESSMENT: 73 year old male with transient episode of expressive aphasia in 01/2023 suspected left hemispheric TIA.  Vascular risk factors of coronary artery disease, hypertension, hyperlipidemia.  New onset episode of transient tremors and speech disturbance in November 2024 unlikely to be TIA and likely medication effect of gabapentin .  Recurrent episode of tremor and speech disturbance 12/04/2023 possibly medication side effect (metoprolol  and mexiletine held by cardiology). No reoccurrence since that time.      PLAN:  1.  Transient speech disturbance and jitteriness/tremor  - recurrent episodes, question medication side effect vs psychogenic  - remains bradycardic since 09/2023 - advised f/u with cardiology for further discussion  - ILR monitored without evidence of abnormal arrhythmias  - EEG 01/2023 normal  - Thyroid  panel 06/2023 normal, remains on Synthroid  per PCP - [facility] 11/2023 no acute abnormality, chronic left thalamic infarct  - MRI brain 01/2023 and 06/2023 no acute abnormality, bilateral thalamic infarcts  2. TIA  - continue aspirin  and atorvastatin  for secondary stroke prevention manner/prescribed with PCP  - Continue routine follow-up with PCP for stroke risk factor management    No further recommendations from neurological standpoint as extensive workup completed which has been largely benign.  He was encouraged to continue to follow with PCP and cardiology.  He was advised to call with any questions or concerns in the future.      I spent 40 minutes of face-to-face and non-face-to-face time with patient. This is our first time meeting and time has been spent reviewing past medical history and relevant medical records.  This included previsit  chart review including review of hospitalization, lab review, study review, order entry, electronic health record documentation, patient education and discussion regarding above diagnoses and treatment plan and answered all other questions to patient's satisfaction  Duane Reyes, [facility]  [facility] [address] [place], [place] [address]  Phone [phone] Fax [phone] Note: This document was prepared with digital dictation and possible smart phrase technology. Any transcriptional errors that result from this process are unintentional.

## 2024-01-17 ENCOUNTER — Encounter: Payer: Self-pay | Admitting: Adult Health

## 2024-01-17 ENCOUNTER — Ambulatory Visit (INDEPENDENT_AMBULATORY_CARE_PROVIDER_SITE_OTHER): Payer: Medicare HMO | Admitting: Adult Health

## 2024-01-17 VITALS — BP 115/60 | HR 50 | Ht 67.0 in | Wt 164.4 lb

## 2024-01-17 DIAGNOSIS — R4789 Other speech disturbances: Secondary | ICD-10-CM

## 2024-01-17 DIAGNOSIS — R251 Tremor, unspecified: Secondary | ICD-10-CM | POA: Diagnosis not present

## 2024-01-17 DIAGNOSIS — G459 Transient cerebral ischemic attack, unspecified: Secondary | ICD-10-CM | POA: Diagnosis not present

## 2024-01-17 NOTE — Patient Instructions (Addendum)
 Your Plan:  Please schedule follow up visit with cardiology to further discuss bradycardia and medications  Keep track of your blood pressure and heart rate at home with a log. Also check your blood pressure and heart rate when not feeling well or when feeling jittery     Can follow up with us  as needed for now but please call with any questions or concerns in the future     Thank you for coming to see us  at [facility]. I hope we have been able to provide you high quality care today.  You may receive a patient satisfaction survey over the next few weeks. We would appreciate your feedback and comments so that we may continue to improve ourselves and the health of our patients.

## 2024-02-01 ENCOUNTER — Ambulatory Visit (INDEPENDENT_AMBULATORY_CARE_PROVIDER_SITE_OTHER)

## 2024-02-01 DIAGNOSIS — G459 Transient cerebral ischemic attack, unspecified: Secondary | ICD-10-CM

## 2024-02-01 LAB — CUP PACEART REMOTE DEVICE CHECK
Date Time Interrogation Session: 20250604233402
Implantable Pulse Generator Implant Date: 20240903

## 2024-02-12 ENCOUNTER — Ambulatory Visit: Payer: Self-pay | Admitting: Cardiology

## 2024-02-13 ENCOUNTER — Encounter: Payer: Self-pay | Admitting: Pulmonary Disease

## 2024-02-13 ENCOUNTER — Ambulatory Visit: Attending: Pulmonary Disease | Admitting: Pulmonary Disease

## 2024-02-13 ENCOUNTER — Telehealth: Payer: Self-pay | Admitting: Cardiology

## 2024-02-13 VITALS — BP 135/56 | HR 78 | Ht 67.0 in | Wt 162.8 lb

## 2024-02-13 DIAGNOSIS — R001 Bradycardia, unspecified: Secondary | ICD-10-CM | POA: Diagnosis not present

## 2024-02-13 DIAGNOSIS — I10 Essential (primary) hypertension: Secondary | ICD-10-CM | POA: Diagnosis not present

## 2024-02-13 DIAGNOSIS — I2510 Atherosclerotic heart disease of native coronary artery without angina pectoris: Secondary | ICD-10-CM | POA: Diagnosis not present

## 2024-02-13 DIAGNOSIS — I493 Ventricular premature depolarization: Secondary | ICD-10-CM

## 2024-02-13 MED ORDER — METOPROLOL SUCCINATE ER 25 MG PO TB24: 25.0000 mg | ORAL_TABLET | Freq: Every day | ORAL | 1 refills | Status: AC

## 2024-02-13 MED ORDER — MEXILETINE HCL 150 MG PO CAPS: 150.0000 mg | ORAL_CAPSULE | Freq: Two times a day (BID) | ORAL | 1 refills | Status: AC

## 2024-02-13 NOTE — Patient Instructions (Signed)
 Medication Instructions:  1.Start mexiletine 150 mg twice daily 2.Start metoprolol  succinate (Toprol  XL) 25 mg daily *If you need a refill on your cardiac medications before your next appointment, please call your pharmacy*  Lab Work: None ordered If you have labs (blood work) drawn today and your tests are completely normal, you will receive your results only by: MyChart Message (if you have MyChart) OR A paper copy in the mail If you have any lab test that is abnormal or we need to change your treatment, we will call you to review the results.  Follow-Up: At [facility], you and your health needs are our priority.  As part of our continuing mission to provide you with exceptional heart care, our providers are all part of one team.  This team includes your primary Cardiologist (physician) and Advanced Practice Providers or APPs (Physician Assistants and Nurse Practitioners) who all work together to provide you with the care you need, when you need it.  Your next appointment:   Next available to discuss PV ablation  Provider:   Agatha Horsfall, MD

## 2024-02-13 NOTE — Progress Notes (Signed)
 Electrophysiology Office Note:   Date:  02/13/2024  ID:  [name], DOB 08/18/1951, MRN [id number]  Primary Cardiologist: Gaylyn Keas, MD Primary Heart Failure: None Electrophysiologist: Will Cortland Ding, MD      History of Present Illness:   Duane Reyes is a 73 y.o. male with h/o PVC's, TIA 01/2023, suspected OSA  seen today for acute visit due to concern for bradycardia post back injection.    Seen in EP Clinic 10/23/23 after being told his HR was low on pulse oximeter while at his eye doctor. PVC burden was 11.6% at that time on ILR.   Patient reports he had a back injection today and was told his HR was low.  He was told to go to his cardiologist.  He is asymptomatic otherwise.  The patient called the clinic in April 2025 with reports of jitteriness and shakiness.  At that time his metoprolol  and mexiletine were stopped.  He was previously on 300 mg mexiletine twice daily and 50 mg Toprol .  He tolerated the medications well otherwise and had been on them for quite some time.  He denies chest pain, palpitations, dyspnea, PND, orthopnea, nausea, vomiting, dizziness, syncope, edema, weight gain, or early satiety.   Review of systems complete and found to be negative unless listed in HPI.   EP Information / Studies Reviewed:    EKG is ordered today. Personal review as below.  EKG Interpretation Date/Time:  Tuesday February 13 2024 15:55:41 EDT Ventricular Rate:  78 PR Interval:  174 QRS Duration:  94 QT Interval:  386 QTC Calculation: 440 R Axis:   -34  Text Interpretation: Sinus rhythm with frequent Premature ventricular complexes in a pattern of bigeminy Left axis deviation Incomplete right bundle branch block Confirmed by Creighton Doffing ([id number]) on 02/13/2024 4:56:37 PM   Studies:  Cardiac Monitor 2018 > 46% PVC burden  Cardiac Monitor 05/2020 > 3.6% PVC's, predominantly NSR  Cardiac Montior 12/2021 > 22.8% PVC burden  ECHO 01/2023 > LVEF 45-50%, G1DD   Arrhythmia /  AAD PVC's   Mexiletine + Metoprolol    Device:  Medtronic ILR, 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    Risk Assessment/Calculations:              Physical Exam:   VS:  BP (!) 135/56   Pulse 78   Ht 5' 7 (1.702 m)   Wt 162 lb 12.8 oz (73.8 kg)   SpO2 97%   BMI 25.50 kg/m    Wt Readings from Last 3 Encounters:  02/13/24 162 lb 12.8 oz (73.8 kg)  01/17/24 164 lb 6.4 oz (74.6 kg)  12/04/23 167 lb (75.8 kg)     GEN: Well nourished, well developed in no acute distress NECK: No JVD; No carotid bruits CARDIAC: Regular rate and rhythm, bigeminy noted, no murmurs, rubs, gallops RESPIRATORY:  Clear to auscultation without rales, wheezing or rhonchi  ABDOMEN: Soft, non-tender, non-distended EXTREMITIES:  No edema; No deformity   ASSESSMENT AND PLAN:    PVC's  Bradycardia  PVC burden in 2018 46%, Intolerant of CCB / BB in past due to fatigue. Hx of waxing / waning % burden of PVC's  -ILR review 02/13/24 shows 25.6% burden PVC's, no brady episodes / no AF -previously on mexiletine 300 mg + Toprol  XL 50 mg daily   -discussed AAD options with patient for PVCs, we will reserve amiodarone for now.  He is not a candidate for CCB given CAD, nor flecainide. -fortunately he is largely asymptomatic from  PVCs -can add magnesium taurate ~ 500 mg daily  -resume mexiletine at 150 mg twice daily and Toprol  25 mg daily -will have patient return to talk with Dr. Lawana Pray regarding PVC ablation's appear to be unifocal PVC -if he is not an ablation candidate or structurally amenable PVC then consider amiodarone -again discussed the concept of bradysphygmia with the patient and encouraged him not to be concerned with heart rates that are registered on an oximeter  CAD s/p PCI -no anginal symptoms   Hypertension  -well controlled on current regimen    Hx TIA  01/2023, no clear cause identified  -no AF on review   Follow up with Dr. Lawana Pray at next available appointment   Signed, Creighton Doffing, NP-C, AGACNP-BC [facility] - Electrophysiology  02/13/2024, 5:01 PM

## 2024-02-13 NOTE — Telephone Encounter (Signed)
 STAT if HR is under 50 or over 120  (normal HR is 60-100 beats per minute)  What is your heart rate? 40 - 15 minutes ago   Do you have a log of your heart rate readings (document readings)?  38-45 for few weeks   Do you have any other symptoms? No

## 2024-02-13 NOTE — Telephone Encounter (Signed)
 Spoke to patient he stated he has been having slow heart rate.Stated he had back injection this morning and heart rate 40.He was told he needed to call cardiologist.His PCP advised him to see cardiologist too.Advised Creighton Doffing NP has had a cancellation this afternoon.Appointment scheduled with her at 3:35 pm.

## 2024-02-19 ENCOUNTER — Encounter: Payer: Self-pay | Admitting: Cardiology

## 2024-02-19 ENCOUNTER — Ambulatory Visit: Attending: Cardiology | Admitting: Cardiology

## 2024-02-19 VITALS — BP 122/82 | HR 85 | Ht 67.0 in | Wt 161.0 lb

## 2024-02-19 DIAGNOSIS — I2510 Atherosclerotic heart disease of native coronary artery without angina pectoris: Secondary | ICD-10-CM

## 2024-02-19 DIAGNOSIS — G459 Transient cerebral ischemic attack, unspecified: Secondary | ICD-10-CM | POA: Diagnosis not present

## 2024-02-19 DIAGNOSIS — I10 Essential (primary) hypertension: Secondary | ICD-10-CM

## 2024-02-19 DIAGNOSIS — I493 Ventricular premature depolarization: Secondary | ICD-10-CM

## 2024-02-19 NOTE — Progress Notes (Signed)
   Electrophysiology Office Note:   Date:  02/19/2024  ID:  Duane Reyes, DOB 1951/05/22, MRN [id number]  Primary Cardiologist: Wilbert Bihari, MD Primary Heart Failure: None Electrophysiologist: Gemma Ruan Gladis Norton, MD      History of Present Illness:   Duane Reyes is a 73 y.o. male with h/o PVCs, coronary artery disease, hypertension, hypothyroidism, TIA seen today for routine electrophysiology followup.   Since last being seen in our clinic the patient reports doing overall well.  His palpitation burden has gone down.  He restarted a lower dose of mexiletine approximately 1 week ago.  At that time, he was having significant palpitations.  This was confirmed on his ILR.  Since he has restarted his mexiletine, his palpitations have improved and his PVC burden based on ILR recordings have improved as well.  he denies chest pain, palpitations, dyspnea, PND, orthopnea, nausea, vomiting, dizziness, syncope, edema, weight gain, or early satiety.   Review of systems complete and found to be negative unless listed in HPI.   Device History: Medtronic loop recorder implanted 05/02/2019 for for TIA  EP Information / Studies Reviewed:    EKG is not ordered today. EKG from 02/13/2024 reviewed which showed sinus rhythm, ventricular bigeminy     Risk Assessment/Calculations:            Physical Exam:   VS:  There were no vitals taken for this visit.   Wt Readings from Last 3 Encounters:  02/13/24 162 lb 12.8 oz (73.8 kg)  01/17/24 164 lb 6.4 oz (74.6 kg)  12/04/23 167 lb (75.8 kg)     GEN: Well nourished, well developed in no acute distress NECK: No JVD; No carotid bruits CARDIAC: Regular rate and rhythm, no murmurs, rubs, gallops RESPIRATORY:  Clear to auscultation without rales, wheezing or rhonchi  ABDOMEN: Soft, non-tender, non-distended EXTREMITIES:  No edema; No deformity   ILR Interrogation- reviewed in detail today,  See PACEART report  ASSESSMENT AND PLAN:    TIA s/p  Medtronic Loop recorder Normal device function See Pace Art report No changes today  2.  PVCs: On mexiletine.  He previously stopped his mexiletine with an increased burden of palpitations.  Mexiletine has been restarted is felt better.  No changes.  Kodie Pick continue to monitor burden via ILR.  3.  Coronary artery disease: Post RCA stent.  No current chest pain.  Plan per primary cardiology.  4.  Hypertension: Well-controlled     Follow up with EP APP in 12 months  Signed, Jeannie Mallinger Gladis Norton, MD

## 2024-02-19 NOTE — Progress Notes (Signed)
 Carelink Summary Report / Loop Recorder

## 2024-03-04 ENCOUNTER — Ambulatory Visit

## 2024-03-04 DIAGNOSIS — G459 Transient cerebral ischemic attack, unspecified: Secondary | ICD-10-CM | POA: Diagnosis not present

## 2024-03-05 LAB — CUP PACEART REMOTE DEVICE CHECK
Date Time Interrogation Session: 20250706233846
Implantable Pulse Generator Implant Date: 20240903

## 2024-03-19 NOTE — Progress Notes (Signed)
 Carelink Summary Report / Loop Recorder

## 2024-03-30 ENCOUNTER — Ambulatory Visit: Payer: Self-pay | Admitting: Cardiology

## 2024-04-04 ENCOUNTER — Ambulatory Visit

## 2024-04-04 ENCOUNTER — Ambulatory Visit: Payer: Self-pay | Admitting: Cardiology

## 2024-04-04 DIAGNOSIS — G459 Transient cerebral ischemic attack, unspecified: Secondary | ICD-10-CM | POA: Diagnosis not present

## 2024-04-04 LAB — CUP PACEART REMOTE DEVICE CHECK
Date Time Interrogation Session: 20250806233357
Implantable Pulse Generator Implant Date: 20240903

## 2024-05-06 ENCOUNTER — Ambulatory Visit

## 2024-05-06 DIAGNOSIS — G459 Transient cerebral ischemic attack, unspecified: Secondary | ICD-10-CM | POA: Diagnosis not present

## 2024-05-06 LAB — CUP PACEART REMOTE DEVICE CHECK
Date Time Interrogation Session: 20250906232146
Implantable Pulse Generator Implant Date: 20240903

## 2024-05-07 ENCOUNTER — Ambulatory Visit: Payer: Self-pay | Admitting: Cardiology

## 2024-05-16 ENCOUNTER — Encounter

## 2024-05-16 NOTE — Progress Notes (Signed)
 Remote Loop Recorder Transmission

## 2024-05-20 ENCOUNTER — Encounter (INDEPENDENT_AMBULATORY_CARE_PROVIDER_SITE_OTHER): Admitting: Ophthalmology

## 2024-05-20 DIAGNOSIS — I10 Essential (primary) hypertension: Secondary | ICD-10-CM

## 2024-05-20 DIAGNOSIS — H35033 Hypertensive retinopathy, bilateral: Secondary | ICD-10-CM | POA: Diagnosis not present

## 2024-05-20 DIAGNOSIS — H59033 Cystoid macular edema following cataract surgery, bilateral: Secondary | ICD-10-CM | POA: Diagnosis not present

## 2024-05-20 DIAGNOSIS — H43813 Vitreous degeneration, bilateral: Secondary | ICD-10-CM

## 2024-05-20 DIAGNOSIS — H353132 Nonexudative age-related macular degeneration, bilateral, intermediate dry stage: Secondary | ICD-10-CM

## 2024-05-20 DIAGNOSIS — H35372 Puckering of macula, left eye: Secondary | ICD-10-CM

## 2024-05-24 NOTE — Progress Notes (Signed)
 Remote Loop Recorder Transmission

## 2024-06-05 ENCOUNTER — Ambulatory Visit (INDEPENDENT_AMBULATORY_CARE_PROVIDER_SITE_OTHER)

## 2024-06-05 DIAGNOSIS — G459 Transient cerebral ischemic attack, unspecified: Secondary | ICD-10-CM | POA: Diagnosis not present

## 2024-06-06 ENCOUNTER — Encounter

## 2024-06-06 LAB — CUP PACEART REMOTE DEVICE CHECK
Date Time Interrogation Session: 20251007233102
Implantable Pulse Generator Implant Date: 20240903

## 2024-06-07 ENCOUNTER — Ambulatory Visit: Payer: Self-pay | Admitting: Cardiology

## 2024-06-10 NOTE — Progress Notes (Signed)
 Remote Loop Recorder Transmission

## 2024-06-12 ENCOUNTER — Encounter (HOSPITAL_BASED_OUTPATIENT_CLINIC_OR_DEPARTMENT_OTHER): Payer: Self-pay | Admitting: Emergency Medicine

## 2024-06-12 ENCOUNTER — Emergency Department (HOSPITAL_BASED_OUTPATIENT_CLINIC_OR_DEPARTMENT_OTHER): Admitting: Radiology

## 2024-06-12 ENCOUNTER — Emergency Department (HOSPITAL_BASED_OUTPATIENT_CLINIC_OR_DEPARTMENT_OTHER)
Admission: EM | Admit: 2024-06-12 | Discharge: 2024-06-12 | Disposition: A | Discharge status: Home / Self Care | Attending: Emergency Medicine | Admitting: Emergency Medicine

## 2024-06-12 ENCOUNTER — Emergency Department (HOSPITAL_BASED_OUTPATIENT_CLINIC_OR_DEPARTMENT_OTHER)

## 2024-06-12 ENCOUNTER — Other Ambulatory Visit: Payer: Self-pay

## 2024-06-12 DIAGNOSIS — Z79899 Other long term (current) drug therapy: Secondary | ICD-10-CM | POA: Diagnosis not present

## 2024-06-12 DIAGNOSIS — R059 Cough, unspecified: Secondary | ICD-10-CM | POA: Diagnosis present

## 2024-06-12 DIAGNOSIS — J0191 Acute recurrent sinusitis, unspecified: Secondary | ICD-10-CM | POA: Diagnosis not present

## 2024-06-12 DIAGNOSIS — R918 Other nonspecific abnormal finding of lung field: Secondary | ICD-10-CM | POA: Diagnosis not present

## 2024-06-12 DIAGNOSIS — K449 Diaphragmatic hernia without obstruction or gangrene: Secondary | ICD-10-CM | POA: Insufficient documentation

## 2024-06-12 DIAGNOSIS — I10 Essential (primary) hypertension: Secondary | ICD-10-CM | POA: Insufficient documentation

## 2024-06-12 DIAGNOSIS — I700 Atherosclerosis of aorta: Secondary | ICD-10-CM | POA: Insufficient documentation

## 2024-06-12 DIAGNOSIS — Z7982 Long term (current) use of aspirin: Secondary | ICD-10-CM | POA: Diagnosis not present

## 2024-06-12 DIAGNOSIS — R001 Bradycardia, unspecified: Secondary | ICD-10-CM | POA: Diagnosis not present

## 2024-06-12 LAB — RESP PANEL BY RT-PCR (RSV, FLU A&B, COVID)  RVPGX2
Influenza A by PCR: NEGATIVE
Influenza B by PCR: NEGATIVE
Resp Syncytial Virus by PCR: NEGATIVE
SARS Coronavirus 2 by RT PCR: NEGATIVE

## 2024-06-12 MED ORDER — AZITHROMYCIN 250 MG PO TABS: 250.0000 mg | ORAL_TABLET | Freq: Every day | ORAL | 0 refills | Status: AC

## 2024-06-12 MED ORDER — PREDNISONE 20 MG PO TABS: 20.0000 mg | ORAL_TABLET | Freq: Every day | ORAL | 0 refills | Status: AC

## 2024-06-12 NOTE — ED Notes (Signed)
 Reviewed discharge instructions, medications, and home care with pt. Pt verbalized understanding and had no further questions. Pt exited ED without complications.

## 2024-06-12 NOTE — ED Notes (Signed)
 Patient transported to CT

## 2024-06-12 NOTE — ED Notes (Signed)
 Pt's HR during triage was at 33; this nurse checked manually and got 36. EKG performed as HR was at 63. Will continue to monitor.

## 2024-06-12 NOTE — ED Provider Notes (Signed)
 [place] EMERGENCY DEPARTMENT AT [facility] Provider Note   CSN: [id number] Arrival date & time: 06/12/24  1104     Patient presents with: Nasal Congestion   Duane Reyes is a 73 y.o. male.   73 year old male with a history of PVCs, hypertension, hyperlipidemia, and sinusitis who presents to the emergency department with nasal congestion and cough.  Patient reports that started yesterday but his significant other is in the room and says it has been going on for a week.  Is been trying Mucinex without any relief.  No fevers or chills.  Was noted to be bradycardic in triage but denies any chest pain, palpitations, shortness of breath, lightheadedness, or syncope.       Prior to Admission medications   Medication Sig Start Date End Date Taking? Authorizing Provider  azithromycin (ZITHROMAX) 250 MG tablet Take 1 tablet (250 mg total) by mouth daily. Take first 2 tablets together, then 1 every day until finished. 06/12/24  Yes Yolande Lamar JAYSON, MD  predniSONE (DELTASONE) 20 MG tablet Take 1 tablet (20 mg total) by mouth daily for 5 days. 06/12/24 06/17/24 Yes Yolande Lamar JAYSON, MD  acetaminophen  (TYLENOL ) 500 MG tablet Take 500 mg by mouth every 6 (six) hours as needed.    [provider]  albuterol  (PROAIR  HFA) 108 (90 Base) MCG/ACT inhaler Inhale 2 puffs into the lungs every 6 (six) hours as needed (for wheezing or shortness of breath). 10/07/21   Shelah Lamar RAMAN, MD  aspirin  EC 81 MG tablet Take 1 tablet (81 mg total) by mouth daily. Swallow whole. 02/15/23   Danford, Lonni SQUIBB, MD  atorvastatin  (LIPITOR) 80 MG tablet Take 1 tablet (80 mg total) by mouth daily. 12/27/22   Shlomo Wilbert SAUNDERS, MD  baclofen (LIORESAL) 10 MG tablet Take 10 mg by mouth as needed. 11/22/21   [provider]  budesonide-formoterol (SYMBICORT) 160-4.5 MCG/ACT inhaler Inhale 2 puffs into the lungs 2 (two) times daily as needed (flares).    [provider]  Cholecalciferol  (VITAMIN D-3 PO) Take 1 capsule by mouth daily.    [provider]  clobetasol cream (TEMOVATE) 0.05 % Apply topically. 09/08/23   [provider]  Coenzyme Q10 (CO Q-10) 120 MG CAPS Take 120 mg by mouth daily.    [provider]  diclofenac Sodium (VOLTAREN) 1 % GEL Apply 2 g topically as needed (pain). 3-4 times daily 10/11/21   [provider]  fluticasone  (FLONASE) 50 MCG/ACT nasal spray Place into the nose. 08/14/23 08/13/24  [provider]  ketorolac  (ACULAR ) 0.5 % ophthalmic solution Place 1 drop into both eyes in the morning and at bedtime. 01/16/23   [provider]  levothyroxine  (SYNTHROID , LEVOTHROID) 125 MCG tablet Take 125 mcg by mouth daily before breakfast.    [provider]  metoprolol  succinate (TOPROL  XL) 25 MG 24 hr tablet Take 1 tablet (25 mg total) by mouth daily. 02/13/24   Aniceto Daphne CROME, NP  mexiletine (MEXITIL ) 150 MG capsule Take 1 capsule (150 mg total) by mouth 2 (two) times daily. 02/13/24   Aniceto Daphne CROME, NP  Multiple Vitamins-Minerals (PRESERVISION AREDS 2 PO) Take 1 capsule by mouth 2 (two) times daily.    [provider]  nitroGLYCERIN  (NITROSTAT ) 0.4 MG SL tablet Place 1 tablet (0.4 mg total) under the tongue every 5 (five) minutes as needed for chest pain (x 3 doses). 02/10/22   Camnitz, Soyla Lunger, MD  pantoprazole  (PROTONIX ) 40 MG tablet Take 1  tablet (40 mg total) by mouth daily. 08/21/19   Camnitz, Soyla Lunger, MD  prednisoLONE  acetate (PRED FORTE ) 1 % ophthalmic suspension Place 1 drop into both eyes in the morning and at bedtime.    [provider]  sildenafil (REVATIO) 20 MG tablet Take 1-5 tablets by mouth daily as needed. 03/04/22   [provider]  traMADol (ULTRAM) 50 MG tablet Take 50 mg by mouth every 6 (six) hours as needed for moderate pain. 01/10/23   [provider]  triamcinolone  ointment (KENALOG ) 0.5 % Apply 1 Application topically 2 (two) times daily.  Mix with CeraVe cream 1:1. 05/23/23   [provider]    Allergies: Gabapentin , Topiramate, Celecoxib, Pitavastatin, Rosuvastatin calcium , and Hydrocodone -acetaminophen     Review of Systems  Updated Vital Signs BP 125/69   Pulse 60   Temp 97.9 F (36.6 C) (Oral)   Resp 17   Ht 5' 7 (1.702 m)   Wt 73 kg   SpO2 99%   BMI 25.21 kg/m   Physical Exam Vitals and nursing note reviewed.  Constitutional:      General: He is not in acute distress.    Appearance: He is well-developed.  HENT:     Head: Normocephalic and atraumatic.     Right Ear: External ear normal.     Left Ear: External ear normal.     Nose: Nose normal.     Mouth/Throat:     Mouth: Mucous membranes are moist.     Pharynx: Oropharynx is clear. No oropharyngeal exudate or posterior oropharyngeal erythema.  Eyes:     Extraocular Movements: Extraocular movements intact.     Conjunctiva/sclera: Conjunctivae normal.     Pupils: Pupils are equal, round, and reactive to light.  Cardiovascular:     Rate and Rhythm: Regular rhythm. Bradycardia present.     Heart sounds: Normal heart sounds.     Comments: Occasionally in bigeminy on the monitor.  During my exam mostly sinus bradycardia at a rate of 58. Pulmonary:     Effort: Pulmonary effort is normal. No respiratory distress.     Breath sounds: Normal breath sounds.  Musculoskeletal:     Cervical back: Normal range of motion and neck supple.  Skin:    General: Skin is warm and dry.  Neurological:     Mental Status: He is alert. Mental status is at baseline.  Psychiatric:        Mood and Affect: Mood normal.        Behavior: Behavior normal.     (all labs ordered are listed, but only abnormal results are displayed) Labs Reviewed  RESP PANEL BY RT-PCR (RSV, FLU A&B, COVID)  [id number]    EKG: EKG Interpretation Date/Time:  Wednesday June 12 2024 11:24:32 EDT Ventricular Rate:  60 PR Interval:  209 QRS Duration:  90 QT Interval:  396 QTC  Calculation: 396 R Axis:   -34  Text Interpretation: Sinus rhythm Multiple ventricular premature complexes Inferior infarct, old Consider anterior infarct Confirmed by Yolande Charleston [phone]) on 06/12/2024 12:01:39 PM  Radiology: CT Chest Wo Contrast Result Date: 06/12/2024 CLINICAL DATA:  Coughing congestion. EXAM: CT CHEST WITHOUT CONTRAST TECHNIQUE: Multidetector CT imaging of the chest was performed following the standard protocol without IV contrast. RADIATION DOSE REDUCTION: This exam was performed according to the departmental dose-optimization program which includes automated exposure control, adjustment of the mA and/or kV according to patient size and/or use of iterative reconstruction technique. COMPARISON:  Chest x-ray from earlier same day.  Chest CT 06/09/2021. FINDINGS: Cardiovascular: The heart size is normal. No substantial pericardial effusion. Coronary artery calcification is evident. Mild atherosclerotic calcification is noted in the wall of the thoracic aorta. Mediastinum/Nodes: No mediastinal lymphadenopathy. No evidence for gross hilar lymphadenopathy although assessment is limited by the lack of intravenous contrast on the current study. Tiny hiatal hernia. The esophagus has normal imaging features. There is no axillary lymphadenopathy. Lungs/Pleura: Scattered bilateral pulmonary nodules are stable in the 3 year interval since prior study consistent with benign etiology such as scarring. No followup imaging is recommended. This includes a 5 mm left lower lobe perifissural nodule which may represent the nodular density seen on the chest x-ray. Stable chronic atelectasis are nodular scarring in the lingula. No focal airspace consolidation. There is no evidence of pleural effusion. Upper Abdomen: Hypodensity in the inferior right liver is stable in the interval consistent with benign etiology. No followup imaging is recommended. The visualized portion of the upper abdomen is otherwise  unremarkable. Musculoskeletal: Thoracolumbar scoliosis. No worrisome lytic or sclerotic osseous abnormality. Sclerotic lesion in the paraspinal right tenth rib is stable since prior. Small sclerotic focus in the posterior right first rib also unchanged since prior. These are probably bone islands and require no further imaging follow-up. IMPRESSION: 1. No acute findings in the chest. Specifically, no findings to explain the patient's history of cough. 2. Scattered bilateral pulmonary nodules are stable in the 3 year interval since prior study consistent with benign etiology such as scarring. No followup imaging is recommended. 3. Tiny hiatal hernia. 4.  Aortic Atherosclerosis (ICD10-I70.0). Electronically Signed   By: Camellia Candle M.D.   On: 06/12/2024 14:08   DG Chest 2 View Result Date: 06/12/2024 CLINICAL DATA:  Coughing congestion. EXAM: CHEST - 2 VIEW COMPARISON:  02/13/2023 FINDINGS: Marked thoracolumbar scoliosis. Cardiopericardial silhouette is at upper limits of normal for size to mildly enlarged. No focal consolidation or pulmonary edema. Subtle nodular density overlies the lateral left mid lung. No substantial pleural effusion. Telemetry leads overlie the chest. IMPRESSION: 1. No acute cardiopulmonary findings. 2. Subtle nodular density overlies the lateral left mid lung. CT chest without contrast recommended to further evaluate. Electronically Signed   By: Camellia Candle M.D.   On: 06/12/2024 12:56     Procedures   Medications Ordered in the ED - No data to display                                  Medical Decision Making Amount and/or Complexity of Data Reviewed Radiology: ordered.  Risk Prescription drug management.   Duane Reyes is a 73 y.o. male with comorbidities that complicate the patient evaluation including PVCs, hypertension hyperlipidemia and sinusitis who presents with URI symptoms  Initial Ddx:  URI, sinusitis, pneumonia  MDM:  Feel the patient likely has a  URI versus sinusitis based on their symptoms.  With the cough we will obtain a chest x-ray.    Plan:  COVID/flu Chest x-ray  ED Summary/Re-evaluation:  Patient reevaluated in the emergency department and was stable.  COVID and flu negative.  Chest x-ray did show a density that recommended CT of the chest.  This was performed which showed stable pulmonary nodules without any other acute abnormality.  Patient is requesting azithromycin and prednisone which was prescribed.  Feel that they are suitable for outpatient workup at this time so we will have them follow-up with their primary doctor in 2  to 3 days.  Bradycardia was likely from bigeminy.  He is not symptomatic and is already being followed by cardiology for his PVCs  This patient presents to the ED for concern of complaints listed in HPI, this involves an extensive number of treatment options, and is a complaint that carries with it a high risk of complications and morbidity. Disposition including potential need for admission considered.   Dispo: DC Home. Return precautions discussed including, but not limited to, those listed in the AVS. Allowed pt time to ask questions which were answered fully prior to dc.  Additional history obtained from significant other Records reviewed Outpatient Clinic Notes I independently reviewed the following imaging with scope of interpretation limited to determining acute life threatening conditions related to emergency care: CT Chest and agree with the radiologist interpretation with the following exceptions: none I personally reviewed and interpreted cardiac monitoring: sinus bradycardia and bigeminy I personally reviewed and interpreted the pt's EKG: see above for interpretation  I have reviewed the patients home medications and made adjustments as needed Social Determinants of health:  Geriatric  Portions of this note were generated with Scientist, clinical (histocompatibility and immunogenetics). Dictation errors may occur despite best  attempts at proofreading.     Final diagnoses:  Acute recurrent sinusitis, unspecified location    ED Discharge Orders          Ordered    azithromycin (ZITHROMAX) 250 MG tablet  Daily        06/12/24 1413    predniSONE (DELTASONE) 20 MG tablet  Daily        06/12/24 1416               Yolande Lamar BROCKS, MD 06/12/24 1447

## 2024-06-12 NOTE — Discharge Instructions (Signed)
 You were seen for your upper respiratory tract infection in the emergency department.   At home, please take the z-pack and prednisone.  Please use over-the-counter cough medication or tea with honey for your cough.  Follow-up with your primary doctor in 2-3 days regarding your visit.  This may be over the phone.  Return immediately to the emergency department if you experience any of the following: Difficulty breathing, or any other concerning symptoms.    Thank you for visiting our Emergency Department. It was a pleasure taking care of you today.

## 2024-06-12 NOTE — ED Triage Notes (Signed)
 Pt via pov from home with nasal congestion and slight cough that started yesterday. Denies fever, aches, sob. Pt a&o x 4; nad noted.

## 2024-06-17 ENCOUNTER — Encounter

## 2024-07-06 ENCOUNTER — Encounter

## 2024-07-08 ENCOUNTER — Ambulatory Visit: Attending: Family Medicine

## 2024-07-08 ENCOUNTER — Encounter

## 2024-07-10 ENCOUNTER — Other Ambulatory Visit: Payer: Self-pay | Admitting: Pulmonary Disease

## 2024-07-18 ENCOUNTER — Encounter

## 2024-08-06 ENCOUNTER — Encounter

## 2024-08-08 ENCOUNTER — Ambulatory Visit: Attending: Family Medicine

## 2024-08-08 ENCOUNTER — Encounter

## 2024-08-19 ENCOUNTER — Encounter

## 2024-08-28 ENCOUNTER — Ambulatory Visit (HOSPITAL_COMMUNITY)
Admission: RE | Admit: 2024-08-28 | Discharge: 2024-08-28 | Disposition: A | Discharge status: Home / Self Care | Source: Ambulatory Visit | Attending: Medical | Admitting: Medical

## 2024-08-28 ENCOUNTER — Other Ambulatory Visit (HOSPITAL_COMMUNITY): Payer: Self-pay | Admitting: Medical

## 2024-08-28 DIAGNOSIS — M79671 Pain in right foot: Secondary | ICD-10-CM | POA: Insufficient documentation

## 2024-09-06 ENCOUNTER — Encounter

## 2024-09-08 ENCOUNTER — Ambulatory Visit

## 2024-09-09 ENCOUNTER — Encounter

## 2024-09-19 ENCOUNTER — Encounter

## 2024-09-23 ENCOUNTER — Encounter (INDEPENDENT_AMBULATORY_CARE_PROVIDER_SITE_OTHER): Admitting: Ophthalmology

## 2024-10-04 ENCOUNTER — Encounter (INDEPENDENT_AMBULATORY_CARE_PROVIDER_SITE_OTHER): Admitting: Ophthalmology

## 2024-10-07 ENCOUNTER — Encounter

## 2024-10-09 ENCOUNTER — Ambulatory Visit

## 2024-10-10 ENCOUNTER — Encounter

## 2024-10-21 ENCOUNTER — Encounter

## 2024-11-09 ENCOUNTER — Ambulatory Visit

## 2024-12-10 ENCOUNTER — Ambulatory Visit

## 2025-01-10 ENCOUNTER — Ambulatory Visit

## 2025-02-03 ENCOUNTER — Encounter (INDEPENDENT_AMBULATORY_CARE_PROVIDER_SITE_OTHER): Admitting: Ophthalmology

## 2025-02-10 ENCOUNTER — Ambulatory Visit

## 2025-03-13 ENCOUNTER — Ambulatory Visit

## 2025-04-13 ENCOUNTER — Ambulatory Visit

## 2025-05-14 ENCOUNTER — Ambulatory Visit

## 2025-06-14 ENCOUNTER — Ambulatory Visit

## 2025-07-15 ENCOUNTER — Ambulatory Visit

## 2025-08-15 ENCOUNTER — Ambulatory Visit

## 2025-09-15 ENCOUNTER — Ambulatory Visit
